# Patient Record
Sex: Male | Born: 1945
Health system: Southern US, Community
[De-identification: ages and names within clinical notes are randomized; demographics above are authoritative.]

## PROBLEM LIST (undated history)

## (undated) DIAGNOSIS — R251 Tremor, unspecified: Secondary | ICD-10-CM

## (undated) DIAGNOSIS — I10 Essential (primary) hypertension: Secondary | ICD-10-CM

## (undated) DIAGNOSIS — E119 Type 2 diabetes mellitus without complications: Secondary | ICD-10-CM

## (undated) DIAGNOSIS — G250 Essential tremor: Secondary | ICD-10-CM

## (undated) HISTORY — DX: Essential (primary) hypertension: I10

## (undated) HISTORY — DX: Essential tremor: G25.0

---

## 2014-08-21 ENCOUNTER — Emergency Department (HOSPITAL_COMMUNITY): Payer: Medicare Other

## 2014-08-21 ENCOUNTER — Encounter (HOSPITAL_COMMUNITY): Payer: Self-pay | Admitting: Emergency Medicine

## 2014-08-21 ENCOUNTER — Emergency Department (HOSPITAL_COMMUNITY)
Admission: EM | Admit: 2014-08-21 | Discharge: 2014-08-21 | Disposition: A | Discharge status: Home / Self Care | Payer: Medicare Other | Attending: Emergency Medicine | Admitting: Emergency Medicine

## 2014-08-21 DIAGNOSIS — L989 Disorder of the skin and subcutaneous tissue, unspecified: Secondary | ICD-10-CM

## 2014-08-21 DIAGNOSIS — Z87891 Personal history of nicotine dependence: Secondary | ICD-10-CM | POA: Insufficient documentation

## 2014-08-21 DIAGNOSIS — Z79899 Other long term (current) drug therapy: Secondary | ICD-10-CM | POA: Diagnosis not present

## 2014-08-21 DIAGNOSIS — Z7982 Long term (current) use of aspirin: Secondary | ICD-10-CM | POA: Diagnosis not present

## 2014-08-21 DIAGNOSIS — K625 Hemorrhage of anus and rectum: Secondary | ICD-10-CM | POA: Insufficient documentation

## 2014-08-21 HISTORY — DX: Tremor, unspecified: R25.1

## 2014-08-21 LAB — CBC WITH DIFFERENTIAL/PLATELET
BASOS PCT: 1 % (ref 0–1)
Basophils Absolute: 0 10*3/uL (ref 0.0–0.1)
Eosinophils Absolute: 0.3 10*3/uL (ref 0.0–0.7)
Eosinophils Relative: 4 % (ref 0–5)
HCT: 41.1 % (ref 39.0–52.0)
Hemoglobin: 13.9 g/dL (ref 13.0–17.0)
LYMPHS PCT: 25 % (ref 12–46)
Lymphs Abs: 1.6 10*3/uL (ref 0.7–4.0)
MCH: 30.7 pg (ref 26.0–34.0)
MCHC: 33.8 g/dL (ref 30.0–36.0)
MCV: 90.7 fL (ref 78.0–100.0)
Monocytes Absolute: 0.6 10*3/uL (ref 0.1–1.0)
Monocytes Relative: 10 % (ref 3–12)
NEUTROS ABS: 3.9 10*3/uL (ref 1.7–7.7)
NEUTROS PCT: 60 % (ref 43–77)
PLATELETS: 144 10*3/uL — AB (ref 150–400)
RBC: 4.53 MIL/uL (ref 4.22–5.81)
RDW: 12.5 % (ref 11.5–15.5)
WBC: 6.4 10*3/uL (ref 4.0–10.5)

## 2014-08-21 LAB — COMPREHENSIVE METABOLIC PANEL
ALT: 28 U/L (ref 17–63)
AST: 23 U/L (ref 15–41)
Albumin: 3.7 g/dL (ref 3.5–5.0)
Alkaline Phosphatase: 67 U/L (ref 38–126)
Anion gap: 7 (ref 5–15)
BILIRUBIN TOTAL: 1.1 mg/dL (ref 0.3–1.2)
BUN: 16 mg/dL (ref 6–20)
CO2: 28 mmol/L (ref 22–32)
Calcium: 8.8 mg/dL — ABNORMAL LOW (ref 8.9–10.3)
Chloride: 106 mmol/L (ref 101–111)
Creatinine, Ser: 0.79 mg/dL (ref 0.61–1.24)
GFR calc Af Amer: >60 mL/min (ref >60)
Glucose, Bld: 105 mg/dL — ABNORMAL HIGH (ref 65–99)
Potassium: 4.1 mmol/L (ref 3.5–5.1)
Sodium: 141 mmol/L (ref 135–145)
Total Protein: 6.6 g/dL (ref 6.5–8.1)

## 2014-08-21 LAB — POC OCCULT BLOOD, ED: Fecal Occult Bld: NEGATIVE

## 2014-08-21 MED ORDER — IOHEXOL 300 MG/ML  SOLN
100.0000 mL | Freq: Once | INTRAMUSCULAR | Status: AC | PRN
  Administered 2014-08-21: 100 mL via INTRAVENOUS

## 2014-08-21 MED ORDER — IOHEXOL 300 MG/ML  SOLN
50.0000 mL | Freq: Once | INTRAMUSCULAR | Status: AC | PRN
  Administered 2014-08-21: 50 mL via ORAL

## 2014-08-21 NOTE — ED Notes (Signed)
Pt reports rectal bleeding intermittently x 1 year, worse over the past 5 years. Pt has not seen PCP.

## 2014-08-21 NOTE — Discharge Instructions (Signed)
Your labs are well within normal limits. Your CT scan is neg for acute changes or problem. There is calcium changes of the aorta. Please see Dr Melina Copa concerning your aorta changes. Please see Dr Oneida Alar for evaluation of your colon. Please return of bleeding not controlled, or any changes or problem.

## 2014-08-21 NOTE — ED Notes (Signed)
Pt drinking PO contrast at this time.

## 2014-08-21 NOTE — ED Provider Notes (Signed)
CSN: [id number]     Arrival date & time 08/21/14  8768 History   First MD Initiated Contact with Patient 08/21/14 [id number]     Chief Complaint  Patient presents with  . Rectal Bleeding     (Consider location/radiation/quality/duration/timing/severity/associated sxs/prior Treatment) HPI Comments: Pt states about 1 year ago he began to notice spots of blood on his  Underclothes, and later noted blood at times when he wipes. This is getting worse over the past 6 weeks. No dizziness, LOC, or pain.  Patient is a 69 y.o. male presenting with hematochezia. The history is provided by the patient and the spouse.  Rectal Bleeding Quality:  Bright red Duration:  12 months Timing:  Intermittent Progression:  Worsening Chronicity:  Chronic Context: not constipation, not diarrhea, not foreign body, not hemorrhoids, not rectal injury and not rectal pain   Relieved by:  Nothing Worsened by:  Nothing tried Ineffective treatments: alcohol dressing. Associated symptoms: no abdominal pain, no dizziness, no fever, no light-headedness, no loss of consciousness and no vomiting   Risk factors: NSAID use   Risk factors: no anticoagulant use and no liver disease     Past Medical History  Diagnosis Date  . Tremors of nervous system    History reviewed. No pertinent past surgical history. History reviewed. No pertinent family history. History  Substance Use Topics  . Smoking status: Former Research scientist (life sciences)  . Smokeless tobacco: Never Used  . Alcohol Use: No    Review of Systems  Constitutional: Negative for fever.  Gastrointestinal: Positive for hematochezia. Negative for vomiting and abdominal pain.       Rectal bleeding.  Neurological: Negative for dizziness, loss of consciousness and light-headedness.  All other systems reviewed and are negative.     Allergies  Review of patient's allergies indicates no known allergies.  Home Medications   Prior to Admission medications   Medication Sig Start Date  End Date Taking? Authorizing Provider  aspirin EC 81 MG tablet Take 81 mg by mouth daily.   Yes Historical Provider, MD  Multiple Vitamin (MULTIVITAMIN WITH MINERALS) TABS tablet Take 1 tablet by mouth daily.   Yes Historical Provider, MD  naproxen sodium (ALEVE) 220 MG tablet Take 440 mg by mouth daily.   Yes Historical Provider, MD  propranolol ER (INDERAL LA) 80 MG 24 hr capsule Take 80 mg by mouth daily.   Yes Historical Provider, MD   BP 149/74 mmHg  Pulse 60  Temp(Src) 97.9 F (36.6 C) (Oral)  Resp 18  Ht 5\' 11"  (1.803 m)  Wt 222 lb (100.699 kg)  BMI 30.98 kg/m2  SpO2 98% Physical Exam  Constitutional: He is oriented to person, place, and time. He appears well-developed and well-nourished.  Non-toxic appearance.  HENT:  Head: Normocephalic.  Right Ear: Tympanic membrane and external ear normal.  Left Ear: Tympanic membrane and external ear normal.  Eyes: EOM and lids are normal. Pupils are equal, round, and reactive to light.  Neck: Normal range of motion. Neck supple. Carotid bruit is not present.  Cardiovascular: Normal rate, regular rhythm, normal heart sounds, intact distal pulses and normal pulses.   Pulmonary/Chest: Breath sounds normal. No respiratory distress.  Abdominal: Soft. Bowel sounds are normal. He exhibits no distension and no mass. There is no tenderness. There is no rebound and no guarding.  Genitourinary: Penis normal. Guaiac negative stool.  No external or internal hemorrhoids. There is a skin lesion at the mid left buttock cheek. No active bleeding. Prostate enlarged, but not tender.  No blood on gloved finger. Stool Neg for Occult blood. No palpable mass.  Musculoskeletal: Normal range of motion. He exhibits no edema.  Lymphadenopathy:       Head (right side): No submandibular adenopathy present.       Head (left side): No submandibular adenopathy present.    He has no cervical adenopathy.  Neurological: He is alert and oriented to person, place, and time.  He has normal strength. No cranial nerve deficit or sensory deficit.  Skin: Skin is warm and dry.  Psychiatric: He has a normal mood and affect. His speech is normal.  Nursing note and vitals reviewed.   ED Course  Pt seen with me by Dr Rogene Houston.  Procedures (including critical care time) Labs Review Labs Reviewed - No data to display  Imaging Review No results found.   EKG Interpretation None      MDM  There no orthostatic blood pressure and pulse changes. Vital signs are well within normal limits. Principal metabolic panel is well within normal limits. Complete blood count is also well within normal limits. A CT scan of the abdomen and pelvis with contrast Reveals cholelithiasis without a couple getting factor. Diffuse aortoiliac calcification without aneurysmal changes is noted. And there is no acute abnormality noted on the CT.  I have discussed these findings of the examination is well as the labs and imaging with the patient and the patient's wife in terms which they understand. I feel that it is safe for the patient to return home. I have asked the patient to see Dr. Oneida Alar for colonoscopy, as the patient has not had one. I have also asked the patient to discuss the calcifications involving the aorta with Dr. Melina Copa his primary physician. The patient is to return to the emergency department immediately if any changes, problems, or concerns. Family is in agreement with this discharge plan.    Final diagnoses:  None    **I have reviewed nursing notes, vital signs, and all appropriate lab and imaging results for this patient.Lily Kocher, PA-C 08/21/14 [place], MD 08/21/14 [phone]

## 2014-08-22 ENCOUNTER — Telehealth: Payer: Self-pay | Admitting: *Deleted

## 2014-08-22 NOTE — Telephone Encounter (Signed)
I called pt no answer LMOM, I did schedule pt for 09/11/14 with Randall Hiss and appt letter mailed

## 2014-08-22 NOTE — Telephone Encounter (Signed)
Pt's wife called stating the ER told the pt to call here and schedule a TCS can we see this pt?. [phone]

## 2014-08-22 NOTE — Telephone Encounter (Signed)
Pt called back and confirmed appt.

## 2014-08-22 NOTE — Telephone Encounter (Signed)
No previous GI in system, seen in ER for rectal bleeding felt to be due to anal lesion and reccommended GI follow-up per ER MD.  Please schedule patient for an available (non-urgent) slot. Thanks!

## 2014-09-11 ENCOUNTER — Encounter: Payer: Self-pay | Admitting: Nurse Practitioner

## 2014-09-11 ENCOUNTER — Ambulatory Visit (INDEPENDENT_AMBULATORY_CARE_PROVIDER_SITE_OTHER): Payer: Medicare Other | Admitting: Nurse Practitioner

## 2014-09-11 ENCOUNTER — Telehealth: Payer: Self-pay

## 2014-09-11 ENCOUNTER — Other Ambulatory Visit: Payer: Self-pay

## 2014-09-11 VITALS — BP 119/66 | HR 55 | Temp 97.5°F | Ht 71.0 in | Wt 225.2 lb

## 2014-09-11 DIAGNOSIS — K625 Hemorrhage of anus and rectum: Secondary | ICD-10-CM | POA: Diagnosis not present

## 2014-09-11 DIAGNOSIS — Z1211 Encounter for screening for malignant neoplasm of colon: Secondary | ICD-10-CM | POA: Diagnosis not present

## 2014-09-11 MED ORDER — NA SULFATE-K SULFATE-MG SULF 17.5-3.13-1.6 GM/177ML PO SOLN: 1.0000 | ORAL | Status: DC

## 2014-09-11 NOTE — Assessment & Plan Note (Signed)
69 year old male presents on recommendation of emergency room physician for outpatient screening colonoscopy as he has never had one before. He is generally asymptomatic from a GI standpoint. Was seen in the emergency room for rectal bleeding which was felt to be due to a found perianal skin lesion. He has not had any recurrent bleeding, no overt GI bleeding, stool negative for heme, labs all within normal limits. At this point we will proceed with initial screening colonoscopy on this gentleman.  Proceed with colonoscopy with Dr. Oneida Alar in the near future. The risks, benefits, and alternatives have been discussed in detail with the patient. They state understanding and desire to proceed.   The patient is not on any anticoagulants, antidepressants, anxiolytics, her chronic pain medications. Denies alcohol and drug use. Conscious sedation should be adequate for this procedure.

## 2014-09-11 NOTE — Telephone Encounter (Signed)
T/C from [employer]  [phone]. Suprep not covered by Ins.

## 2014-09-11 NOTE — Patient Instructions (Signed)
1. We will schedule your procedure for you. 2. Further recommendations to be based on the results of your procedure. 

## 2014-09-11 NOTE — Progress Notes (Signed)
CC'ED TO PCP 

## 2014-09-11 NOTE — Progress Notes (Signed)
    Primary Care Physician:  Octavio Graves, DO Primary Gastroenterologist:  Dr. Oneida Alar  Chief Complaint  Patient presents with  . Follow-up    HPI:   69 year old male presents for follow-up after emergency room visit. The patient was seen for rectal bleeding and was felt on exam by the ER physician to be due to it left by Dr. anal lesion. CMP and CBC were within normal limits, no overt GI bleeding, no anemia, and heme-negative stool. He was recommended to follow up with his regular doctor for continued care as well as GI for colonoscopy as patient has not had one and is currently age 89.  Today he states he has not had any further bleeding since going to the ED. He confirms he has not had a colonoscopy yet. Denies abdominal pain, N/V, hematochezia, melena, fevers of unknown origin, unintentional weight loss, changes in bowel habits. Has a bowel movement daily which is consistent with Bristol scale 4. Denies chest pain, dyspnea, dizziness, lightheadedness, syncope, near syncope. Denies any other upper or lower GI symptoms.  Past Medical History  Diagnosis Date  . Tremors of nervous system     No past surgical history on file.  Current Outpatient Prescriptions  Medication Sig Dispense Refill  . aspirin EC 81 MG tablet Take 81 mg by mouth daily.    . Multiple Vitamin (MULTIVITAMIN WITH MINERALS) TABS tablet Take 1 tablet by mouth daily.    . naproxen sodium (ALEVE) 220 MG tablet Take 440 mg by mouth daily.    . propranolol ER (INDERAL LA) 80 MG 24 hr capsule Take 80 mg by mouth daily.     No current facility-administered medications for this visit.    Allergies as of 09/11/2014  . (No Known Allergies)    No family history on file.  History   Social History  . Marital Status: Unknown    Spouse Name: N/A  . Number of Children: N/A  . Years of Education: N/A   Occupational History  . Not on file.   Social History Main Topics  . Smoking status: Former Research scientist (life sciences)  . Smokeless  tobacco: Never Used  . Alcohol Use: No  . Drug Use: No  . Sexual Activity: Not on file   Other Topics Concern  . Not on file   Social History Narrative    Review of Systems: 10 point ROS negative except as per HPI    Physical Exam: BP 119/66 mmHg  Pulse 55  Temp(Src) 97.5 F (36.4 C)  Ht 5\' 11"  (1.803 m)  Wt 225 lb 3.2 oz (102.15 kg)  BMI 31.42 kg/m2 General:   Alert and oriented. Pleasant and cooperative. Well-nourished and well-developed.  Head:  Normocephalic and atraumatic. Eyes:  Without icterus, sclera clear and conjunctiva pink.  Ears:  Normal auditory acuity. Cardiovascular:  S1, S2 present without murmurs appreciated. Normal pulses noted. Extremities without clubbing or edema. Respiratory:  Clear to auscultation bilaterally. No wheezes, rales, or rhonchi. No distress.  Gastrointestinal:  +BS, rounded, soft, non-tender and non-distended. No HSM noted. No guarding or rebound. No masses appreciated.  Rectal:  Deferred  Neurologic:  Alert and oriented x4;  grossly normal neurologically. Psych:  Alert and cooperative. Normal mood and affect. Heme/Lymph/Immune: No excessive bruising noted.    09/11/2014 8:47 AM

## 2014-09-11 NOTE — Assessment & Plan Note (Signed)
Patient seen in the emergency room for rectal bleeding. Stool card was negative for heme. Physical exam revealed a perianal lesion which was felt to be the source of toilet tissue hematochezia. It was noted however that the patient had never had a colonoscopy and he was subsequently recommended for screening colonoscopy as an outpatient. The patient has not had any recurrent bleeding since being to the emergency room. He is generally asymptomatic from a GI standpoint. No red flag/warning signs or symptoms.

## 2014-09-13 NOTE — Telephone Encounter (Signed)
PT'S WIFE WILL COME BY TO PICK UP SAMPLE

## 2014-09-21 ENCOUNTER — Encounter (HOSPITAL_COMMUNITY): Payer: Self-pay | Admitting: *Deleted

## 2014-09-21 ENCOUNTER — Ambulatory Visit (HOSPITAL_COMMUNITY)
Admission: RE | Admit: 2014-09-21 | Discharge: 2014-09-21 | Disposition: A | Discharge status: Home / Self Care | Payer: Medicare Other | Source: Ambulatory Visit | Attending: Gastroenterology | Admitting: Gastroenterology

## 2014-09-21 ENCOUNTER — Encounter (HOSPITAL_COMMUNITY)
Admission: RE | Disposition: A | Discharge status: Home / Self Care | Payer: Self-pay | Source: Ambulatory Visit | Attending: Gastroenterology

## 2014-09-21 DIAGNOSIS — Z791 Long term (current) use of non-steroidal anti-inflammatories (NSAID): Secondary | ICD-10-CM | POA: Diagnosis not present

## 2014-09-21 DIAGNOSIS — D123 Benign neoplasm of transverse colon: Secondary | ICD-10-CM

## 2014-09-21 DIAGNOSIS — D128 Benign neoplasm of rectum: Secondary | ICD-10-CM | POA: Diagnosis not present

## 2014-09-21 DIAGNOSIS — Z7982 Long term (current) use of aspirin: Secondary | ICD-10-CM | POA: Insufficient documentation

## 2014-09-21 DIAGNOSIS — Z1211 Encounter for screening for malignant neoplasm of colon: Secondary | ICD-10-CM | POA: Insufficient documentation

## 2014-09-21 DIAGNOSIS — Z87891 Personal history of nicotine dependence: Secondary | ICD-10-CM | POA: Diagnosis not present

## 2014-09-21 DIAGNOSIS — K621 Rectal polyp: Secondary | ICD-10-CM | POA: Diagnosis not present

## 2014-09-21 DIAGNOSIS — K648 Other hemorrhoids: Secondary | ICD-10-CM | POA: Diagnosis not present

## 2014-09-21 DIAGNOSIS — Q438 Other specified congenital malformations of intestine: Secondary | ICD-10-CM | POA: Diagnosis not present

## 2014-09-21 HISTORY — PX: COLONOSCOPY: SHX5424

## 2014-09-21 SURGERY — COLONOSCOPY
Anesthesia: Moderate Sedation

## 2014-09-21 MED ORDER — MIDAZOLAM HCL 5 MG/5ML IJ SOLN
INTRAMUSCULAR | Status: DC | PRN
  Administered 2014-09-21 (×2): 2 mg via INTRAVENOUS

## 2014-09-21 MED ORDER — MIDAZOLAM HCL 5 MG/5ML IJ SOLN
INTRAMUSCULAR | Status: AC
  Filled 2014-09-21: qty 10

## 2014-09-21 MED ORDER — MEPERIDINE HCL 100 MG/ML IJ SOLN
INTRAMUSCULAR | Status: DC | PRN
  Administered 2014-09-21 (×2): 25 mg via INTRAVENOUS

## 2014-09-21 MED ORDER — MEPERIDINE HCL 100 MG/ML IJ SOLN
INTRAMUSCULAR | Status: AC
  Filled 2014-09-21: qty 2

## 2014-09-21 MED ORDER — SODIUM CHLORIDE 0.9 % IV SOLN
INTRAVENOUS | Status: DC
  Administered 2014-09-21: 10:00:00 via INTRAVENOUS

## 2014-09-21 MED ORDER — STERILE WATER FOR IRRIGATION IR SOLN
Status: DC | PRN
  Administered 2014-09-21: 12:00:00

## 2014-09-21 NOTE — H&P (Signed)
  Primary Care Physician:  Octavio Graves, DO Primary Gastroenterologist:  Dr. Oneida Alar  Pre-Procedure History & Physical: HPI:  Robert Faulkner is a 69 y.o. male here for [place].  Past Medical History  Diagnosis Date  . Tremors of nervous system    History reviewed. No pertinent past surgical history.  Prior to Admission medications   Medication Sig Start Date End Date Taking? Authorizing Provider  aspirin EC 81 MG tablet Take 81 mg by mouth daily.   Yes Historical Provider, MD  Multiple Vitamin (MULTIVITAMIN WITH MINERALS) TABS tablet Take 1 tablet by mouth daily.   Yes Historical Provider, MD  Na Sulfate-K Sulfate-Mg Sulf (SUPREP BOWEL PREP) SOLN Take 1 kit by mouth as directed. 09/11/14  Yes Danie Binder, MD  naproxen sodium (ALEVE) 220 MG tablet Take 440 mg by mouth daily.   Yes Historical Provider, MD  propranolol ER (INDERAL LA) 80 MG 24 hr capsule Take 80 mg by mouth daily.   Yes Historical Provider, MD   Allergies as of 09/11/2014  . (No Known Allergies)    Family History  Problem Relation Age of Onset  . Colon cancer Neg Hx     History   Social History  . Marital Status: Unknown    Spouse Name: N/A  . Number of Children: N/A  . Years of Education: N/A   Occupational History  . Not on file.   Social History Main Topics  . Smoking status: Former Smoker -- 2.00 packs/day for 11 years    Quit date: 09/20/1977  . Smokeless tobacco: Never Used  . Alcohol Use: No  . Drug Use: No  . Sexual Activity: Not on file   Other Topics Concern  . Not on file   Social History Narrative   Review of Systems: See HPI, otherwise negative ROS  Physical Exam: BP 155/69 mmHg  Pulse 59  Temp(Src) 98.7 F (37.1 C) (Oral)  Resp 10  SpO2 99% General:   Alert,  pleasant and cooperative in NAD Head:  Normocephalic and atraumatic. Neck:  Supple; Lungs:  Clear throughout to auscultation.    Heart:  Regular rate and rhythm. Abdomen:  Soft, nontender and  nondistended. Normal bowel sounds, without guarding, and without rebound.   Neurologic:  Alert and  oriented x4;  grossly normal neurologically.  Impression/Plan:    SCREENING  Plan:  1. TCS TODAY

## 2014-09-21 NOTE — Discharge Instructions (Signed)
You had 2 small polyps removed. You have small internal hemorrhoids and diverticulosis IN YOUR LEFT COLON.   FOLLOW A HIGH FIBER DIET. AVOID ITEMS THAT CAUSE BLOATING. SEE INFO BELOW.  YOUR BIOPSY RESULTS WILL BE AVAILABLE IN MY CHART AFTER AUG 1 AND MY OFFICE WILL CONTACT YOU IN 10-14 DAYS WITH YOUR RESULTS.    Next colonoscopy in 10-15 years IF THE BENEFITS OUTWEIGH THE RISKS.   Colonoscopy Care After Read the instructions outlined below and refer to this sheet in the next week. These discharge instructions provide you with general information on caring for yourself after you leave the hospital. While your treatment has been planned according to the most current medical practices available, unavoidable complications occasionally occur. If you have any problems or questions after discharge, call DR. Keionna Kinnaird, [phone].  ACTIVITY  You may resume your regular activity, but move at a slower pace for the next 24 hours.   Take frequent rest periods for the next 24 hours.   Walking will help get rid of the air and reduce the bloated feeling in your belly (abdomen).   No driving for 24 hours (because of the medicine (anesthesia) used during the test).   You may shower.   Do not sign any important legal documents or operate any machinery for 24 hours (because of the anesthesia used during the test).    NUTRITION  Drink plenty of fluids.   You may resume your normal diet as instructed by your doctor.   Begin with a light meal and progress to your normal diet. Heavy or fried foods are harder to digest and may make you feel sick to your stomach (nauseated).   Avoid alcoholic beverages for 24 hours or as instructed.    MEDICATIONS  You may resume your normal medications.   WHAT YOU CAN EXPECT TODAY  Some feelings of bloating in the abdomen.   Passage of more gas than usual.   Spotting of blood in your stool or on the toilet paper  .  IF YOU HAD POLYPS REMOVED DURING THE  COLONOSCOPY:  Eat a soft diet IF YOU HAVE NAUSEA, BLOATING, ABDOMINAL PAIN, OR VOMITING.    FINDING OUT THE RESULTS OF YOUR TEST Not all test results are available during your visit. DR. Oneida Alar WILL CALL YOU WITHIN 14 DAYS OF YOUR PROCEDUE WITH YOUR RESULTS. Do not assume everything is normal if you have not heard from DR. Brecken Walth, CALL HER OFFICE AT [phone].  SEEK IMMEDIATE MEDICAL ATTENTION AND CALL THE OFFICE: [phone] IF:  You have more than a spotting of blood in your stool.   Your belly is swollen (abdominal distention).   You are nauseated or vomiting.   You have a temperature over 101F.   You have abdominal pain or discomfort that is severe or gets worse throughout the day.  Polyps, Colon  A polyp is extra tissue that grows inside your body. Colon polyps grow in the large intestine. The large intestine, also called the colon, is part of your digestive system. It is a long, hollow tube at the end of your digestive tract where your body makes and stores stool. Most polyps are not dangerous. They are benign. This means they are not cancerous. But over time, some types of polyps can turn into cancer. Polyps that are smaller than a pea are usually not harmful. But larger polyps could someday become or may already be cancerous. To be safe, doctors remove all polyps and test them.   PREVENTION There  is not one sure way to prevent polyps. You might be able to lower your risk of getting them if you:  Eat more fruits and vegetables and less fatty food.   Do not smoke.   Avoid alcohol.   Exercise every day.   Lose weight if you are overweight.   Eating more calcium and folate can also lower your risk of getting polyps. Some foods that are rich in calcium are milk, cheese, and broccoli. Some foods that are rich in folate are chickpeas, kidney beans, and spinach.   High-Fiber Diet A high-fiber diet changes your normal diet to include more whole grains, legumes, fruits, and  vegetables. Changes in the diet involve replacing refined carbohydrates with unrefined foods. The calorie level of the diet is essentially unchanged. The Dietary Reference Intake (recommended amount) for adult males is 38 grams per day. For adult females, it is 25 grams per day. Pregnant and lactating women should consume 28 grams of fiber per day. Fiber is the intact part of a plant that is not broken down during digestion. Functional fiber is fiber that has been isolated from the plant to provide a beneficial effect in the body. PURPOSE  Increase stool bulk.   Ease and regulate bowel movements.   Lower cholesterol.  REDUCE RISK OF COLON CANCER  INDICATIONS THAT YOU NEED MORE FIBER  Constipation and hemorrhoids.   Uncomplicated diverticulosis (intestine condition) and irritable bowel syndrome.   Weight management.   As a protective measure against hardening of the arteries (atherosclerosis), diabetes, and cancer.   GUIDELINES FOR INCREASING FIBER IN THE DIET  Start adding fiber to the diet slowly. A gradual increase of about 5 more grams (2 slices of whole-wheat bread, 2 servings of most fruits or vegetables, or 1 bowl of high-fiber cereal) per day is best. Too rapid an increase in fiber may result in constipation, flatulence, and bloating.   Drink enough water and fluids to keep your urine clear or pale yellow. Water, juice, or caffeine-free drinks are recommended. Not drinking enough fluid may cause constipation.   Eat a variety of high-fiber foods rather than one type of fiber.   Try to increase your intake of fiber through using high-fiber foods rather than fiber pills or supplements that contain small amounts of fiber.   The goal is to change the types of food eaten. Do not supplement your present diet with high-fiber foods, but replace foods in your present diet.   INCLUDE A VARIETY OF FIBER SOURCES  Replace refined and processed grains with whole grains, canned fruits with  fresh fruits, and incorporate other fiber sources. White rice, white breads, and most bakery goods contain little or no fiber.   Brown whole-grain rice, buckwheat oats, and many fruits and vegetables are all good sources of fiber. These include: broccoli, Brussels sprouts, cabbage, cauliflower, beets, sweet potatoes, white potatoes (skin on), carrots, tomatoes, eggplant, squash, berries, fresh fruits, and dried fruits.   Cereals appear to be the richest source of fiber. Cereal fiber is found in whole grains and bran. Bran is the fiber-rich outer coat of cereal grain, which is largely removed in refining. In whole-grain cereals, the bran remains. In breakfast cereals, the largest amount of fiber is found in those with "bran" in their names. The fiber content is sometimes indicated on the label.   You may need to include additional fruits and vegetables each day.   In baking, for 1 cup white flour, you may use the following substitutions:  1 cup whole-wheat flour minus 2 tablespoons.   1/2 cup white flour plus 1/2 cup whole-wheat flour.   Diverticulosis Diverticulosis is a common condition that develops when small pouches (diverticula) form in the wall of the colon. The risk of diverticulosis increases with age. It happens more often in people who eat a low-fiber diet. Most individuals with diverticulosis have no symptoms. Those individuals with symptoms usually experience belly (abdominal) pain, constipation, or loose stools (diarrhea).  HOME CARE INSTRUCTIONS  Increase the amount of fiber in your diet as directed by your caregiver or dietician. This may reduce symptoms of diverticulosis.   Drink at least 6 to 8 glasses of water each day to prevent constipation.   Try not to strain when you have a bowel movement.   Avoiding nuts and seeds to prevent complications is NOT NECESSARY.     FOODS HAVING HIGH FIBER CONTENT INCLUDE:  Fruits. Apple, peach, pear, tangerine, raisins, prunes.    Vegetables. Brussels sprouts, asparagus, broccoli, cabbage, carrot, cauliflower, romaine lettuce, spinach, summer squash, tomato, winter squash, zucchini.   Starchy Vegetables. Baked beans, kidney beans, lima beans, split peas, lentils, potatoes (with skin).   Grains. Whole wheat bread, brown rice, bran flake cereal, plain oatmeal, white rice, shredded wheat, bran muffins.     Hemorrhoids Hemorrhoids are dilated (enlarged) veins around the rectum. Sometimes clots will form in the veins. This makes them swollen and painful. These are called thrombosed hemorrhoids. Causes of hemorrhoids include:  Constipation.   Straining to have a bowel movement.   HEAVY LIFTING  HOME CARE INSTRUCTIONS  Eat a well balanced diet and drink 6 to 8 glasses of water every day to avoid constipation. You may also use a bulk laxative.   Avoid straining to have bowel movements.   Keep anal area dry and clean.   Do not use a donut shaped pillow or sit on the toilet for long periods. This increases blood pooling and pain.   Move your bowels when your body has the urge; this will require less straining and will decrease pain and pressure.

## 2014-09-21 NOTE — Op Note (Signed)
[facility] [address] [place], [address]   COLONOSCOPY PROCEDURE REPORT  PATIENT: Robert Faulkner, Robert Faulkner  MR#: [id number] BIRTHDATE: Aug 22, 1945 , 69  yrs. old GENDER: male ENDOSCOPIST: Danie Binder, MD REFERRED HR:CBULAGT Butler PROCEDURE DATE:  30-Sep-2014 PROCEDURE:   Colonoscopy, screening INDICATIONS:average risk patient for colon cancer. MEDICATIONS: Demerol 50 mg IV and Versed 4 mg IV  DESCRIPTION OF PROCEDURE:    Physical exam was performed.  Informed consent was obtained from the patient after explaining the benefits, risks, and alternatives to procedure.  The patient was connected to monitor and placed in left lateral position. Continuous oxygen was provided by nasal cannula and IV medicine administered through an indwelling cannula.  After administration of sedation and rectal exam, the patients rectum was intubated and the EC-[id number] ([id number])  colonoscope was advanced under direct visualization to the ileum.  The scope was removed slowly by carefully examining the color, texture, anatomy, and integrity mucosa on the way out.  The patient was recovered in endoscopy and discharged home in satisfactory condition. Estimated blood loss is zero unless otherwise noted in this procedure report.    COLON FINDINGS: The colon was redundant.  , There was moderate diverticulosis noted in the descending colon and sigmoid colon with associated tortuosity and muscular hypertrophy.  , Two sessile polyps ranging from 2 to 26mm in size were found in the rectum and at the hepatic flexure.  A polypectomy was performed with cold forceps.  , and Small internal hemorrhoids were found.  PREP QUALITY: good.  CECAL W/D TIME: 11       minutes COMPLICATIONS: None  ENDOSCOPIC IMPRESSION: 1.   The LEFT colon IS SLIGHTLY redundant 2.   Moderate diverticulosis in the descending colon and sigmoid colon 3.   Two polyps REMOVED 4.   Small internal  hemorrhoids  RECOMMENDATIONS: AWAIT BIOPSY HIGH FIBER DIET NEXT TCS IN 10-15 YEARS IF THE BENEFITS OUTWEIGH THE RISKS REFERRAL MADETO DERMATOLLOGY FOR LESION ON BUTTOCK.    _______________________________ eSignedDanie Binder, MD 2014/09/30 2:14 PM   CPT CODES: ICD CODES:  The ICD and CPT codes recommended by this software are interpretations from the data that the clinical staff has captured with the software.  The verification of the translation of this report to the ICD and CPT codes and modifiers is the sole responsibility of the health care institution and practicing physician where this report was generated.  [employer] [place]. will not be held responsible for the validity of the ICD and CPT codes included on this report.  AMA assumes no liability for data contained or not contained herein. CPT is a Designer, television/film set of the [employer].

## 2014-09-24 ENCOUNTER — Other Ambulatory Visit: Payer: Self-pay

## 2014-09-24 DIAGNOSIS — L989 Disorder of the skin and subcutaneous tissue, unspecified: Secondary | ICD-10-CM

## 2014-09-25 ENCOUNTER — Encounter (HOSPITAL_COMMUNITY): Payer: Self-pay | Admitting: Gastroenterology

## 2014-09-28 ENCOUNTER — Telehealth: Payer: Self-pay | Admitting: Gastroenterology

## 2014-09-28 NOTE — Telephone Encounter (Signed)
Please call pt. He had A simple adenoma AND A HYPERPLASTIC POLYP removed.  FOLLOW A HIGH FIBER DIET. AVOID ITEMS THAT CAUSE BLOATING.   Next colonoscopy in 10-15 years IF THE BENEFITS OUTWEIGH THE RISKS.

## 2014-09-28 NOTE — Telephone Encounter (Signed)
Tried to call with no answer  

## 2014-10-01 NOTE — Telephone Encounter (Signed)
Reminder in epic °

## 2014-10-01 NOTE — Telephone Encounter (Signed)
Pt wife called and she is aware of results

## 2014-10-01 NOTE — Telephone Encounter (Signed)
LMOM for a return call. Letter mailed also to call.

## 2015-05-21 DIAGNOSIS — R000 Tachycardia, unspecified: Secondary | ICD-10-CM | POA: Diagnosis not present

## 2015-05-21 DIAGNOSIS — G250 Essential tremor: Secondary | ICD-10-CM | POA: Diagnosis not present

## 2015-05-21 DIAGNOSIS — Z0000 Encounter for general adult medical examination without abnormal findings: Secondary | ICD-10-CM | POA: Diagnosis not present

## 2015-05-21 DIAGNOSIS — M1990 Unspecified osteoarthritis, unspecified site: Secondary | ICD-10-CM | POA: Diagnosis not present

## 2015-07-15 DIAGNOSIS — Z125 Encounter for screening for malignant neoplasm of prostate: Secondary | ICD-10-CM | POA: Diagnosis not present

## 2015-07-15 DIAGNOSIS — R000 Tachycardia, unspecified: Secondary | ICD-10-CM | POA: Diagnosis not present

## 2015-07-15 DIAGNOSIS — Z0000 Encounter for general adult medical examination without abnormal findings: Secondary | ICD-10-CM | POA: Diagnosis not present

## 2015-07-15 DIAGNOSIS — E785 Hyperlipidemia, unspecified: Secondary | ICD-10-CM | POA: Diagnosis not present

## 2015-07-15 DIAGNOSIS — M545 Low back pain: Secondary | ICD-10-CM | POA: Diagnosis not present

## 2015-07-15 DIAGNOSIS — G250 Essential tremor: Secondary | ICD-10-CM | POA: Diagnosis not present

## 2015-08-02 DIAGNOSIS — M545 Low back pain: Secondary | ICD-10-CM | POA: Diagnosis not present

## 2015-08-23 DIAGNOSIS — M7021 Olecranon bursitis, right elbow: Secondary | ICD-10-CM | POA: Diagnosis not present

## 2015-08-23 DIAGNOSIS — M25521 Pain in right elbow: Secondary | ICD-10-CM | POA: Diagnosis not present

## 2015-08-23 DIAGNOSIS — L0390 Cellulitis, unspecified: Secondary | ICD-10-CM | POA: Diagnosis not present

## 2015-08-23 DIAGNOSIS — S59909A Unspecified injury of unspecified elbow, initial encounter: Secondary | ICD-10-CM | POA: Diagnosis not present

## 2015-08-23 DIAGNOSIS — R000 Tachycardia, unspecified: Secondary | ICD-10-CM | POA: Diagnosis not present

## 2015-08-23 DIAGNOSIS — M7989 Other specified soft tissue disorders: Secondary | ICD-10-CM | POA: Diagnosis not present

## 2015-10-18 ENCOUNTER — Encounter: Payer: Self-pay | Admitting: Physician Assistant

## 2015-10-18 ENCOUNTER — Ambulatory Visit (INDEPENDENT_AMBULATORY_CARE_PROVIDER_SITE_OTHER): Payer: Medicare Other | Admitting: Physician Assistant

## 2015-10-18 VITALS — BP 110/64 | HR 66 | Temp 97.3°F | Ht 71.0 in | Wt 223.8 lb

## 2015-10-18 DIAGNOSIS — G250 Essential tremor: Secondary | ICD-10-CM | POA: Diagnosis not present

## 2015-10-18 DIAGNOSIS — M71021 Abscess of bursa, right elbow: Secondary | ICD-10-CM

## 2015-10-18 DIAGNOSIS — M7021 Olecranon bursitis, right elbow: Secondary | ICD-10-CM | POA: Diagnosis not present

## 2015-10-18 DIAGNOSIS — M71029 Abscess of bursa, unspecified elbow: Secondary | ICD-10-CM | POA: Insufficient documentation

## 2015-10-18 DIAGNOSIS — Z6831 Body mass index (BMI) 31.0-31.9, adult: Secondary | ICD-10-CM | POA: Diagnosis not present

## 2015-10-18 DIAGNOSIS — M7020 Olecranon bursitis, unspecified elbow: Secondary | ICD-10-CM | POA: Insufficient documentation

## 2015-10-18 MED ORDER — NAPROXEN SODIUM 220 MG PO TABS: 440.0000 mg | ORAL_TABLET | Freq: Every day | ORAL | Status: DC

## 2015-10-18 MED ORDER — SULFAMETHOXAZOLE-TRIMETHOPRIM 800-160 MG PO TABS: 1.0000 | ORAL_TABLET | Freq: Two times a day (BID) | ORAL | 0 refills | Status: AC

## 2015-10-18 MED ORDER — PROPRANOLOL HCL ER 120 MG PO CP24: 120.0000 mg | ORAL_CAPSULE | Freq: Every day | ORAL | 12 refills | Status: DC

## 2015-10-18 NOTE — Patient Instructions (Signed)
Elbow Bursitis  Elbow bursitis is inflammation of the fluid-filled sac (bursa) between the tip of your elbow bone (olecranon) and your skin. Elbow bursitis may also be called olecranon bursitis.  Normally, the olecranon bursa has only a small amount of fluid in it to cushion and protect your elbow bone. Elbow bursitis causes fluid to build up inside the bursa. Over time, this swelling and inflammation can cause pain when you bend or lean on your elbow.   CAUSES  Elbow bursitis may be caused by:    Elbow injury (acute trauma).   Leaning on hard surfaces for long periods of time.   Infection from an injury that breaks the skin near your elbow.   A bone growth (spur) that forms at the tip of your elbow.   A medical condition that causes inflammation in your body, such as gout or rheumatoid arthritis.   The cause may also be unknown.   SIGNS AND SYMPTOMS   The first sign of elbow bursitis is usually swelling over the tip of your elbow. This can grow to be the size of a golf ball. This may start suddenly or develop gradually. You may also have:   Pain when bending or leaning on your elbow.   Restricted movement of your elbow.   If your bursitis is caused by an infection, symptoms may also include:   Redness, warmth, and tenderness of the elbow.   Drainage of pus from the swollen area over your elbow, if the skin breaks open.  DIAGNOSIS   Your health care provider may be able to diagnose elbow bursitis based on your signs and symptoms, especially if you have recently been injured. Your health care provider will also do a physical exam. This may include:   X-rays to look for a bone spur or a bone fracture.   Draining fluid from the bursa to test it for infection.   Blood tests to rule out gout or rheumatoid arthritis.  TREATMENT   Treatment for elbow bursitis depends on the cause. Treatment may include:   Medicines. These may include:    Over-the-counter medicines to relieve pain and inflammation.     Antibiotic medicines to fight infection.    Injections of anti-inflammatory medicines (steroids).   Wrapping your elbow with a bandage.   Draining fluid from the bursa.   Wearing elbow pads.   If your bursitis does not get better with treatment, surgery may be needed to remove the bursa.   HOME CARE INSTRUCTIONS    Take medicines only as directed by your health care provider.   If you were prescribed an antibiotic medicine, finish all of it even if you start to feel better.   If your bursitis is caused by an injury, rest your elbow and wear your bandage as directed by your health care provider. You may alsoapply ice to the injured area as directed by your health care provider:   Put ice in a plastic bag.   Place a towel between your skin and the bag.   Leave the ice on for 20 minutes, 2-3 times per day.   Avoid any activities that cause elbow pain.   Use elbow pads or elbow wraps to cushion your elbow.  SEEK MEDICAL CARE IF:   You have a fever.    Your symptoms do not get better with treatment.   Your pain or swelling gets worse.   Your elbow pain or swelling goes away and then returns.   You have   drainage of pus from the swollen area over your elbow.     This information is not intended to replace advice given to you by your health care provider. Make sure you discuss any questions you have with your health care provider.     Document Released: 03/11/2006 Document Revised: 03/02/2014 Document Reviewed: 10/18/2013  Elsevier Interactive Patient Education 2016 Elsevier Inc.

## 2015-10-18 NOTE — Progress Notes (Signed)
BP 110/64 (BP Location: Left Arm, Patient Position: Sitting, Cuff Size: Large)   Pulse 66   Temp 97.3 F (36.3 C) (Oral)   Ht 5\' 11"  (1.803 m)   Wt 223 lb 12.8 oz (101.5 kg)   BMI 31.21 kg/m    Subjective:    Patient ID: Robert Faulkner, male    DOB: 03-28-1945, 70 y.o.   MRN: [id number]  HPI: Robert Faulkner is a 70 y.o. male presenting on 10/18/2015 for Joint Swelling (right )   HPI this patient comes in with a recurrence of his olecranon bursitis. At this time there is more redness and tenderness to the specific bursa. He had taken prednisone before and it was greatly resolved after using this. He has continued to work. 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 In addition he notes that his essential tremor has gotten worse. He is currently on propranolol extended release 80 mg. In the past he has tried primidone and caused him to be too drowsy. He prefers not to try that again we discussed the possibility of using at doing a much slower taper in the future. At this time we will try increasing the propranolol to 120 mg.    Relevant past medical, surgical, family and social history reviewed and updated as indicated. Interim medical history since our last visit reviewed. Allergies and medications reviewed and updated. DATA REVIEWED: CHART IN EPIC  Review of Systems  Constitutional: Negative.  Negative for appetite change and fatigue.  HENT: Negative.   Eyes: Negative.  Negative for pain and visual disturbance.  Respiratory: Negative.  Negative for cough, chest tightness, shortness of breath and wheezing.   Cardiovascular: Negative.  Negative for chest pain, palpitations and leg swelling.  Gastrointestinal: Negative.  Negative for abdominal pain, diarrhea, nausea and vomiting.  Endocrine: Negative.   Genitourinary: Negative.   Musculoskeletal: Positive for arthralgias and joint swelling.  Skin: Positive for color change, rash and wound.   Neurological: Negative.  Negative for weakness, numbness and headaches.  Psychiatric/Behavioral: Negative.     Per HPI unless specifically indicated above     Medication List       Accurate as of 10/18/15  9:50 AM. Always use your most recent med list.          aspirin EC 81 MG tablet Take 81 mg by mouth daily.   multivitamin with minerals Tabs tablet Take 1 tablet by mouth daily.   naproxen sodium 220 MG tablet Commonly known as:  ALEVE Take 2 tablets (440 mg total) by mouth daily. TAKE 2 tab BID for the flare up of the bursitis   propranolol ER 120 MG 24 hr capsule Commonly known as:  INDERAL LA Take 1 capsule (120 mg total) by mouth daily.   sulfamethoxazole-trimethoprim 800-160 MG tablet Commonly known as:  BACTRIM DS Take 1 tablet by mouth 2 (two) times daily.          Objective:    BP 110/64 (BP Location: Left Arm, Patient Position: Sitting, Cuff Size: Large)   Pulse 66   Temp 97.3 F (36.3 C) (Oral)   Ht 5\' 11"  (1.803 m)   Wt 223 lb 12.8 oz (101.5 kg)   BMI 31.21 kg/m   No Known Allergies  Wt Readings from Last 3 Encounters:  10/18/15 223 lb 12.8 oz (101.5 kg)  09/11/14 225 lb 3.2 oz (102.2 kg)  08/21/14 222 lb (100.7 kg)    Physical  Exam  Constitutional: He appears well-developed and well-nourished.  HENT:  Head: Normocephalic and atraumatic.  Eyes: Conjunctivae and EOM are normal. Pupils are equal, round, and reactive to light.  Neck: Normal range of motion. Neck supple.  Cardiovascular: Normal rate, regular rhythm and normal heart sounds.   Pulmonary/Chest: Effort normal and breath sounds normal.  Abdominal: Soft. Bowel sounds are normal.  Musculoskeletal: Normal range of motion. He exhibits edema, tenderness and deformity.       Right elbow: He exhibits swelling, effusion and deformity. He exhibits normal range of motion. Tenderness found. Olecranon process tenderness noted.  Neurological: He is alert. He displays tremor.  Skin: Skin is  warm and dry.    Results for orders placed or performed during the hospital encounter of 08/21/14  CBC with Differential  Result Value Ref Range   WBC 6.4 4.0 - 10.5 K/uL   RBC 4.53 4.22 - 5.81 MIL/uL   Hemoglobin 13.9 13.0 - 17.0 g/dL   HCT 41.1 39.0 - 52.0 %   MCV 90.7 78.0 - 100.0 fL   MCH 30.7 26.0 - 34.0 pg   MCHC 33.8 30.0 - 36.0 g/dL   RDW 12.5 11.5 - 15.5 %   Platelets 144 (L) 150 - 400 K/uL   Neutrophils Relative % 60 43 - 77 %   Neutro Abs 3.9 1.7 - 7.7 K/uL   Lymphocytes Relative 25 12 - 46 %   Lymphs Abs 1.6 0.7 - 4.0 K/uL   Monocytes Relative 10 3 - 12 %   Monocytes Absolute 0.6 0.1 - 1.0 K/uL   Eosinophils Relative 4 0 - 5 %   Eosinophils Absolute 0.3 0.0 - 0.7 K/uL   Basophils Relative 1 0 - 1 %   Basophils Absolute 0.0 0.0 - 0.1 K/uL  Comprehensive metabolic panel  Result Value Ref Range   Sodium 141 135 - 145 mmol/L   Potassium 4.1 3.5 - 5.1 mmol/L   Chloride 106 101 - 111 mmol/L   CO2 28 22 - 32 mmol/L   Glucose, Bld 105 (H) 65 - 99 mg/dL   BUN 16 6 - 20 mg/dL   Creatinine, Ser 0.79 0.61 - 1.24 mg/dL   Calcium 8.8 (L) 8.9 - 10.3 mg/dL   Total Protein 6.6 6.5 - 8.1 g/dL   Albumin 3.7 3.5 - 5.0 g/dL   AST 23 15 - 41 U/L   ALT 28 17 - 63 U/L   Alkaline Phosphatase 67 38 - 126 U/L   Total Bilirubin 1.1 0.3 - 1.2 mg/dL   GFR calc non Af Amer >60 >60 mL/min   GFR calc Af Amer >60 >60 mL/min   Anion gap 7 5 - 15  POC occult blood, ED  Result Value Ref Range   Fecal Occult Bld NEGATIVE NEGATIVE      Assessment & Plan:   1. Olecranon bursa abscess, right Rest, compression, ice - sulfamethoxazole-trimethoprim (BACTRIM DS) 800-160 MG tablet; Take 1 tablet by mouth 2 (two) times daily.  Dispense: 20 tablet; Refill: 0 Specific information from [place] given to patient.   2. Olecranon bursitis of right elbow - naproxen sodium (ALEVE) 220 MG tablet; Take 2 tablets (440 mg total) by mouth daily. TAKE 2 tab BID for the flare up of the  bursitis  3. Tremor, essential - propranolol ER (INDERAL LA) 120 MG 24 hr capsule; Take 1 capsule (120 mg total) by mouth daily.  Dispense: 30 capsule; Refill: 12   Continue all other maintenance medications  as listed above.  Follow up plan: Return in about 10 days (around 10/28/2015).  Counseling provided for all of the age appropriate vaccines. No orders of the defined types were placed in this encounter.   Terald Sleeper PA-C [facility] [address]  [place], [place] [address] [phone]   10/18/2015, 9:50 AM

## 2015-10-21 ENCOUNTER — Telehealth: Payer: Self-pay

## 2015-10-21 NOTE — Telephone Encounter (Signed)
Spoke with patient and in

## 2015-10-21 NOTE — Telephone Encounter (Signed)
I will check with the pharmacy to see what the cash price is.

## 2015-10-22 NOTE — Telephone Encounter (Signed)
Spoke with Robert Faulkner at [facility], prescription for Propranolol ER has been changed to 40 mg. Three times a day.  Contacted patient and informed them also.

## 2015-10-29 ENCOUNTER — Ambulatory Visit (INDEPENDENT_AMBULATORY_CARE_PROVIDER_SITE_OTHER): Payer: Medicare Other | Admitting: Physician Assistant

## 2015-10-29 ENCOUNTER — Encounter: Payer: Self-pay | Admitting: Physician Assistant

## 2015-10-29 VITALS — BP 95/52 | HR 57 | Temp 97.4°F | Ht 71.0 in | Wt 220.0 lb

## 2015-10-29 DIAGNOSIS — M71021 Abscess of bursa, right elbow: Secondary | ICD-10-CM | POA: Diagnosis not present

## 2015-10-29 DIAGNOSIS — M7021 Olecranon bursitis, right elbow: Secondary | ICD-10-CM | POA: Diagnosis not present

## 2015-10-29 NOTE — Patient Instructions (Signed)
Olecranon Bursitis With Rehab A bursa is a fluid-filled sac that is located between soft tissues (ligaments, tendons, skin) and bones. The purpose of a bursa is to allow the soft tissue to function smoothly, without friction. The olecranon bursa is located between the back of the elbow (olecranon) and the skin. Olecranon bursitis involves inflammation of this bursa, resulting in pain. SYMPTOMS   Pain, tenderness, swelling, warmth, or redness over the back of the elbow.  Reduced range of motion of the affected elbow.  Sometimes, severe pain with movement of the affected elbow.  Crackling sound (crepitation) when the bursa is moved or touched.  Often, painless swelling of the bursa.  Fever (when infected). CAUSES  Olecranon bursitis is often caused by direct hit (trauma) to the elbow. Less commonly, it is due to overuse and/or strenuous exercise that the elbow is not used to. RISK INCREASES WITH:  Sports that require bending or landing on the elbow (football, volleyball).  Vigorous or repetitive athletic training, or sudden increase or change in activity level (weekend warriors).  Failure to warm up properly before activity.  Poor exercise technique.  Playing on artificial turf. PREVENTION  Avoid injuries and the overuse of muscles whenever possible.  Warm up and stretch properly before activity.  Allow for adequate recovery between workouts.  Maintain physical fitness:  Strength, flexibility, and endurance.  Cardiovascular fitness.  Learn and use proper technique.  Wear properly fitted and padded protective equipment. PROGNOSIS  If treated properly, olecranon bursitis is usually curable within 2 weeks.  RELATED COMPLICATIONS   Longer healing time, if not properly treated or if not given enough time to heal.  Recurring symptoms that result in a chronic problem.  Joint stiffness with permanent limitation of the affected joint's movement.  Infection of the  bursa.  Chronic inflammation or scarring of the bursa. TREATMENT Treatment first involves the use of ice and medicine to reduce pain and inflammation. The use of strengthening and stretching exercises may help reduce pain with activity. These exercises may be performed at home or with a therapist. Elbow pads may be advised to protect the bursa. If symptoms persist despite nonsurgical treatment, a procedure to withdraw fluid from the bursa may be advised. This procedure may be accompanied with an injection of corticosteroids to reduce inflammation. Sometimes, surgery is needed to remove the bursa. MEDICATION  If pain medicine is needed, nonsteroidal anti-inflammatory medicines (aspirin and ibuprofen), or other minor pain relievers (acetaminophen), are often advised.  Do not take pain medicine for 7 days before surgery.  Prescription pain relievers may be given, if your caregiver thinks they are needed. Use only as directed and only as much as you need.  Corticosteroid injections may be given by your caregiver. These injections should be reserved for the most serious cases, because they may only be given a certain number of times. HEAT AND COLD  Cold treatment (icing) should be applied for 10 to 15 minutes every 2 to 3 hours for inflammation and pain, and immediately after activity that aggravates your symptoms. Use ice packs or an ice massage.  Heat treatment may be used before performing stretching and strengthening activities prescribed by your caregiver, physical therapist, or athletic trainer. Use a heat pack or a warm water soak. SEEK IMMEDIATE MEDICAL CARE IF:   Symptoms get worse or do not improve in 2 weeks, despite treatment.  Signs of infection develop, including fever of 102 F (38.9 C), increased pain, redness, warmth, or pus draining from the bursa.    New, unexplained symptoms develop. (Drugs used in treatment may produce side effects.) EXERCISES  RANGE OF MOTION (ROM) AND  STRETCHING EXERCISES - Olecranon Bursitis These exercises may help you when beginning to rehabilitate your injury. Your symptoms may resolve with or without further involvement from your physician, physical therapist or athletic trainer. While completing these exercises, remember:   Restoring tissue flexibility helps normal motion to return to the joints. This allows healthier, less painful movement and activity.  An effective stretch should be held for at least 30 seconds.  A stretch should never be painful. You should only feel a gentle lengthening or release in the stretched tissue. RANGE OF MOTION - Elbow Flexion, Supine  Lie on your back. Extend your right / left arm into the air, bracing it with your opposite hand. Allow your right / left arm to relax.  Let your elbow bend, allowing your hand to fall slowly toward your chest.  You should feel a gentle stretch along the back of your upper arm and elbow. Your physician, physical therapist or athletic trainer may ask you to hold a __________ hand weight to increase the intensity of this stretch.  Hold for __________ seconds. Slowly return your right / left arm to the upright position. Repeat __________ times. Complete this exercise __________ times per day. STRETCH - Elbow Flexors  Lie on a firm bed or countertop on your back. Be sure that you are in a comfortable position which will allow you to relax your arm muscles.  Place a folded towel under your right / left upper arm, so that your elbow and shoulder are at the same height. Extend your arm; your elbow should not rest on the bed or towel  Allow the weight of your hand to straighten your elbow. Keep your arm and chest muscles relaxed. Your caregiver may ask you to increase the intensity of your stretch by adding a small wrist or hand weight.  Hold for __________ seconds. You should feel a stretch on the inside of your elbow. Slowly return to the starting position. Repeat __________  times. Complete this exercise __________ times per day. STRENGTHENING EXERCISES - Olecranon Bursitis These exercises will help you regain your strength. They may resolve your symptoms with or without further involvement from your physician, physical therapist or athletic trainer. While completing these exercises, remember:   Muscles can gain both the endurance and the strength needed for everyday activities through controlled exercises.  Complete these exercises as instructed by your physician, physical therapist or athletic trainer. Increase the resistance and repetitions only as guided by your caregiver.  You may experience muscle soreness or fatigue, but the pain or discomfort you are trying to eliminate should never worsen during these exercises. If this pain does worsen, stop and make certain you are following the directions exactly. If the pain is still present after adjustments, discontinue the exercise until you can discuss the trouble with your caregiver. STRENGTH - Elbow Extensors, Isometric  Stand or sit upright on a firm surface. Place your right / left arm so that your palm faces your stomach, and it is at the height of your waist.  Place your opposite hand on the underside of your forearm. Gently push up as your right / left arm resists. Push as hard as you can with both arms without causing any pain or movement at your right / left elbow. Hold this stationary position for __________ seconds.  Gradually release the tension in both arms. Allow your muscles to   relax completely before repeating. Repeat __________ times. Complete this exercise __________ times per day. STRENGTH - Elbow Flexors, Isometric  Stand or sit upright on a firm surface. Place your right / left arm so that your hand is palm-up and at the height of your waist.  Place your opposite hand on top of your forearm. Gently push down as your right / left arm resists. Push as hard as you can with both arms without causing  any pain or movement at your right / left elbow. Hold this stationary position for __________ seconds.  Gradually release the tension in both arms. Allow your muscles to relax completely before repeating. Repeat __________ times. Complete this exercise __________ times per day. STRENGTH - Elbow Flexors, Supinated  With good posture, stand or sit on a firm chair without armrests. Allow your right / left arm to rest at your side with your palm facing forward.  Holding a __________ weight, or gripping a rubber exercise band or tubing,  bring your hand toward your shoulder.  Allow your muscles to control the resistance as your hand returns to your side. Repeat __________ times. Complete this exercise __________ times per day.  STRENGTH - Elbow Flexors, Neutral  With good posture, stand or sit on a firm chair without armrests. Allow your right / left arm to rest at your side with your thumb facing forward.  Holding a __________weight, or gripping a rubber exercise band or tubing,  bring your hand toward your shoulder.  Allow your muscles to control the resistance as your hand returns to your side. Repeat __________ times. Complete this exercise __________ times per day.  STRENGTH - Elbow Extensors  Lie on your back. Extend your right / left elbow into the air, pointing it toward the ceiling. Brace your arm with your opposite hand.*  Holding a __________ weight in your hand, slowly straighten your right / left elbow.  Allow your muscles to control the weight as your hand returns to its starting position. Repeat __________ times. Complete this exercise __________ times per day. *You may also stand with your elbow overhead and pointed toward the ceiling, supported by your opposite hand. STRENGTH - Elbow Extensors, Dynamic  With good posture, stand, or sit on a firm chair without armrests. Keeping your upper arms at your side, bring both hands up to your right / left shoulder while gripping  a rubber exercise band or tubing. Your right / left hand should be just below the other hand.  Straighten your right / left elbow. Hold for __________ seconds.  Allow your muscles to control the rubber exercise band, as your hand returns to your shoulder. Repeat __________ times. Complete this exercise __________ times per day.   This information is not intended to replace advice given to you by your health care provider. Make sure you discuss any questions you have with your health care provider.   Document Released: 02/09/2005 Document Revised: 06/26/2014 Document Reviewed: 05/24/2008 Elsevier Interactive Patient Education 2016 Else[employer] Inc.  

## 2015-10-29 NOTE — Progress Notes (Signed)
BP (!) 95/52   Pulse (!) 57   Temp 97.4 F (36.3 C) (Oral)   Ht 5\' 11"  (1.803 m)   Wt 220 lb (99.8 kg)   BMI 30.68 kg/m    Subjective:    Patient ID: Robert Faulkner, male    DOB: 11-27-1945, 70 y.o.   MRN: [id number]  HPI: Robert Faulkner is a 70 y.o. male presenting on 10/29/2015 for recheck elbow His right olecranon bursitis is feeling tremendously better. The swelling has gone down. He did have a slight bit of drainage. He has completed his medication. He is able to work without any pain.  Relevant past medical, surgical, family and social history reviewed and updated as indicated. Interim medical history since our last visit reviewed. Allergies and medications reviewed and updated. DATA REVIEWED: CHART IN EPIC  Review of Systems  Constitutional: Negative.  Negative for appetite change and fatigue.  HENT: Negative.   Eyes: Negative.  Negative for pain and visual disturbance.  Respiratory: Negative.  Negative for cough, chest tightness, shortness of breath and wheezing.   Cardiovascular: Negative.  Negative for chest pain, palpitations and leg swelling.  Gastrointestinal: Negative.  Negative for abdominal pain, diarrhea, nausea and vomiting.  Endocrine: Negative.   Genitourinary: Negative.   Musculoskeletal: Negative.   Skin: Positive for wound. Negative for color change and rash.  Neurological: Negative.  Negative for weakness, numbness and headaches.  Psychiatric/Behavioral: Negative.     Per HPI unless specifically indicated above     Medication List       Accurate as of 10/29/15  8:23 AM. Always use your most recent med list.          aspirin EC 81 MG tablet Take 81 mg by mouth daily.   multivitamin with minerals Tabs tablet Take 1 tablet by mouth daily.   naproxen sodium 220 MG tablet Commonly known as:  ALEVE Take 2 tablets (440 mg total) by mouth daily. TAKE 2 tab BID for the flare up of the bursitis   propranolol 40 MG tablet Commonly known as:   INDERAL Take 40 mg by mouth 3 (three) times daily.          Objective:    BP (!) 95/52   Pulse (!) 57   Temp 97.4 F (36.3 C) (Oral)   Ht 5\' 11"  (1.803 m)   Wt 220 lb (99.8 kg)   BMI 30.68 kg/m   No Known Allergies  Wt Readings from Last 3 Encounters:  10/29/15 220 lb (99.8 kg)  10/18/15 223 lb 12.8 oz (101.5 kg)  09/11/14 225 lb 3.2 oz (102.2 kg)    Physical Exam  Constitutional: He appears well-developed and well-nourished. No distress.  HENT:  Head: Normocephalic and atraumatic.  Eyes: Conjunctivae and EOM are normal. Pupils are equal, round, and reactive to light.  Cardiovascular: Normal rate, regular rhythm and normal heart sounds.   Pulmonary/Chest: Effort normal and breath sounds normal. No respiratory distress.  Musculoskeletal: He exhibits edema, tenderness and deformity.  Right olecranon bursa is decreased and drained  Skin: Skin is warm and dry.  Psychiatric: He has a normal mood and affect. His behavior is normal.  Nursing note and vitals reviewed.       Assessment & Plan:   1. Olecranon bursitis of right elbow Continue covered area and keep clean and dry, naproxen as needed  2. Olecranon bursa abscess, right See above   Continue all other maintenance medications as listed above.  Follow up plan: Return in  about 3 months (around 01/28/2016) for yearly check and labs.  Counseling provided for all of the age appropriate vaccines. No orders of the defined types were placed in this encounter.   Terald Sleeper PA-C [facility] [address]  [place], [place] [address] [phone]   10/29/2015, 8:23 AM

## 2015-11-25 ENCOUNTER — Ambulatory Visit (INDEPENDENT_AMBULATORY_CARE_PROVIDER_SITE_OTHER): Payer: Medicare Other

## 2015-11-25 DIAGNOSIS — Z23 Encounter for immunization: Secondary | ICD-10-CM

## 2016-04-15 DIAGNOSIS — H524 Presbyopia: Secondary | ICD-10-CM | POA: Diagnosis not present

## 2016-07-20 IMAGING — CT CT ABD-PELV W/ CM
2 of 5 series · 17 of 46 positions shown, 19 images · IV contrast (Omnipaque 300)
Comparison: None.

CLINICAL DATA: Rectal bleeding

EXAM:
CT ABDOMEN AND PELVIS WITH CONTRAST
TECHNIQUE: Multidetector CT imaging of the abdomen and pelvis was performed
using the standard protocol following bolus administration of
intravenous contrast.
CONTRAST:  50mL OMNIPAQUE IOHEXOL 300 MG/ML SOLN, 100mL OMNIPAQUE
IOHEXOL 300 MG/ML SOLN

[Series 2: abd_pel_with 5.0 b40f · axial · 0.85mm/px · z∈[-458,-28]mm · 14 of 96 slices shown, 16 images]
[im 5/96  soft-tissue]
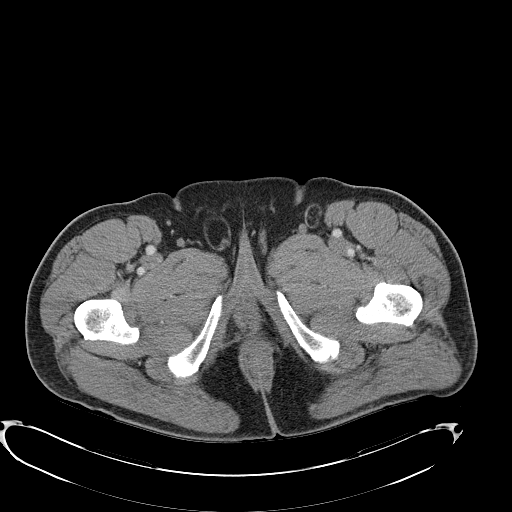
[im 5/96  bone]
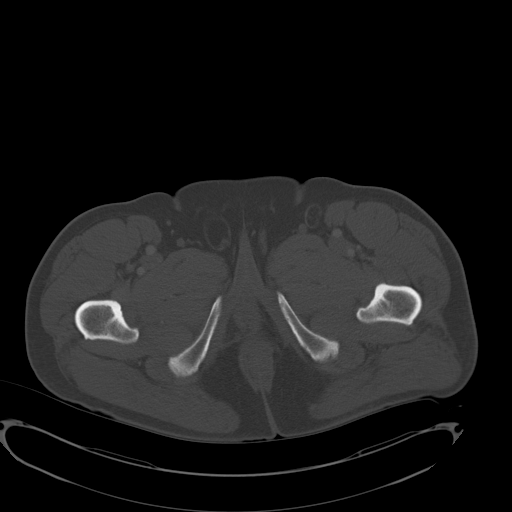
[im 15/96  soft-tissue]
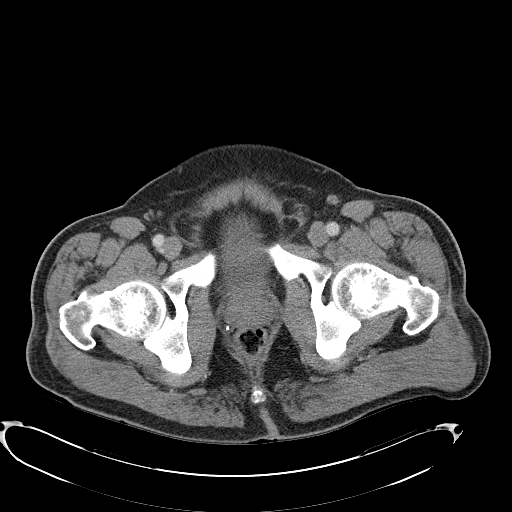
[im 20/96  soft-tissue]
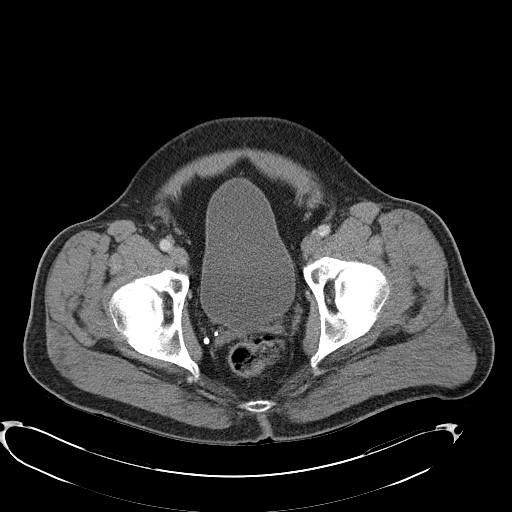
[im 24/96  soft-tissue]
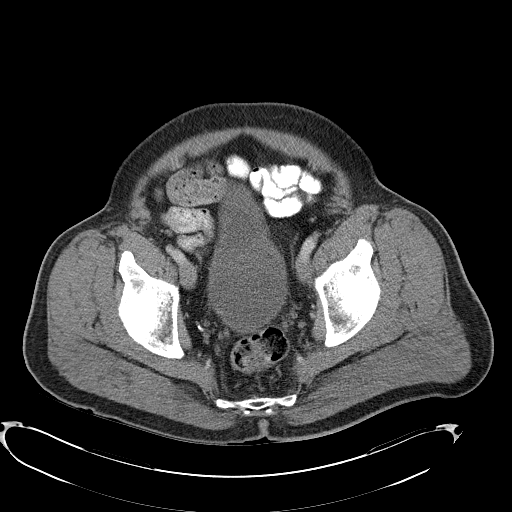
[im 34/96  soft-tissue]
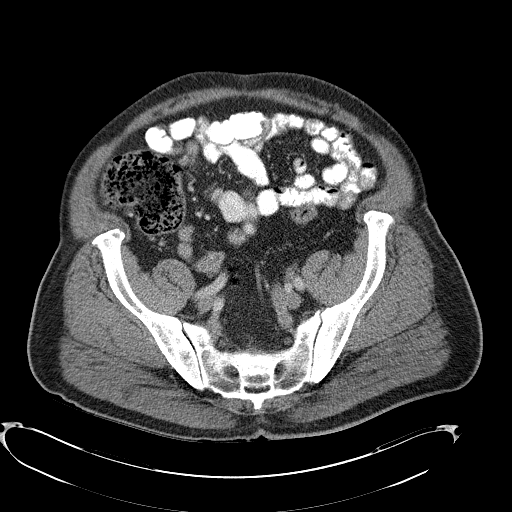
[im 39/96  soft-tissue]
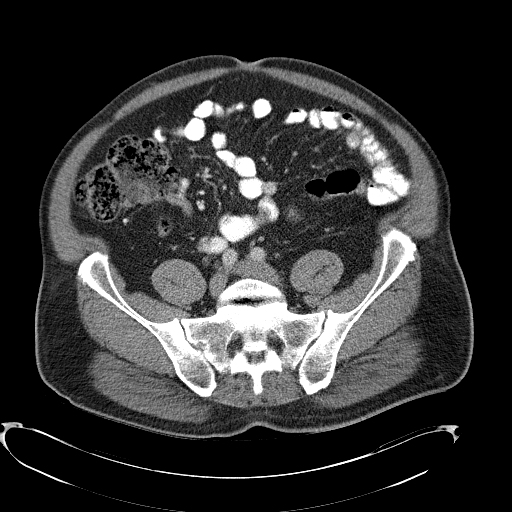
[im 43/96  soft-tissue]
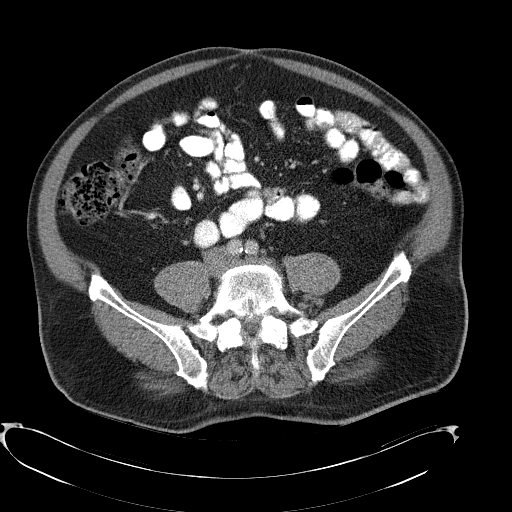
[im 53/96  soft-tissue]
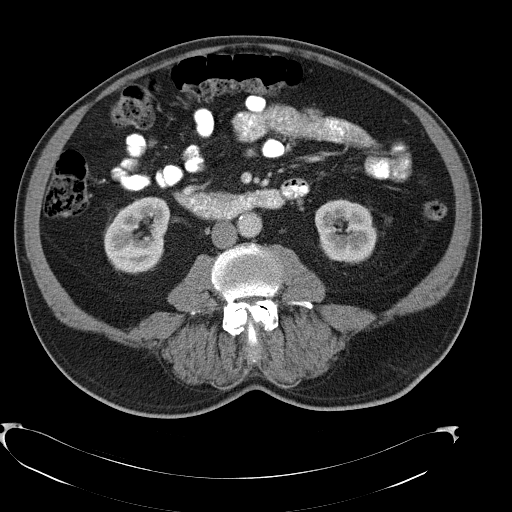
[im 58/96  soft-tissue]
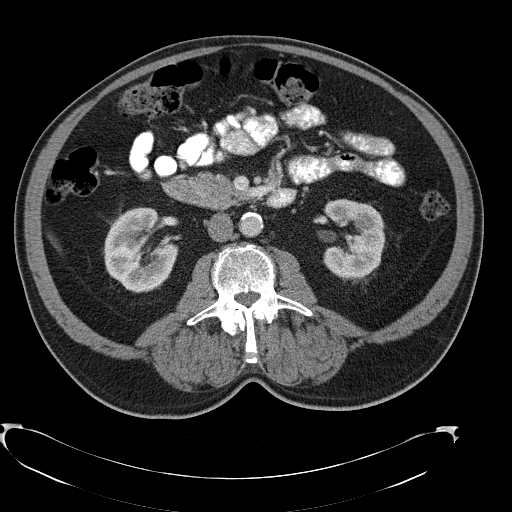
[im 58/96  bone]
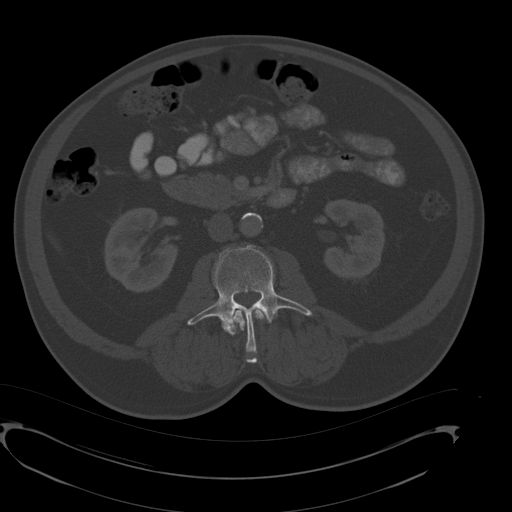
[im 62/96  soft-tissue]
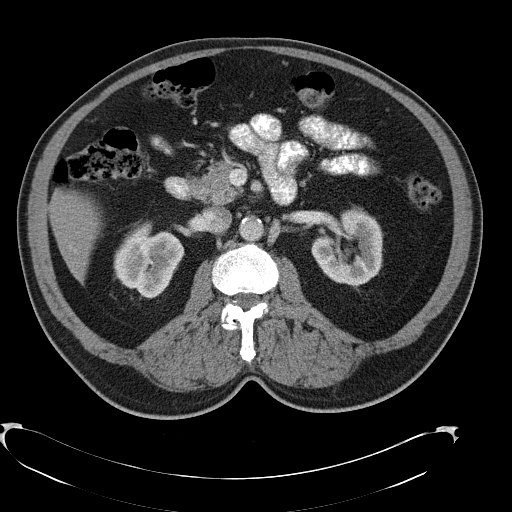
[im 72/96  soft-tissue]
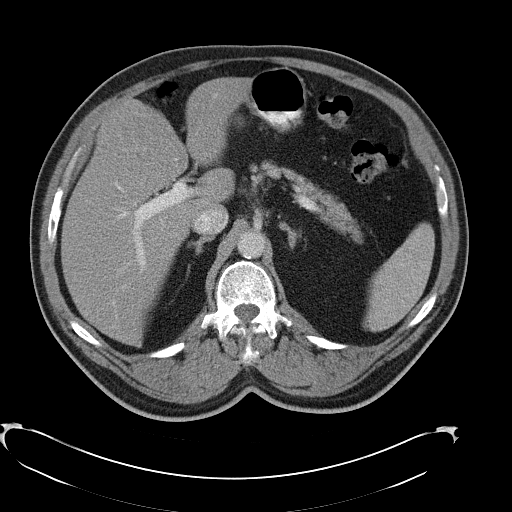
[im 77/96  soft-tissue]
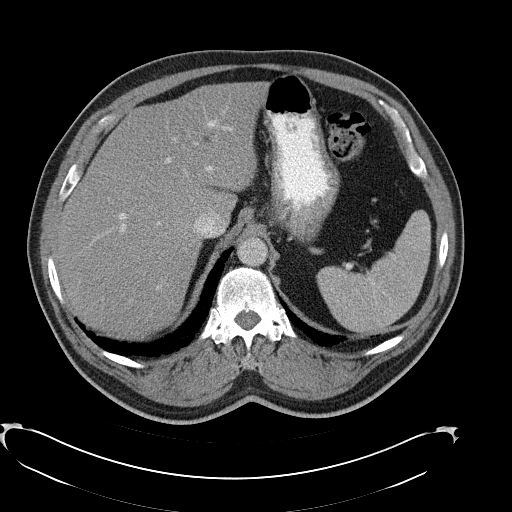
[im 81/96  soft-tissue]
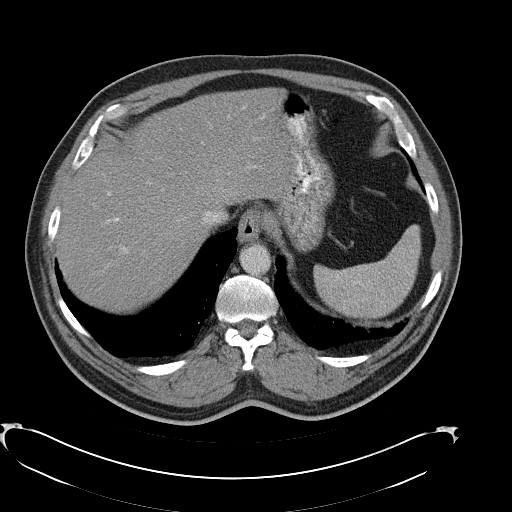
[im 91/96  soft-tissue]
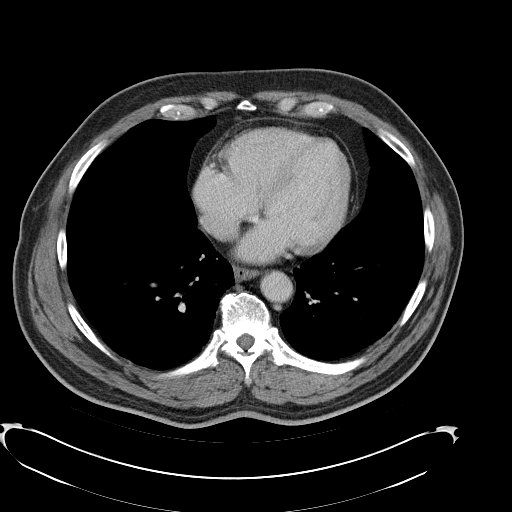

[Series 4: abd_pel_with 3.0 spo cor · coronal · 0.86mm/px · 3 of 100 slices shown]
[im 34/100  soft-tissue]
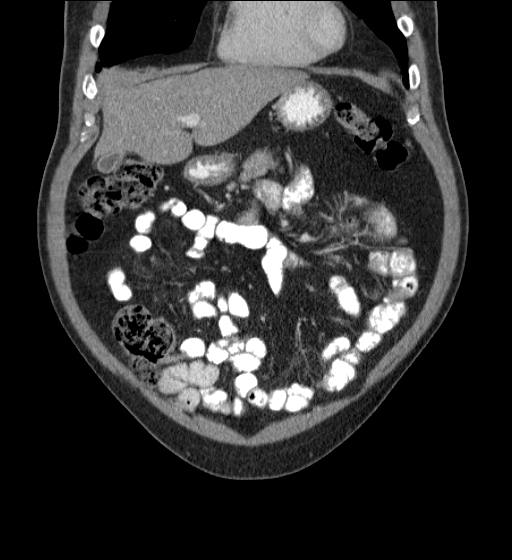
[im 45/100  soft-tissue]
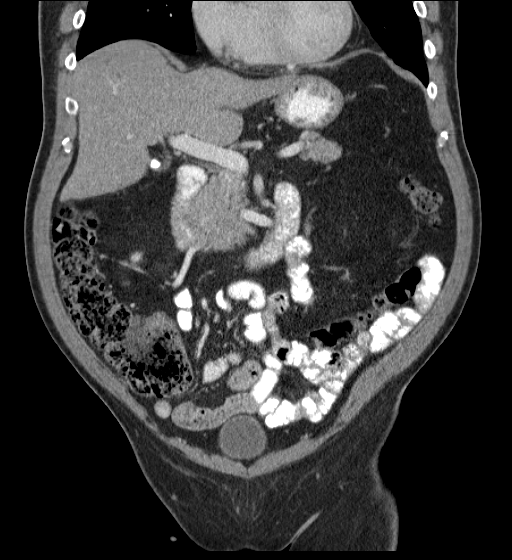
[im 56/100  soft-tissue]
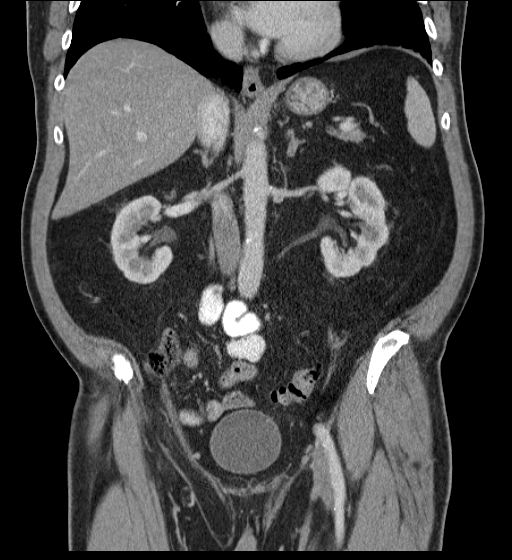

[17 of 46 positions shown; findings below may reference images not displayed]

FINDINGS: Lung bases are free of acute infiltrate or sizable effusion.

The liver is diffusely fatty infiltrated. The gallbladder is
decompressed and there is a single lamellated gallstone in the
inferior aspect of the gallbladder near the neck although not
impacted. The spleen, adrenal glands and pancreas are within normal
limits. The kidneys demonstrate a normal enhancement pattern. No
renal calculi or obstructive changes are noted. The ureters are
within normal limits bilaterally.

Diffuse aortoiliac calcifications are noted without aneurysmal
dilatation. The appendix is retrocecal in location. No inflammatory
changes are identified to suggest appendicitis. Mild diverticular
change is noted without evidence of diverticulitis. The bladder is
well distended. Degenerative changes of lumbar spine are noted.
IMPRESSION: Fatty liver.

Cholelithiasis without complicating factors.

Aortic atherosclerotic change.

No acute abnormality is noted to correspond with the patient's given
clinical history.

## 2016-09-09 ENCOUNTER — Ambulatory Visit (INDEPENDENT_AMBULATORY_CARE_PROVIDER_SITE_OTHER): Payer: Medicare Other | Admitting: *Deleted

## 2016-09-09 VITALS — BP 116/66 | HR 60 | Temp 97.3°F | Ht 71.0 in | Wt 221.0 lb

## 2016-09-09 DIAGNOSIS — Z23 Encounter for immunization: Secondary | ICD-10-CM | POA: Diagnosis not present

## 2016-09-09 DIAGNOSIS — Z0000 Encounter for general adult medical examination without abnormal findings: Secondary | ICD-10-CM

## 2016-09-09 NOTE — Progress Notes (Signed)
The provider is generic and has to be changed in order for me to sign the note. Do you know how to do that?

## 2016-09-09 NOTE — Patient Instructions (Addendum)
Keep Follow up Appointment with Robert Nearing, PA  Look over Advance Directive Packet and bring in copy of Power of Attorney to be scanned into your chart.  Talk with the [place] about hearing Check on pricing of Tetanus vaccine at next visit with Robert Nearing, PA Pneumonia Vaccine done Today    Robert Faulkner , Thank you for taking time to come for your Medicare Wellness Visit. I appreciate your ongoing commitment to your health goals. Please review the following plan we discussed and let me know if I can assist you in the future.   These are the goals we discussed: Goals    . Exercise 3x per week (30 min per time)          Continue to exercise Daily     . Have 3 meals a day          Try lean grilled or baked meats.        This is a list of the screening recommended for you and due dates:  Health Maintenance  Topic Date Due  . Tetanus Vaccine  03/10/1962  . Pneumonia vaccines (1 of 2 - PCV13) 03/10/2008  .  Hepatitis C: One time screening is recommended by Center for Disease Control  (CDC) for  adults born from [birth year] through 1967.   10/28/2016*  . Flu Shot  09/23/2016  . Colon Cancer Screening  09/20/2024  *Topic was postponed. The date shown is not the original due date.    Pneumococcal Conjugate Vaccine suspension for injection What is this medicine? PNEUMOCOCCAL VACCINE (NEU mo KOK al vak SEEN) is a vaccine used to prevent pneumococcus bacterial infections. These bacteria can cause serious infections like pneumonia, meningitis, and blood infections. This vaccine will lower your chance of getting pneumonia. If you do get pneumonia, it can make your symptoms milder and your illness shorter. This vaccine will not treat an infection and will not cause infection. This vaccine is recommended for infants and young children, adults with certain medical conditions, and adults 4 years or older. This medicine may be used for other purposes; ask your health care provider or pharmacist if you  have questions. COMMON BRAND NAME(S): Prevnar, Prevnar 13 What should I tell my health care provider before I take this medicine? They need to know if you have any of these conditions: -bleeding problems -fever -immune system problems -an unusual or allergic reaction to pneumococcal vaccine, diphtheria toxoid, other vaccines, latex, other medicines, foods, dyes, or preservatives -pregnant or trying to get pregnant -breast-feeding How should I use this medicine? This vaccine is for injection into a muscle. It is given by a health care professional. A copy of Vaccine Information Statements will be given before each vaccination. Read this sheet carefully each time. The sheet may change frequently. Talk to your pediatrician regarding the use of this medicine in children. While this drug may be prescribed for children as young as 52 weeks old for selected conditions, precautions do apply. Overdosage: If you think you have taken too much of this medicine contact a poison control center or emergency room at once. NOTE: This medicine is only for you. Do not share this medicine with others. What if I miss a dose? It is important not to miss your dose. Call your doctor or health care professional if you are unable to keep an appointment. What may interact with this medicine? -medicines for cancer chemotherapy -medicines that suppress your immune function -steroid medicines like prednisone or cortisone This list  may not describe all possible interactions. Give your health care provider a list of all the medicines, herbs, non-prescription drugs, or dietary supplements you use. Also tell them if you smoke, drink alcohol, or use illegal drugs. Some items may interact with your medicine. What should I watch for while using this medicine? Mild fever and pain should go away in 3 days or less. Report any unusual symptoms to your doctor or health care professional. What side effects may I notice from receiving  this medicine? Side effects that you should report to your doctor or health care professional as soon as possible: -allergic reactions like skin rash, itching or hives, swelling of the face, lips, or tongue -breathing problems -confused -fast or irregular heartbeat -fever over 102 degrees F -seizures -unusual bleeding or bruising -unusual muscle weakness Side effects that usually do not require medical attention (report to your doctor or health care professional if they continue or are bothersome): -aches and pains -diarrhea -fever of 102 degrees F or less -headache -irritable -loss of appetite -pain, tender at site where injected -trouble sleeping This list may not describe all possible side effects. Call your doctor for medical advice about side effects. You may report side effects to FDA at 1-800-FDA-1088. Where should I keep my medicine? This does not apply. This vaccine is given in a clinic, pharmacy, doctor's office, or other health care setting and will not be stored at home. NOTE: This sheet is a summary. It may not cover all possible information. If you have questions about this medicine, talk to your doctor, pharmacist, or health care provider.  2018 Elsevier/Gold Standard (2013-11-16 10:27:27)

## 2016-09-09 NOTE — Progress Notes (Signed)
Subjective:   Robert Faulkner is a 71 y.o. male who presents for an Initial Medicare Annual Wellness Visit.  Robert Faulkner is retired from [employer] of 45 years.  He currently live at home with his wife and takes care of daily.  He has two sons that lives close by that comes to help take care of his wife.  Robert Faulkner enjoys mowing yard, working on [employer] and doing house work.  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 Robert Faulkner walks one mile daily and eats healthy meals that consist of fruits, vegetables, and protein.  Overall Robert Faulkner feels that his health is the same as it was a year ago.  No Hospitalizations or surgeries in the past year.     Objective:    Today's Vitals   09/09/16 0838  BP: 116/66  Pulse: 60  Temp: (!) 97.3 F (36.3 C)  TempSrc: Oral  Weight: 221 lb (100.2 kg)  Height: 5\' 11"  (1.803 m)   Body mass index is 30.82 kg/m.  Current Medications (verified) Outpatient Encounter Prescriptions as of 09/09/2016  Medication Sig  . aspirin EC 81 MG tablet Take 81 mg by mouth daily.  . Multiple Vitamin (MULTIVITAMIN WITH MINERALS) TABS tablet Take 1 tablet by mouth daily.  . naproxen sodium (ALEVE) 220 MG tablet Take 2 tablets (440 mg total) by mouth daily. TAKE 2 tab BID for the flare up of the bursitis  . propranolol (INDERAL) 40 MG tablet Take 40 mg by mouth 3 (three) times daily.   No facility-administered encounter medications on file as of 09/09/2016.     Allergies (verified) Patient has no known allergies.   History: Past Medical History:  Diagnosis Date  . Essential tremor   . Tremors of nervous system    Past Surgical History:  Procedure Laterality Date  . COLONOSCOPY N/A 09/21/2014   Procedure: COLONOSCOPY;  Surgeon: Danie Binder, MD;  [facility];  Service: Endoscopy;  Laterality: N/A;  1200 - moved to 10:30 - office to notify   Family History  Problem Relation Age of  Onset  . Arthritis Son   . Hyperlipidemia Son   . Hypertension Son   . Colon cancer Neg Hx    Social History   Occupational History  . Not on file.   Social History Main Topics  . Smoking status: Former Smoker    Packs/day: 2.00    Years: 11.00    Quit date: 09/20/1977  . Smokeless tobacco: Never Used  . Alcohol use No  . Drug use: No  . Sexual activity: Not on file   Tobacco Counseling Former Smoker.    Activities of Daily Living In your present state of health, do you have any difficulty performing the following activities: 09/09/2016  Hearing? Y  Vision? N  Difficulty concentrating or making decisions? N  Walking or climbing stairs? N  Dressing or bathing? N  Doing errands, shopping? N  Some recent data might be hidden  Trouble hearing in the left ear.  Will discuss with the [facility].  Immunizations and Health Maintenance Immunization History  Administered Date(s) Administered  . Influenza, High Dose Seasonal PF 11/25/2015  . Pneumococcal Conjugate-13 09/09/2016  Prevnar Given today Health Maintenance Due  Topic Date Due  . TETANUS/TDAP  03/10/1962  . PNA vac Low Risk Adult (1 of 2 - PCV13) 03/10/2008  Prevnar Given today Declines Shingrix Declines Tetanus-due to price. Will check on pricing at next  visit   Patient Care Team: Theodoro Clock as PCP - General (Physician Assistant) Danie Binder, MD as Consulting Physician (Gastroenterology)  Indicate any recent Medical Services you may have received from other than [facility] providers in the past year (date may be approximate).    Assessment:   This is a routine wellness examination for [name].   Hearing/Vision screen Eye Exam Done 03/2016 at [facility] in [place], [place].  Will Call to request copy   Dietary issues and exercise activities discussed: Current Exercise Habits: Home exercise routine, Type of exercise: walking, Time (Minutes): 30, Frequency (Times/Week): 7, Weekly Exercise (Minutes/Week): 210,  Intensity: Mild, Exercise limited by: None identified  Goals    . Exercise 3x per week (30 min per time)          Continue to exercise Daily     . Have 3 meals a day          Try lean grilled or baked meats.       Depression Screen PHQ 2/9 Scores 09/09/2016 10/29/2015 10/18/2015  PHQ - 2 Score 0 0 0    Fall Risk Fall Risk  09/09/2016 10/29/2015 10/18/2015  Falls in the past year? No Yes Yes  Number falls in past yr: - 1 1  Injury with Fall? - Yes Yes  Risk Factor Category  - - High Fall Risk  Follow up - - Falls prevention discussed    Cognitive Function: MMSE - Mini Mental State Exam 09/09/2016  Orientation to time 5  Orientation to Place 5  Registration 3  Attention/ Calculation 4  Recall 2  Language- name 2 objects 2  Language- repeat 1  Language- follow 3 step command 3  Language- read & follow direction 1  Write a sentence 1  Copy design 1  Total score 28  Had Trouble spelling and recalling     Screening Tests Health Maintenance  Topic Date Due  . TETANUS/TDAP  03/10/1962  . PNA vac Low Risk Adult (1 of 2 - PCV13) 03/10/2008  . Hepatitis C Screening  10/28/2016 (Originally 1946/02/08)  . INFLUENZA VACCINE  09/23/2016  . COLONOSCOPY  09/20/2024  Prevnar done today Declines tetanus and shingrix at this time.  Will discuss at next office visit      Plan:  Encouraged Robert Faulkner to take care of himself as he is the caretaker of his wife.  Encouraged him to keep his follow up appointment with PCP.  Appointment made for CPE with Particia Nearing. Encouraged to continue to exercise (Walk, treadmill, strengthening exercises) at least 30 minutes 3 times weekly.  Encouraged to increase lean proteins to diet such as grilled or baked chicken. Prevnar 13 given today.  Declines shingrix and tetanus vaccine at this time.  Will check on pricing of tetanus at next office visit. Encourage to [facility] or Angle Jones, PA  I have personally reviewed and  noted the following in the patient's chart:   . Medical and social history . Use of alcohol, tobacco or illicit drugs  . Current medications and supplements . Functional ability and status . Nutritional status . Physical activity . Advanced directives . List of other physicians . Hospitalizations, surgeries, and ER visits in previous 12 months . Vitals . Screenings to include cognitive, depression, and falls . Referrals and appointments  In addition, I have reviewed and discussed with patient certain preventive protocols, quality metrics, and best practice recommendations. A written personalized care plan for preventive  services as well as general preventive health recommendations were provided to patient.     Wardell Heath, LPN   4/44/5848    .I have reviewed and agree with the above AWV documentation.   Terald Sleeper PA-C [facility] [address]  [place], [place] [address] [phone]

## 2016-09-16 ENCOUNTER — Encounter: Payer: Self-pay | Admitting: Physician Assistant

## 2016-09-16 ENCOUNTER — Encounter: Payer: Medicare Other | Admitting: Physician Assistant

## 2016-09-16 ENCOUNTER — Ambulatory Visit (INDEPENDENT_AMBULATORY_CARE_PROVIDER_SITE_OTHER): Payer: Medicare Other | Admitting: Physician Assistant

## 2016-09-16 VITALS — BP 104/55 | HR 51 | Temp 96.8°F | Ht 71.0 in | Wt 221.8 lb

## 2016-09-16 DIAGNOSIS — Z0000 Encounter for general adult medical examination without abnormal findings: Secondary | ICD-10-CM | POA: Diagnosis not present

## 2016-09-16 MED ORDER — PROPRANOLOL HCL 40 MG PO TABS: 40.0000 mg | ORAL_TABLET | Freq: Three times a day (TID) | ORAL | 3 refills | Status: DC

## 2016-09-16 NOTE — Patient Instructions (Signed)
In a few days you may receive a survey in the mail or online from [employer] regarding your visit with us today. Please take a moment to fill this out. Your feedback is very important to our whole office. It can help us better understand your needs as well as improve your experience and satisfaction. Thank you for taking your time to complete it. We care about you.  Tarina Volk, PA-C  

## 2016-09-16 NOTE — Progress Notes (Signed)
BP (!) 104/55   Pulse (!) 51   Temp (!) 96.8 F (36 C) (Oral)   Ht 5\' 11"  (1.803 m)   Wt 221 lb 12.8 oz (100.6 kg)   BMI 30.93 kg/m    Subjective:    Patient ID: Robert Faulkner, male    DOB: 21-Oct-1945, 71 y.o.   MRN: [id number]  HPI: Robert Faulkner is a 71 y.o. male presenting on 09/16/2016 for Annual Exam  This patient comes in for annual well physical examination. All medications are reviewed today. There are no reports of any problems with the medications. All of the medical conditions are reviewed and updated.  Lab work is reviewed and will be ordered as medically necessary. There are no new problems reported with today's visit.  Patient reports doing well overall.   Relevant past medical, surgical, family and social history reviewed and updated as indicated. Allergies and medications reviewed and updated.  Past Medical History:  Diagnosis Date  . Essential tremor   . Tremors of nervous system     Past Surgical History:  Procedure Laterality Date  . COLONOSCOPY N/A 09/21/2014   Procedure: COLONOSCOPY;  Surgeon: Danie Binder, MD;  [facility];  Service: Endoscopy;  Laterality: N/A;  1200 - moved to 10:30 - office to notify    Review of Systems  Constitutional: Negative.  Negative for appetite change and fatigue.  HENT: Positive for hearing loss.   Eyes: Negative.  Negative for pain and visual disturbance.  Respiratory: Negative.  Negative for cough, chest tightness, shortness of breath and wheezing.   Cardiovascular: Negative.  Negative for chest pain, palpitations and leg swelling.  Gastrointestinal: Negative.  Negative for abdominal pain, diarrhea, nausea and vomiting.  Endocrine: Negative.   Genitourinary: Negative.   Musculoskeletal: Positive for arthralgias.  Skin: Negative.  Negative for color change and rash.  Neurological: Negative.  Negative for weakness, numbness and headaches.  Psychiatric/Behavioral: Negative.     Allergies as of  09/16/2016   No Known Allergies     Medication List       Accurate as of 09/16/16  2:49 PM. Always use your most recent med list.          aspirin EC 81 MG tablet Take 81 mg by mouth daily.   multivitamin with minerals Tabs tablet Take 1 tablet by mouth daily.   naproxen sodium 220 MG tablet Commonly known as:  ALEVE Take 2 tablets (440 mg total) by mouth daily. TAKE 2 tab BID for the flare up of the bursitis   propranolol 40 MG tablet Commonly known as:  INDERAL Take 1 tablet (40 mg total) by mouth 3 (three) times daily.          Objective:    BP (!) 104/55   Pulse (!) 51   Temp (!) 96.8 F (36 C) (Oral)   Ht 5\' 11"  (1.803 m)   Wt 221 lb 12.8 oz (100.6 kg)   BMI 30.93 kg/m   No Known Allergies  Physical Exam  Constitutional: He appears well-developed and well-nourished.  HENT:  Head: Normocephalic and atraumatic.  Eyes: Pupils are equal, round, and reactive to light. Conjunctivae and EOM are normal.  Neck: Normal range of motion. Neck supple.  Cardiovascular: Normal rate, regular rhythm and normal heart sounds.   Pulmonary/Chest: Effort normal and breath sounds normal.  Abdominal: Soft. Bowel sounds are normal.  Musculoskeletal: Normal range of motion. He exhibits deformity.       Left knee: He  exhibits deformity.  Skin: Skin is warm and dry.  Nursing note and vitals reviewed.       Assessment & Plan:   1. Well adult exam Annual labs soon, order to be placed  Current Outpatient Prescriptions:  .  aspirin EC 81 MG tablet, Take 81 mg by mouth daily., Disp: , Rfl:  .  Multiple Vitamin (MULTIVITAMIN WITH MINERALS) TABS tablet, Take 1 tablet by mouth daily., Disp: , Rfl:  .  naproxen sodium (ALEVE) 220 MG tablet, Take 2 tablets (440 mg total) by mouth daily. TAKE 2 tab BID for the flare up of the bursitis, Disp: , Rfl:  .  propranolol (INDERAL) 40 MG tablet, Take 1 tablet (40 mg total) by mouth 3 (three) times daily., Disp: 270 tablet, Rfl: 3  Continue  all other maintenance medications as listed above.  Follow up plan: Return in about 1 year (around 09/16/2017) for well exam.  Educational handout given for [place] PA-C [facility] [address]  [place],  [address] [phone]   09/16/2016, 2:49 PM

## 2016-10-12 DIAGNOSIS — L820 Inflamed seborrheic keratosis: Secondary | ICD-10-CM | POA: Diagnosis not present

## 2016-12-21 ENCOUNTER — Ambulatory Visit (INDEPENDENT_AMBULATORY_CARE_PROVIDER_SITE_OTHER): Payer: Medicare Other

## 2016-12-21 DIAGNOSIS — Z23 Encounter for immunization: Secondary | ICD-10-CM | POA: Diagnosis not present

## 2017-03-15 ENCOUNTER — Ambulatory Visit: Payer: Medicare Other | Admitting: Nurse Practitioner

## 2017-03-15 ENCOUNTER — Encounter: Payer: Self-pay | Admitting: Nurse Practitioner

## 2017-03-15 VITALS — BP 145/74 | HR 49 | Temp 96.8°F | Ht <= 58 in | Wt 224.0 lb

## 2017-03-15 DIAGNOSIS — H8303 Labyrinthitis, bilateral: Secondary | ICD-10-CM | POA: Diagnosis not present

## 2017-03-15 MED ORDER — MECLIZINE HCL 25 MG PO TABS: 25.0000 mg | ORAL_TABLET | Freq: Three times a day (TID) | ORAL | 0 refills | Status: DC | PRN

## 2017-03-15 NOTE — Patient Instructions (Signed)
Labyrinthitis Labyrinthitis is an infection of the inner ear. Your inner ear is a system of tubes and canals (labyrinth). These are filled with fluid. Nerve cells in your inner ear send signals for hearing and balance to your brain. When tiny germs get inside the tubes and canals, they harm the cells that send messages to the brain. This can cause changes in hearing and balance. Follow these instructions at home:  Take medicines only as told by your doctor.  If you were prescribed an antibiotic medicine, finish all of it even if you start to feel better.  Rest as much as possible.  Avoid loud noises and bright lights.  Do not make sudden movements until any dizziness goes away.  Do not drive until your doctor says that you can.  Drink enough fluid to keep your pee (urine) clear or pale yellow.  Work with a physical therapist if you still feel dizzy after several weeks. A therapist can teach you exercises to help you deal with your dizziness.  Keep all follow-up visits as told by your doctor. This is important. Contact a doctor if:  Your symptoms do not get better with medicines.  You do not get better after two weeks.  You have a fever. Get help right away if:  You are very dizzy.  You keep throwing up (vomiting) or keep feeling sick to your stomach (nauseous).  Your hearing gets a lot worse very quickly. This information is not intended to replace advice given to you by your health care provider. Make sure you discuss any questions you have with your health care provider. Document Released: 02/09/2005 Document Revised: 07/18/2015 Document Reviewed: 11/21/2013 Elsevier Interactive Patient Education  2018 Elsevier Inc.  

## 2017-03-15 NOTE — Progress Notes (Signed)
   Subjective:    Patient ID: Robert Faulkner, male    DOB: 1945-06-18, 72 y.o.   MRN: [id number]  HPI  Patient in today accompanied by wife. Patient is c/o dizziness for 5 days. He washed his ears out real good last week and started after that. He has had this in the past but does not usually last this long. Has had some nausea.   Review of Systems  Constitutional: Negative.   HENT: Negative.   Respiratory: Negative.   Cardiovascular: Negative.   Gastrointestinal: Positive for nausea.  Genitourinary: Negative.   Skin: Negative.   Neurological: Positive for dizziness.  Psychiatric/Behavioral: Negative.   All other systems reviewed and are negative.      Objective:   Physical Exam  Constitutional: He is oriented to person, place, and time. He appears well-developed and well-nourished. No distress.  Cardiovascular: Normal rate and regular rhythm.  Pulmonary/Chest: Effort normal and breath sounds normal.  Neurological: He is alert and oriented to person, place, and time.  Skin: Skin is warm.  Psychiatric: He has a normal mood and affect. His behavior is normal. Judgment and thought content normal.   BP (!) 145/74   Pulse (!) 49   Temp (!) 96.8 F (36 C) (Oral)   Ht 1' (0.305 m)   Wt 224 lb (101.6 kg)   HC 71" (180.3 cm)   BMI 1093.68 kg/m         Assessment & Plan:   1. Labyrinthitis of both ears    Force fluids Rise slowly from sitting to standing Meds ordered this encounter  Medications  . meclizine (ANTIVERT) 25 MG tablet    Sig: Take 1 tablet (25 mg total) by mouth 3 (three) times daily as needed for dizziness.    Dispense:  30 tablet    Refill:  0    Order Specific Question:   Supervising Provider    Answer:   Eustaquio Maize [[id number]]   Mary-Margaret Hassell Done, FNP

## 2018-04-19 ENCOUNTER — Telehealth: Payer: Self-pay | Admitting: Physician Assistant

## 2018-04-19 NOTE — Telephone Encounter (Signed)
Pt is scheduled for 2/27

## 2018-04-21 ENCOUNTER — Ambulatory Visit (INDEPENDENT_AMBULATORY_CARE_PROVIDER_SITE_OTHER): Payer: Medicare Other | Admitting: Physician Assistant

## 2018-04-21 ENCOUNTER — Encounter (INDEPENDENT_AMBULATORY_CARE_PROVIDER_SITE_OTHER): Payer: Self-pay

## 2018-04-21 VITALS — BP 113/64 | HR 48 | Ht 71.0 in | Wt 225.0 lb

## 2018-04-21 DIAGNOSIS — Z0000 Encounter for general adult medical examination without abnormal findings: Secondary | ICD-10-CM | POA: Diagnosis not present

## 2018-04-21 DIAGNOSIS — Z23 Encounter for immunization: Secondary | ICD-10-CM

## 2018-04-21 NOTE — Patient Instructions (Addendum)
Please work on your goal of continuing to exercise daily.  Walking is a great option.  At your convenience, please bring a copy of your Advance Directives (Healthcare Power of Attorney and Living Will) to our office to be filed in your medical record.  Please continue to move carefully to avoid falls, and use extra caution when using power tools.   [facility] (tetanus, diptheria, and pertussis) and Shingrix (Shingles) vaccines for no charge there.   Please follow up with Particia Nearing, PA as scheduled.   Thank you for coming in for your Annual Wellness Visit today!   Preventive Care 12 Years and Older, Male Preventive care refers to lifestyle choices and visits with your health care provider that can promote health and wellness. What does preventive care include?   A yearly physical exam. This is also called an annual well check.  Dental exams once or twice a year.  Routine eye exams. Ask your health care provider how often you should have your eyes checked.  Personal lifestyle choices, including: ? Daily care of your teeth and gums. ? Regular physical activity. ? Eating a healthy diet. ? Avoiding tobacco and drug use. ? Limiting alcohol use. ? Practicing safe sex. ? Taking low doses of aspirin every day. ? Taking vitamin and mineral supplements as recommended by your health care provider. What happens during an annual well check? The services and screenings done by your health care provider during your annual well check will depend on your age, overall health, lifestyle risk factors, and family history of disease. Counseling Your health care provider may ask you questions about your:  Alcohol use.  Tobacco use.  Drug use.  Emotional well-being.  Home and relationship well-being.  Sexual activity.  Eating habits.  History of falls.  Memory and ability to understand (cognition).  Work and work Statistician. Screening You may have the  following tests or measurements:  Height, weight, and BMI.  Blood pressure.  Lipid and cholesterol levels. These may be checked every 5 years, or more frequently if you are over 6 years old.  Skin check.  Lung cancer screening. You may have this screening every year starting at age 80 if you have a 30-pack-year history of smoking and currently smoke or have quit within the past 15 years.  Colorectal cancer screening. All adults should have this screening starting at age 64 and continuing until age 29. You will have tests every 1-10 years, depending on your results and the type of screening test. People at increased risk should start screening at an earlier age. Screening tests may include: ? Guaiac-based fecal occult blood testing. ? Fecal immunochemical test (FIT). ? Stool DNA test. ? Virtual colonoscopy. ? Sigmoidoscopy. During this test, a flexible tube with a tiny camera (sigmoidoscope) is used to examine your rectum and lower colon. The sigmoidoscope is inserted through your anus into your rectum and lower colon. ? Colonoscopy. During this test, a long, thin, flexible tube with a tiny camera (colonoscope) is used to examine your entire colon and rectum.  Prostate cancer screening. Recommendations will vary depending on your family history and other risks.  Hepatitis C blood test.  Hepatitis B blood test.  Sexually transmitted disease (STD) testing.  Diabetes screening. This is done by checking your blood sugar (glucose) after you have not eaten for a while (fasting). You may have this done every 1-3 years.  Abdominal aortic aneurysm (AAA) screening. You may need this if  you are a current or former smoker.  Osteoporosis. You may be screened starting at age 36 if you are at high risk. Talk with your health care provider about your test results, treatment options, and if necessary, the need for more tests. Vaccines Your health care provider may recommend certain vaccines, such  as:  Influenza vaccine. This is recommended every year.  Tetanus, diphtheria, and acellular pertussis (Tdap, Td) vaccine. You may need a Td booster every 10 years.  Varicella vaccine. You may need this if you have not been vaccinated.  Zoster vaccine. You may need this after age 85.  Measles, mumps, and rubella (MMR) vaccine. You may need at least one dose of MMR if you were born in 1957 or later. You may also need a second dose.  Pneumococcal 13-valent conjugate (PCV13) vaccine. One dose is recommended after age 60.  Pneumococcal polysaccharide (PPSV23) vaccine. One dose is recommended after age 67.  Meningococcal vaccine. You may need this if you have certain conditions.  Hepatitis A vaccine. You may need this if you have certain conditions or if you travel or work in places where you may be exposed to hepatitis A.  Hepatitis B vaccine. You may need this if you have certain conditions or if you travel or work in places where you may be exposed to hepatitis B.  Haemophilus influenzae type b (Hib) vaccine. You may need this if you have certain risk factors. Talk to your health care provider about which screenings and vaccines you need and how often you need them. This information is not intended to replace advice given to you by your health care provider. Make sure you discuss any questions you have with your health care provider. Document Released: 03/08/2015 Document Revised: 04/01/2017 Document Reviewed: 12/11/2014 Elsevier Interactive Patient Education  2019 [employer].

## 2018-04-21 NOTE — Progress Notes (Signed)
Subjective:   Robert Faulkner is a 73 y.o. male who presents for a subsequent Medicare Annual Wellness Visit.  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  He is married and lives at home with his wife.  He takes care of all of the household chores and is a caregiver for his wife as she is wheelchair bound.  They have 3 sons and 2 grandchildren who live locally.  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  He enjoys cutting wood that his family uses for heating.   Patient Care Team: Theodoro Clock as PCP - General (Physician Assistant) Danie Binder, MD as Consulting Physician (Gastroenterology)  Hospitalizations, surgeries, and ER visits in previous 12 months No hospitalizations, ER visits, or surgeries this past year.   Review of Systems    Patient reports that his overall health is unchanged compared to last year.  Cardiac Risk Factors include: advanced age (>57men, >1 women);male gender;obesity (BMI >30kg/m2)  All other systems negative       Current Medications (verified) Outpatient Encounter Medications as of 04/21/2018  Medication Sig  . aspirin EC 81 MG tablet Take 81 mg by mouth daily.  . meclizine (ANTIVERT) 25 MG tablet Take 1 tablet (25 mg total) by mouth 3 (three) times daily as needed for dizziness.  . Multiple Vitamin (MULTIVITAMIN WITH MINERALS) TABS tablet Take 1 tablet by mouth daily.  . naproxen sodium (ALEVE) 220 MG tablet Take 2 tablets (440 mg total) by mouth daily. TAKE 2 tab BID for the flare up of the bursitis  . propranolol (INDERAL) 40 MG tablet Take 1 tablet (40 mg total) by mouth 3 (three) times daily.   No facility-administered encounter medications on file as of 04/21/2018.     Allergies (verified) Patient has no known allergies.   History: Past Medical History:  Diagnosis Date  . Essential tremor   . Tremors of  nervous system    Past Surgical History:  Procedure Laterality Date  . COLONOSCOPY N/A 09/21/2014   Procedure: COLONOSCOPY;  Surgeon: Danie Binder, MD;  [facility];  Service: Endoscopy;  Laterality: N/A;  1200 - moved to 10:30 - office to notify   Family History  Problem Relation Age of Onset  . Stroke Mother   . Stroke Father   . Arthritis Son   . Hyperlipidemia Son   . Hypertension Son   . Colon cancer Neg Hx    Social History   Socioeconomic History  . Marital status: Married    Spouse name: Not on file  . Number of children: 3  . Years of education: Not on file  . Highest education level: 8th grade  Occupational History  . Occupation: retired    Comment: [employer]  . Financial resource strain: Not hard at all  . Food insecurity:    Worry: Never true    Inability: Never true  . Transportation needs:    Medical: No    Non-medical: No  Tobacco Use  . Smoking status: Former Smoker    Packs/day: 2.00    Years: 11.00    Pack years: 22.00    Last attempt to quit: 09/20/1977    Years since quitting: 40.6  . Smokeless tobacco: Never Used  Substance and Sexual Activity  . Alcohol use: No  . Drug use: No  . Sexual activity:  Not on file  Lifestyle  . Physical activity:    Days per week: 6 days    Minutes per session: 30 min  . Stress: Not at all  Relationships  . Social connections:    Talks on phone: Twice a week    Gets together: More than three times a week    Attends religious service: More than 4 times per year    Active member of club or organization: Yes    Attends meetings of clubs or organizations: More than 4 times per year    Relationship status: Married  Other Topics Concern  . Not on file  Social History Narrative  . Not on file     Clinical Intake:     Pain Score: 0-No pain                  Activities of Daily Living In your present state of health, do you have any difficulty  performing the following activities: 04/21/2018  Hearing? Y  Comment Wears hearing aids   Vision? N  Difficulty concentrating or making decisions? N  Walking or climbing stairs? N  Dressing or bathing? N  Doing errands, shopping? N  Preparing Food and eating ? N  Using the Toilet? N  In the past six months, have you accidently leaked urine? N  Do you have problems with loss of bowel control? N  Managing your Medications? N  Managing your Finances? N  Housekeeping or managing your Housekeeping? N  Some recent data might be hidden     Exercise Current Exercise Habits: Home exercise routine, Type of exercise: walking, Time (Minutes): 30, Frequency (Times/Week): 5, Weekly Exercise (Minutes/Week): 150, Intensity: Moderate  Diet Consumes 3 meals a day and 0 snacks a day.  The patient feels that he mostly follow a Regular diet.  Diet History  Patient states he usually eats eggs, toast, and sausage for breakfast and vegetables for lunch and supper.  He states he does not eat a significant amount of meat- usually a hamburger once per week.  He states he does not enjoy eating very much meat.  Recommended a diet of mostly non-starchy vegetables, fruits, whole grains, and lean proteins when he eats meats.  He states he does all the cooking at home because his wife if wheelchair bound.  They have access to all the food they need.   Depression Screen PHQ 2/9 Scores 04/21/2018 03/15/2017 09/16/2016 09/09/2016 10/29/2015 10/18/2015  PHQ - 2 Score 0 0 0 0 0 0     Fall Risk Fall Risk  04/21/2018 03/15/2017 09/16/2016 09/09/2016 10/29/2015  Falls in the past year? 0 No No No Yes  Number falls in past yr: - - - - 1  Injury with Fall? - - - - Yes  Risk Factor Category  - - - - -  Follow up - - - - -     Objective:    Today's Vitals   04/21/18 0908  BP: 113/64  Pulse: (!) 48  Weight: 225 lb (102.1 kg)  Height: 5\' 11"  (1.803 m)  PainSc: 0-No pain   Body mass index is 31.38 kg/m.  Advanced  Directives 04/21/2018 09/09/2016 09/21/2014 08/21/2014  Does Patient Have a Medical Advance Directive? Yes Yes No Yes  Type of Paramedic of [place];Living will Living will - Living will  Does patient want to make changes to medical advance directive? No - Patient declined Yes (MAU/Ambulatory/Procedural Areas - Information given) - -  Copy  of [place] in Chart? No - copy requested - - No - copy requested  Would patient like information on creating a medical advance directive? - - No - patient declined information -    Hearing/Vision  No hearing or vision deficits noted during visit.  Patient wears hearing aids and glasses.  Cognitive Function: MMSE - Mini Mental State Exam 04/21/2018 09/09/2016  Orientation to time 5 5  Orientation to Place 4 5  Registration 3 3  Attention/ Calculation 4 4  Recall 3 2  Language- name 2 objects 2 2  Language- repeat 1 1  Language- follow 3 step command 3 3  Language- read & follow direction 1 1  Write a sentence 1 1  Copy design 1 1  Total score 28 28           Immunizations and Health Maintenance Immunization History  Administered Date(s) Administered  . Influenza, High Dose Seasonal PF 11/25/2015, 12/21/2016  . Influenza-Unspecified 12/04/2017  . Pneumococcal Conjugate-13 09/09/2016  . Pneumococcal Polysaccharide-23 04/21/2018   Health Maintenance Due  Topic Date Due  . Hepatitis C Screening  08/30/1945  . TETANUS/TDAP  03/10/1962  . PNA vac Low Risk Adult (2 of 2 - PPSV23) 09/09/2017   Patient declined Tdap and Shingrix today. Hepatitis C screening recommended at next visit with Particia Nearing, PA.    Health Maintenance  Topic Date Due  . Hepatitis C Screening  Nov 23, 1945  . TETANUS/TDAP  03/10/1962  . PNA vac Low Risk Adult (2 of 2 - PPSV23) 09/09/2017  . COLONOSCOPY  09/20/2024  . INFLUENZA VACCINE  Completed        Assessment:   This is a routine wellness examination for  [place].    Plan:    Goals    . Exercise 3x per week (30 min per time)     Continue to exercise Daily                  Health Maintenance & Additional Screening Recommendations: Td vaccine Advanced directives: has an advanced directive - a copy HAS NOT been provided.  Lung: Low Dose CT Chest recommended if Age 35-80 years, 30 pack-year currently smoking OR have quit w/in 15years. Patient does not qualify. Hepatitis C Screening recommended: yes   Today's Orders Orders Placed This Encounter  Procedures  . Pneumococcal polysaccharide vaccine 23-valent greater than or equal to 2yo subcutaneous/IM    Keep f/u with Terald Sleeper, PA-C and any other specialty appointments you may have Continue current medications Move carefully to avoid falls. Use assistive devices like a cane or walker if needed. Aim for at least 150 minutes of moderate activity a week. This can be done with chair exercises if necessary. Read or work on puzzles daily Stay connected with friends and family  I have personally reviewed and noted the following in the patient's chart:   . Medical and social history . Use of alcohol, tobacco or illicit drugs  . Current medications and supplements . Functional ability and status . Nutritional status . Physical activity . Advanced directives . List of other physicians . Hospitalizations, surgeries, and ER visits in previous 12 months . Vitals . Screenings to include cognitive, depression, and falls . Referrals and appointments  In addition, I have reviewed and discussed with patient certain preventive protocols, quality metrics, and best practice recommendations. A written personalized care plan for preventive services as well as general preventive health recommendations were provided to patient.  Nolberto Hanlon, RN  04/21/2018

## 2018-05-03 ENCOUNTER — Ambulatory Visit (INDEPENDENT_AMBULATORY_CARE_PROVIDER_SITE_OTHER): Payer: Medicare Other | Admitting: Physician Assistant

## 2018-05-03 VITALS — BP 115/58 | HR 46 | Temp 97.2°F | Ht 71.0 in | Wt 225.8 lb

## 2018-05-03 DIAGNOSIS — Z0000 Encounter for general adult medical examination without abnormal findings: Secondary | ICD-10-CM | POA: Diagnosis not present

## 2018-05-03 DIAGNOSIS — M7021 Olecranon bursitis, right elbow: Secondary | ICD-10-CM

## 2018-05-03 DIAGNOSIS — Z0001 Encounter for general adult medical examination with abnormal findings: Secondary | ICD-10-CM | POA: Diagnosis not present

## 2018-05-03 DIAGNOSIS — G250 Essential tremor: Secondary | ICD-10-CM

## 2018-05-03 MED ORDER — PROPRANOLOL HCL 40 MG PO TABS: 40.0000 mg | ORAL_TABLET | Freq: Three times a day (TID) | ORAL | 3 refills | Status: DC

## 2018-05-03 MED ORDER — NAPROXEN SODIUM 220 MG PO TABS: 440.0000 mg | ORAL_TABLET | Freq: Every day | ORAL | Status: DC

## 2018-05-04 ENCOUNTER — Encounter: Payer: Self-pay | Admitting: Physician Assistant

## 2018-05-04 LAB — CMP14+EGFR
ALT: 25 IU/L (ref 0–44)
AST: 24 IU/L (ref 0–40)
Albumin/Globulin Ratio: 1.6 (ref 1.2–2.2)
Albumin: 4.2 g/dL (ref 3.7–4.7)
Alkaline Phosphatase: 74 IU/L (ref 39–117)
BUN/Creatinine Ratio: 12 (ref 10–24)
BUN: 12 mg/dL (ref 8–27)
Bilirubin Total: 0.7 mg/dL (ref 0.0–1.2)
CALCIUM: 9.7 mg/dL (ref 8.6–10.2)
CO2: 27 mmol/L (ref 20–29)
Chloride: 100 mmol/L (ref 96–106)
Creatinine, Ser: 1.02 mg/dL (ref 0.76–1.27)
GFR calc non Af Amer: 72 mL/min/{1.73_m2} (ref >59)
GFR, EST AFRICAN AMERICAN: 83 mL/min/{1.73_m2} (ref >59)
Globulin, Total: 2.6 g/dL (ref 1.5–4.5)
Glucose: 73 mg/dL (ref 65–99)
Potassium: 5.7 mmol/L — ABNORMAL HIGH (ref 3.5–5.2)
Sodium: 141 mmol/L (ref 134–144)
TOTAL PROTEIN: 6.8 g/dL (ref 6.0–8.5)

## 2018-05-04 LAB — CBC WITH DIFFERENTIAL/PLATELET
BASOS: 1 %
Basophils Absolute: 0.1 10*3/uL (ref 0.0–0.2)
EOS (ABSOLUTE): 0.3 10*3/uL (ref 0.0–0.4)
EOS: 4 %
HEMATOCRIT: 43.1 % (ref 37.5–51.0)
Hemoglobin: 14.7 g/dL (ref 13.0–17.7)
IMMATURE GRANS (ABS): 0 10*3/uL (ref 0.0–0.1)
Immature Granulocytes: 0 %
Lymphocytes Absolute: 1.9 10*3/uL (ref 0.7–3.1)
Lymphs: 28 %
MCH: 29.8 pg (ref 26.6–33.0)
MCHC: 34.1 g/dL (ref 31.5–35.7)
MCV: 87 fL (ref 79–97)
Monocytes Absolute: 0.8 10*3/uL (ref 0.1–0.9)
Monocytes: 11 %
Neutrophils Absolute: 3.9 10*3/uL (ref 1.4–7.0)
Neutrophils: 56 %
PLATELETS: 178 10*3/uL (ref 150–450)
RBC: 4.94 x10E6/uL (ref 4.14–5.80)
RDW: 12.6 % (ref 11.6–15.4)
WBC: 6.9 10*3/uL (ref 3.4–10.8)

## 2018-05-04 LAB — LIPID PANEL
Chol/HDL Ratio: 6.4 ratio — ABNORMAL HIGH (ref 0.0–5.0)
Cholesterol, Total: 235 mg/dL — ABNORMAL HIGH (ref 100–199)
HDL: 37 mg/dL — ABNORMAL LOW (ref >39)
LDL CALC: 159 mg/dL — AB (ref 0–99)
TRIGLYCERIDES: 193 mg/dL — AB (ref 0–149)
VLDL CHOLESTEROL CAL: 39 mg/dL (ref 5–40)

## 2018-05-04 LAB — PSA: PROSTATE SPECIFIC AG, SERUM: 0.7 ng/mL (ref 0.0–4.0)

## 2018-05-04 NOTE — Progress Notes (Signed)
BP (!) 115/58   Pulse (!) 46   Temp (!) 97.2 F (36.2 C) (Oral)   Ht '5\' 11"'  (1.803 m)   Wt 225 lb 12.8 oz (102.4 kg)   BMI 31.49 kg/m    Subjective:    Patient ID: Robert Faulkner, male    DOB: 1945-10-26, 73 y.o.   MRN: [id number]  HPI: Robert Faulkner is a 73 y.o. male presenting on 05/03/2018 for Annual Exam and Medication Refill  This patient comes in for annual well physical examination. All medications are reviewed today. There are no reports of any problems with the medications. All of the medical conditions are reviewed and updated.  Lab work is reviewed and will be ordered as medically necessary. There are no new problems reported with today's visit.  Patient reports doing well overall.  His wife has become quite invalid.  She has multiple severe arthritis joints some have been replaced.  She had a fall about 4 months ago and has had a lot of difficulty moving around since then.  Past Medical History:  Diagnosis Date  . Essential tremor   . Tremors of nervous system    Relevant past medical, surgical, family and social history reviewed and updated as indicated. Interim medical history since our last visit reviewed. Allergies and medications reviewed and updated. DATA REVIEWED: CHART IN EPIC  Family History reviewed for pertinent findings.  Review of Systems  Constitutional: Negative.  Negative for appetite change and fatigue.  HENT: Negative.   Eyes: Negative.  Negative for pain and visual disturbance.  Respiratory: Negative.  Negative for cough, chest tightness, shortness of breath and wheezing.   Cardiovascular: Negative.  Negative for chest pain, palpitations and leg swelling.  Gastrointestinal: Negative.  Negative for abdominal pain, diarrhea, nausea and vomiting.  Endocrine: Negative.   Genitourinary: Negative.   Musculoskeletal: Negative.   Skin: Negative.  Negative for color change and rash.  Neurological: Negative.  Negative for weakness, numbness and  headaches.  Psychiatric/Behavioral: Negative.     Allergies as of 05/03/2018   No Known Allergies     Medication List       Accurate as of May 03, 2018 11:59 PM. Always use your most recent med list.        aspirin EC 81 MG tablet Take 81 mg by mouth daily.   meclizine 25 MG tablet Commonly known as:  ANTIVERT Take 1 tablet (25 mg total) by mouth 3 (three) times daily as needed for dizziness.   multivitamin with minerals Tabs tablet Take 1 tablet by mouth daily.   naproxen sodium 220 MG tablet Commonly known as:  Aleve Take 2 tablets (440 mg total) by mouth daily. TAKE 2 tab BID for the flare up of the bursitis   propranolol 40 MG tablet Commonly known as:  INDERAL Take 1 tablet (40 mg total) by mouth 3 (three) times daily.          Objective:    BP (!) 115/58   Pulse (!) 46   Temp (!) 97.2 F (36.2 C) (Oral)   Ht '5\' 11"'  (1.803 m)   Wt 225 lb 12.8 oz (102.4 kg)   BMI 31.49 kg/m   No Known Allergies  Wt Readings from Last 3 Encounters:  05/03/18 225 lb 12.8 oz (102.4 kg)  04/21/18 225 lb (102.1 kg)  03/15/17 224 lb (101.6 kg)    Physical Exam Constitutional:      Appearance: He is well-developed.  HENT:     Head:  Normocephalic and atraumatic.  Eyes:     Conjunctiva/sclera: Conjunctivae normal.     Pupils: Pupils are equal, round, and reactive to light.  Neck:     Musculoskeletal: Normal range of motion and neck supple.  Cardiovascular:     Rate and Rhythm: Normal rate and regular rhythm.     Heart sounds: Normal heart sounds.  Pulmonary:     Effort: Pulmonary effort is normal.     Breath sounds: Normal breath sounds.  Abdominal:     General: Bowel sounds are normal.     Palpations: Abdomen is soft.  Musculoskeletal: Normal range of motion.  Skin:    General: Skin is warm and dry.  Neurological:     Mental Status: He is oriented to person, place, and time.     Motor: Tremor present.     Comments: Hands, mild     Results for orders  placed or performed in visit on 05/03/18  CBC with Differential/Platelet  Result Value Ref Range   WBC 6.9 3.4 - 10.8 x10E3/uL   RBC 4.94 4.14 - 5.80 x10E6/uL   Hemoglobin 14.7 13.0 - 17.7 g/dL   Hematocrit 43.1 37.5 - 51.0 %   MCV 87 79 - 97 fL   MCH 29.8 26.6 - 33.0 pg   MCHC 34.1 31.5 - 35.7 g/dL   RDW 12.6 11.6 - 15.4 %   Platelets 178 150 - 450 x10E3/uL   Neutrophils 56 Not Estab. %   Lymphs 28 Not Estab. %   Monocytes 11 Not Estab. %   Eos 4 Not Estab. %   Basos 1 Not Estab. %   Neutrophils Absolute 3.9 1.4 - 7.0 x10E3/uL   Lymphocytes Absolute 1.9 0.7 - 3.1 x10E3/uL   Monocytes Absolute 0.8 0.1 - 0.9 x10E3/uL   EOS (ABSOLUTE) 0.3 0.0 - 0.4 x10E3/uL   Basophils Absolute 0.1 0.0 - 0.2 x10E3/uL   Immature Granulocytes 0 Not Estab. %   Immature Grans (Abs) 0.0 0.0 - 0.1 x10E3/uL  CMP14+EGFR  Result Value Ref Range   Glucose 73 65 - 99 mg/dL   BUN 12 8 - 27 mg/dL   Creatinine, Ser 1.02 0.76 - 1.27 mg/dL   GFR calc non Af Amer 72 >59 mL/min/1.73   GFR calc Af Amer 83 >59 mL/min/1.73   BUN/Creatinine Ratio 12 10 - 24   Sodium 141 134 - 144 mmol/L   Potassium 5.7 (H) 3.5 - 5.2 mmol/L   Chloride 100 96 - 106 mmol/L   CO2 27 20 - 29 mmol/L   Calcium 9.7 8.6 - 10.2 mg/dL   Total Protein 6.8 6.0 - 8.5 g/dL   Albumin 4.2 3.7 - 4.7 g/dL   Globulin, Total 2.6 1.5 - 4.5 g/dL   Albumin/Globulin Ratio 1.6 1.2 - 2.2   Bilirubin Total 0.7 0.0 - 1.2 mg/dL   Alkaline Phosphatase 74 39 - 117 IU/L   AST 24 0 - 40 IU/L   ALT 25 0 - 44 IU/L  Lipid panel  Result Value Ref Range   Cholesterol, Total 235 (H) 100 - 199 mg/dL   Triglycerides 193 (H) 0 - 149 mg/dL   HDL 37 (L) >39 mg/dL   VLDL Cholesterol Cal 39 5 - 40 mg/dL   LDL Calculated 159 (H) 0 - 99 mg/dL   Chol/HDL Ratio 6.4 (H) 0.0 - 5.0 ratio  PSA  Result Value Ref Range   Prostate Specific Ag, Serum 0.7 0.0 - 4.0 ng/mL      Assessment & Plan:  1. Well adult exam - CBC with Differential/Platelet - CMP14+EGFR - Lipid  panel - PSA  2. Olecranon bursitis of right elbow - naproxen sodium (ALEVE) 220 MG tablet; Take 2 tablets (440 mg total) by mouth daily. TAKE 2 tab BID for the flare up of the bursitis  3. Tremor, essential - propranolol (INDERAL) 40 MG tablet; Take 1 tablet (40 mg total) by mouth 3 (three) times daily.  Dispense: 270 tablet; Refill: 3   Continue all other maintenance medications as listed above.  Follow up plan: No follow-ups on file.  Educational handout given for [place] PA-C [place] [address]  [place], [place] [address] [phone]   05/04/2018, 10:04 AM

## 2018-06-21 NOTE — Progress Notes (Signed)
REVIEWED-NO ADDITIONAL RECOMMENDATIONS. 

## 2018-12-05 DIAGNOSIS — B078 Other viral warts: Secondary | ICD-10-CM | POA: Diagnosis not present

## 2018-12-05 DIAGNOSIS — L820 Inflamed seborrheic keratosis: Secondary | ICD-10-CM | POA: Diagnosis not present

## 2018-12-07 ENCOUNTER — Ambulatory Visit: Payer: Medicare Other

## 2018-12-28 ENCOUNTER — Ambulatory Visit (INDEPENDENT_AMBULATORY_CARE_PROVIDER_SITE_OTHER): Payer: Medicare Other

## 2018-12-28 ENCOUNTER — Other Ambulatory Visit: Payer: Self-pay

## 2018-12-28 DIAGNOSIS — Z23 Encounter for immunization: Secondary | ICD-10-CM

## 2019-01-14 DIAGNOSIS — J029 Acute pharyngitis, unspecified: Secondary | ICD-10-CM | POA: Diagnosis not present

## 2019-01-14 DIAGNOSIS — J988 Other specified respiratory disorders: Secondary | ICD-10-CM | POA: Diagnosis not present

## 2019-01-14 DIAGNOSIS — R509 Fever, unspecified: Secondary | ICD-10-CM | POA: Diagnosis not present

## 2019-01-18 ENCOUNTER — Other Ambulatory Visit: Payer: Self-pay | Admitting: Physician Assistant

## 2019-01-18 MED ORDER — PREDNISONE 10 MG (21) PO TBPK: ORAL_TABLET | ORAL | 0 refills | Status: DC

## 2019-01-21 ENCOUNTER — Inpatient Hospital Stay (HOSPITAL_COMMUNITY)
Admission: EM | Admit: 2019-01-21 | Discharge: 2019-01-26 | DRG: 177 | Disposition: A | Discharge status: Home Health | Payer: Medicare Other | Attending: Internal Medicine | Admitting: Internal Medicine

## 2019-01-21 ENCOUNTER — Other Ambulatory Visit: Payer: Self-pay

## 2019-01-21 ENCOUNTER — Encounter (HOSPITAL_COMMUNITY): Payer: Self-pay

## 2019-01-21 ENCOUNTER — Emergency Department (HOSPITAL_COMMUNITY): Payer: Medicare Other

## 2019-01-21 DIAGNOSIS — J9601 Acute respiratory failure with hypoxia: Secondary | ICD-10-CM | POA: Diagnosis not present

## 2019-01-21 DIAGNOSIS — E669 Obesity, unspecified: Secondary | ICD-10-CM | POA: Diagnosis not present

## 2019-01-21 DIAGNOSIS — G250 Essential tremor: Secondary | ICD-10-CM | POA: Diagnosis not present

## 2019-01-21 DIAGNOSIS — I959 Hypotension, unspecified: Secondary | ICD-10-CM | POA: Diagnosis not present

## 2019-01-21 DIAGNOSIS — Z87891 Personal history of nicotine dependence: Secondary | ICD-10-CM | POA: Diagnosis not present

## 2019-01-21 DIAGNOSIS — R0902 Hypoxemia: Secondary | ICD-10-CM | POA: Diagnosis not present

## 2019-01-21 DIAGNOSIS — Z79899 Other long term (current) drug therapy: Secondary | ICD-10-CM

## 2019-01-21 DIAGNOSIS — E860 Dehydration: Secondary | ICD-10-CM | POA: Diagnosis not present

## 2019-01-21 DIAGNOSIS — I10 Essential (primary) hypertension: Secondary | ICD-10-CM | POA: Diagnosis not present

## 2019-01-21 DIAGNOSIS — K529 Noninfective gastroenteritis and colitis, unspecified: Secondary | ICD-10-CM | POA: Diagnosis present

## 2019-01-21 DIAGNOSIS — R112 Nausea with vomiting, unspecified: Secondary | ICD-10-CM | POA: Diagnosis not present

## 2019-01-21 DIAGNOSIS — J069 Acute upper respiratory infection, unspecified: Secondary | ICD-10-CM | POA: Diagnosis not present

## 2019-01-21 DIAGNOSIS — U071 COVID-19: Principal | ICD-10-CM | POA: Diagnosis present

## 2019-01-21 DIAGNOSIS — R197 Diarrhea, unspecified: Secondary | ICD-10-CM | POA: Diagnosis not present

## 2019-01-21 DIAGNOSIS — R531 Weakness: Secondary | ICD-10-CM | POA: Diagnosis not present

## 2019-01-21 DIAGNOSIS — E1159 Type 2 diabetes mellitus with other circulatory complications: Secondary | ICD-10-CM | POA: Diagnosis present

## 2019-01-21 DIAGNOSIS — J1289 Other viral pneumonia: Secondary | ICD-10-CM | POA: Diagnosis present

## 2019-01-21 DIAGNOSIS — M2550 Pain in unspecified joint: Secondary | ICD-10-CM | POA: Diagnosis not present

## 2019-01-21 DIAGNOSIS — Z6831 Body mass index (BMI) 31.0-31.9, adult: Secondary | ICD-10-CM | POA: Diagnosis not present

## 2019-01-21 DIAGNOSIS — Z7982 Long term (current) use of aspirin: Secondary | ICD-10-CM

## 2019-01-21 DIAGNOSIS — R918 Other nonspecific abnormal finding of lung field: Secondary | ICD-10-CM | POA: Diagnosis not present

## 2019-01-21 DIAGNOSIS — Z7401 Bed confinement status: Secondary | ICD-10-CM | POA: Diagnosis not present

## 2019-01-21 DIAGNOSIS — R1111 Vomiting without nausea: Secondary | ICD-10-CM | POA: Diagnosis not present

## 2019-01-21 DIAGNOSIS — R5381 Other malaise: Secondary | ICD-10-CM | POA: Diagnosis not present

## 2019-01-21 LAB — CBC WITH DIFFERENTIAL/PLATELET
Abs Immature Granulocytes: 0.06 10*3/uL (ref 0.00–0.07)
Basophils Absolute: 0 10*3/uL (ref 0.0–0.1)
Basophils Relative: 0 %
Eosinophils Absolute: 0 10*3/uL (ref 0.0–0.5)
Eosinophils Relative: 0 %
HCT: 42.6 % (ref 39.0–52.0)
Hemoglobin: 14.3 g/dL (ref 13.0–17.0)
Immature Granulocytes: 1 %
Lymphocytes Relative: 10 %
Lymphs Abs: 1 10*3/uL (ref 0.7–4.0)
MCH: 30.7 pg (ref 26.0–34.0)
MCHC: 33.6 g/dL (ref 30.0–36.0)
MCV: 91.4 fL (ref 80.0–100.0)
Monocytes Absolute: 0.6 10*3/uL (ref 0.1–1.0)
Monocytes Relative: 6 %
Neutro Abs: 8.2 10*3/uL — ABNORMAL HIGH (ref 1.7–7.7)
Neutrophils Relative %: 83 %
Platelets: 162 10*3/uL (ref 150–400)
RBC: 4.66 MIL/uL (ref 4.22–5.81)
RDW: 12 % (ref 11.5–15.5)
WBC: 9.9 10*3/uL (ref 4.0–10.5)
nRBC: 0 % (ref 0.0–0.2)

## 2019-01-21 LAB — LACTATE DEHYDROGENASE: LDH: 245 U/L — ABNORMAL HIGH (ref 98–192)

## 2019-01-21 LAB — PROCALCITONIN: Procalcitonin: <0.1 ng/mL

## 2019-01-21 LAB — FIBRINOGEN: Fibrinogen: 664 mg/dL — ABNORMAL HIGH (ref 210–475)

## 2019-01-21 LAB — D-DIMER, QUANTITATIVE: D-Dimer, Quant: 1.7 ug/mL-FEU — ABNORMAL HIGH (ref 0.00–0.50)

## 2019-01-21 LAB — COMPREHENSIVE METABOLIC PANEL
ALT: 29 U/L (ref 0–44)
AST: 35 U/L (ref 15–41)
Albumin: 3.6 g/dL (ref 3.5–5.0)
Alkaline Phosphatase: 49 U/L (ref 38–126)
Anion gap: 9 (ref 5–15)
BUN: 26 mg/dL — ABNORMAL HIGH (ref 8–23)
CO2: 23 mmol/L (ref 22–32)
Calcium: 8.4 mg/dL — ABNORMAL LOW (ref 8.9–10.3)
Chloride: 105 mmol/L (ref 98–111)
Creatinine, Ser: 1.09 mg/dL (ref 0.61–1.24)
GFR calc Af Amer: >60 mL/min (ref >60)
GFR calc non Af Amer: >60 mL/min (ref >60)
Glucose, Bld: 116 mg/dL — ABNORMAL HIGH (ref 70–99)
Potassium: 3.9 mmol/L (ref 3.5–5.1)
Sodium: 137 mmol/L (ref 135–145)
Total Bilirubin: 1.5 mg/dL — ABNORMAL HIGH (ref 0.3–1.2)
Total Protein: 6.8 g/dL (ref 6.5–8.1)

## 2019-01-21 LAB — C-REACTIVE PROTEIN: CRP: 11 mg/dL — ABNORMAL HIGH (ref <1.0)

## 2019-01-21 LAB — TRIGLYCERIDES: Triglycerides: 61 mg/dL (ref <150)

## 2019-01-21 LAB — POC SARS CORONAVIRUS 2 AG -  ED: SARS Coronavirus 2 Ag: POSITIVE — AB

## 2019-01-21 LAB — FERRITIN: Ferritin: 246 ng/mL (ref 24–336)

## 2019-01-21 LAB — LACTIC ACID, PLASMA: Lactic Acid, Venous: 1.4 mmol/L (ref 0.5–1.9)

## 2019-01-21 LAB — ABO/RH: ABO/RH(D): O POS

## 2019-01-21 LAB — MAGNESIUM: Magnesium: 2 mg/dL (ref 1.7–2.4)

## 2019-01-21 MED ORDER — ENOXAPARIN SODIUM 40 MG/0.4ML ~~LOC~~ SOLN
40.0000 mg | SUBCUTANEOUS | Status: DC
  Administered 2019-01-21 – 2019-01-26 (×5): 40 mg via SUBCUTANEOUS
  Filled 2019-01-21 (×6): qty 0.4

## 2019-01-21 MED ORDER — POTASSIUM CHLORIDE IN NACL 20-0.9 MEQ/L-% IV SOLN: INTRAVENOUS | Status: DC

## 2019-01-21 MED ORDER — SODIUM CHLORIDE 0.9 % IV SOLN
100.0000 mg | INTRAVENOUS | Status: AC
  Administered 2019-01-22 – 2019-01-25 (×4): 100 mg via INTRAVENOUS
  Filled 2019-01-21 (×4): qty 100

## 2019-01-21 MED ORDER — METHYLPREDNISOLONE SODIUM SUCC 125 MG IJ SOLR
0.5000 mg/kg | Freq: Two times a day (BID) | INTRAMUSCULAR | Status: DC
  Administered 2019-01-21 – 2019-01-22 (×2): 51.25 mg via INTRAVENOUS
  Filled 2019-01-21 (×2): qty 2

## 2019-01-21 MED ORDER — GUAIFENESIN-DM 100-10 MG/5ML PO SYRP: 10.0000 mL | ORAL_SOLUTION | ORAL | Status: DC | PRN

## 2019-01-21 MED ORDER — ONDANSETRON HCL 4 MG/2ML IJ SOLN
4.0000 mg | Freq: Three times a day (TID) | INTRAMUSCULAR | Status: DC | PRN
  Filled 2019-01-21: qty 2

## 2019-01-21 MED ORDER — PROPRANOLOL HCL 40 MG PO TABS
40.0000 mg | ORAL_TABLET | Freq: Three times a day (TID) | ORAL | Status: DC
  Administered 2019-01-21 – 2019-01-22 (×4): 40 mg via ORAL
  Filled 2019-01-21 (×6): qty 1

## 2019-01-21 MED ORDER — SODIUM CHLORIDE 0.9 % IV BOLUS
1000.0000 mL | Freq: Once | INTRAVENOUS | Status: AC
  Administered 2019-01-21: 10:00:00 1000 mL via INTRAVENOUS

## 2019-01-21 MED ORDER — FAMOTIDINE IN NACL 20-0.9 MG/50ML-% IV SOLN
20.0000 mg | INTRAVENOUS | Status: DC
  Administered 2019-01-21 – 2019-01-23 (×3): 20 mg via INTRAVENOUS
  Filled 2019-01-21 (×3): qty 50

## 2019-01-21 MED ORDER — ZINC SULFATE 220 (50 ZN) MG PO CAPS
220.0000 mg | ORAL_CAPSULE | Freq: Every day | ORAL | Status: DC
  Administered 2019-01-22 – 2019-01-26 (×5): 220 mg via ORAL
  Filled 2019-01-21 (×5): qty 1

## 2019-01-21 MED ORDER — ONDANSETRON HCL 4 MG PO TABS: 4.0000 mg | ORAL_TABLET | Freq: Four times a day (QID) | ORAL | Status: DC | PRN

## 2019-01-21 MED ORDER — VITAMIN C 500 MG PO TABS
500.0000 mg | ORAL_TABLET | Freq: Every day | ORAL | Status: DC
  Administered 2019-01-22 – 2019-01-26 (×5): 500 mg via ORAL
  Filled 2019-01-21 (×5): qty 1

## 2019-01-21 MED ORDER — SODIUM CHLORIDE 0.9 % IV SOLN
200.0000 mg | Freq: Once | INTRAVENOUS | Status: AC
  Administered 2019-01-21: 200 mg via INTRAVENOUS
  Filled 2019-01-21: qty 40

## 2019-01-21 MED ORDER — ACETAMINOPHEN 325 MG PO TABS: 650.0000 mg | ORAL_TABLET | Freq: Four times a day (QID) | ORAL | Status: DC | PRN

## 2019-01-21 MED ORDER — ONDANSETRON HCL 4 MG/2ML IJ SOLN
4.0000 mg | Freq: Four times a day (QID) | INTRAMUSCULAR | Status: DC | PRN
  Administered 2019-01-21: 15:00:00 4 mg via INTRAVENOUS

## 2019-01-21 MED ORDER — SODIUM CHLORIDE 0.9 % IV SOLN
Freq: Once | INTRAVENOUS | Status: AC
  Administered 2019-01-21: 14:00:00 via INTRAVENOUS

## 2019-01-21 NOTE — ED Triage Notes (Signed)
Pt received positive covid on the 21st. Pt reports vomiting every day since he has been since. Reports weakness.

## 2019-01-21 NOTE — ED Provider Notes (Signed)
[facility] EMERGENCY DEPARTMENT Provider Note   CSN: [id number] Arrival date & time: 01/21/19  1012     History   Chief Complaint Chief Complaint  Patient presents with  . Emesis  . Covid positive    HPI Robert Faulkner is a 73 y.o. male.  He tested positive for Covid 7 days ago.  He said he has been vomiting daily twice a day since then.  No known fevers.  Some headache and body ache.  Getting generally weaker and shaky here.  No falls.  Wife sick with Covid 2 and currently admitted at [facility].     The history is provided by the patient and the EMS personnel.  Emesis Severity:  Moderate Duration:  1 week Timing:  Sporadic Number of daily episodes:  2-3 Quality:  Stomach contents Progression:  Unchanged Chronicity:  New Recent urination:  Normal Relieved by:  Nothing Worsened by:  Nothing Ineffective treatments:  None tried Associated symptoms: diarrhea, fever, headaches and myalgias   Associated symptoms: no abdominal pain, no cough and no sore throat   Risk factors: sick contacts     Past Medical History:  Diagnosis Date  . Essential tremor   . Tremors of nervous system     Patient Active Problem List   Diagnosis Date Noted  . Olecranon bursa abscess 10/18/2015  . Olecranon bursitis 10/18/2015  . Body mass index 31.0-31.9, adult 10/18/2015  . Special screening for malignant neoplasms, colon   . Encounter for screening colonoscopy 09/11/2014    Past Surgical History:  Procedure Laterality Date  . COLONOSCOPY N/A 09/21/2014   Procedure: COLONOSCOPY;  Surgeon: Danie Binder, MD;  Location: [facility];  Service: Endoscopy;  Laterality: N/A;  1200 - moved to 10:30 - office to notify        Home Medications    Prior to Admission medications   Medication Sig Start Date End Date Taking? Authorizing Provider  aspirin EC 81 MG tablet Take 81 mg by mouth daily.    [provider]  meclizine (ANTIVERT) 25 MG tablet Take 1 tablet (25 mg  total) by mouth 3 (three) times daily as needed for dizziness. 03/15/17   Hassell Done Mary-Margaret, FNP  Multiple Vitamin (MULTIVITAMIN WITH MINERALS) TABS tablet Take 1 tablet by mouth daily.    [provider]  naproxen sodium (ALEVE) 220 MG tablet Take 2 tablets (440 mg total) by mouth daily. TAKE 2 tab BID for the flare up of the bursitis 05/03/18   Terald Sleeper, PA-C  predniSONE (STERAPRED UNI-PAK 21 TAB) 10 MG (21) TBPK tablet As directed x 6 days 01/18/19   Terald Sleeper, PA-C  propranolol (INDERAL) 40 MG tablet Take 1 tablet (40 mg total) by mouth 3 (three) times daily. 05/03/18   Terald Sleeper, PA-C    Family History Family History  Problem Relation Age of Onset  . Stroke Mother   . Stroke Father   . Arthritis Son   . Hyperlipidemia Son   . Hypertension Son   . Colon cancer Neg Hx     Social History Social History   Tobacco Use  . Smoking status: Former Smoker    Packs/day: 2.00    Years: 11.00    Pack years: 22.00    Quit date: 09/20/1977    Years since quitting: 41.3  . Smokeless tobacco: Never Used  Substance Use Topics  . Alcohol use: No  . Drug use: No     Allergies   Patient  has no known allergies.   Review of Systems Review of Systems  Constitutional: Positive for fever.  HENT: Negative for sore throat.   Eyes: Negative for visual disturbance.  Respiratory: Negative for cough.   Cardiovascular: Negative for chest pain.  Gastrointestinal: Positive for diarrhea, nausea and vomiting. Negative for abdominal pain.  Genitourinary: Negative for dysuria.  Musculoskeletal: Positive for myalgias.  Skin: Negative for rash.  Neurological: Positive for weakness and headaches.     Physical Exam Updated Vital Signs Pulse 64   Temp 99.5 F (37.5 C)   Ht 5\' 11"  (1.803 m)   Wt 102.1 kg   SpO2 92%   BMI 31.38 kg/m   Physical Exam Vitals signs and nursing note reviewed.  Constitutional:      Appearance: He is well-developed.  HENT:     Head:  Normocephalic and atraumatic.  Eyes:     Conjunctiva/sclera: Conjunctivae normal.  Neck:     Musculoskeletal: Neck supple.  Cardiovascular:     Rate and Rhythm: Normal rate and regular rhythm.     Heart sounds: No murmur.  Pulmonary:     Effort: Pulmonary effort is normal. No respiratory distress.     Breath sounds: Normal breath sounds.  Abdominal:     Palpations: Abdomen is soft.     Tenderness: There is no abdominal tenderness. There is no guarding or rebound.  Musculoskeletal: Normal range of motion.     Right lower leg: No edema.     Left lower leg: No edema.  Skin:    General: Skin is warm and dry.     Capillary Refill: Capillary refill takes less than 2 seconds.  Neurological:     General: No focal deficit present.     Mental Status: He is alert.      ED Treatments / Results  Labs (all labs ordered are listed, but only abnormal results are displayed) Labs Reviewed  CBC WITH DIFFERENTIAL/PLATELET - Abnormal; Notable for the following components:      Result Value   Neutro Abs 8.2 (*)    All other components within normal limits  COMPREHENSIVE METABOLIC PANEL - Abnormal; Notable for the following components:   Glucose, Bld 116 (*)    BUN 26 (*)    Calcium 8.4 (*)    Total Bilirubin 1.5 (*)    All other components within normal limits  D-DIMER, QUANTITATIVE (NOT AT [facility]) - Abnormal; Notable for the following components:   D-Dimer, Quant 1.70 (*)    All other components within normal limits  LACTATE DEHYDROGENASE - Abnormal; Notable for the following components:   LDH 245 (*)    All other components within normal limits  FIBRINOGEN - Abnormal; Notable for the following components:   Fibrinogen 664 (*)    All other components within normal limits  C-REACTIVE PROTEIN - Abnormal; Notable for the following components:   CRP 11.0 (*)    All other components within normal limits  POC SARS CORONAVIRUS 2 AG -  ED - Abnormal; Notable for the following components:    SARS Coronavirus 2 Ag POSITIVE (*)    All other components within normal limits  CULTURE, BLOOD (ROUTINE X 2)  CULTURE, BLOOD (ROUTINE X 2)  LACTIC ACID, PLASMA  PROCALCITONIN  FERRITIN  TRIGLYCERIDES  MAGNESIUM  ABO/RH    EKG EKG Interpretation  Date/Time:  Saturday January 21 2019 10:24:07 EST Ventricular Rate:  66 PR Interval:    QRS Duration: 131 QT Interval:  414 QTC Calculation: 434 R  Axis:   58 Text Interpretation: Sinus rhythm Right bundle branch block Nonspecific T abnormalities, lateral leads Baseline wander in lead(s) V3 No old tracing to compare Confirmed by Aletta Edouard [phone]) on 01/21/2019 10:32:33 AM   Radiology Dg Chest Port 1 View  Result Date: 01/21/2019 CLINICAL DATA:  73 year old who tested COVID-19 positive one week ago and has had vomiting since that time. He presents with generalized weakness. EXAM: PORTABLE CHEST 1 VIEW COMPARISON:  None. FINDINGS: Cardiac silhouette upper normal in size for AP portable technique. Scattered streaky and patchy airspace opacities are present in both lungs. No confluent airspace consolidation. No pleural effusions. IMPRESSION: Scattered streaky and patchy airspace opacities in both lungs, possibly indicating bronchopneumonia, though may just represent scarring (there are no prior chest x-rays for comparison). Electronically Signed   By: Evangeline Dakin M.D.   On: 01/21/2019 12:05    Procedures Procedures (including critical care time)  Medications Ordered in ED Medications  enoxaparin (LOVENOX) injection 40 mg (40 mg Subcutaneous Given 01/21/19 1504)  0.9 % NaCl with KCl 20 mEq/ L  infusion ( Intravenous Not Given 01/21/19 1610)  methylPREDNISolone sodium succinate (SOLU-MEDROL) 125 mg/2 mL injection 51.25 mg (51.25 mg Intravenous Given 01/21/19 1501)  vitamin C (ASCORBIC ACID) tablet 500 mg (500 mg Oral Not Given 01/21/19 1610)  zinc sulfate capsule 220 mg (220 mg Oral Not Given 01/21/19 1610)   guaiFENesin-dextromethorphan (ROBITUSSIN DM) 100-10 MG/5ML syrup 10 mL (has no administration in time range)  acetaminophen (TYLENOL) tablet 650 mg (has no administration in time range)  ondansetron (ZOFRAN) tablet 4 mg ( Oral See Alternative 01/21/19 1503)    Or  ondansetron (ZOFRAN) injection 4 mg (4 mg Intravenous Given 01/21/19 1503)  famotidine (PEPCID) IVPB 20 mg premix (20 mg Intravenous New Bag/Given 01/21/19 1506)  remdesivir 200 mg in sodium chloride 0.9 % 250 mL IVPB (has no administration in time range)  remdesivir 100 mg in sodium chloride 0.9 % 250 mL IVPB (has no administration in time range)  sodium chloride 0.9 % bolus 1,000 mL (0 mLs Intravenous Stopped 01/21/19 1259)  0.9 %  sodium chloride infusion ( Intravenous New Bag/Given 01/21/19 1406)     Initial Impression / Assessment and Plan / ED Course  I have reviewed the triage vital signs and the nursing notes.  Pertinent labs & imaging results that were available during my care of the patient were reviewed by me and considered in my medical decision making (see chart for details).  Clinical Course as of Jan 21 1648  Sat Nov 28, 624  5026 73 year old male Covid +1-week ago here with 1 week of vomiting diarrhea and weakness.  Low-grade fever here.  Differential includes Covid, dehydration, metabolic derangement, pneumonia, sepsis.   [MB]  [id number] Chest x-ray interpreted by me as no pneumothorax question right lower lobe infiltrate.  Awaiting radiology reading.   [MB]  1229 Discussed with Dr. Dyann Kief from hospitalist service who will evaluate the patient for possible admission.   [MB]  [id number] Patient is Covid positive by rapid point-of-care test.  He is now requiring some oxygen.   [MB]    Clinical Course User Index [MB] Hayden Rasmussen, MD   [name] was evaluated in Emergency Department on 01/21/2019 for the symptoms described in the history of present illness. He was evaluated in the context of the global  COVID-19 pandemic, which necessitated consideration that the patient might be at risk for infection with the SARS-CoV-2 virus that causes COVID-19. Institutional protocols and algorithms  that pertain to the evaluation of patients at risk for COVID-19 are in a state of rapid change based on information released by regulatory bodies including the CDC and federal and state organizations. These policies and algorithms were followed during the patient's care in the ED.      Final Clinical Impressions(s) / ED Diagnoses   Final diagnoses:  COVID-19 virus infection  Dehydration  Nausea vomiting and diarrhea    ED Discharge Orders    None       Hayden Rasmussen, MD 01/21/19 1650

## 2019-01-21 NOTE — Progress Notes (Signed)
Pt was tx here from [facility] for COVID. He has a positive CXR. ALT is wnl.   Remdesivir 200mg  IV x1 then 100mg  IV q24 x4 F/u ALT  Onnie Boer, PharmD, BCIDP, AAHIVP, CPP Infectious Disease Pharmacist 01/21/2019 3:59 PM

## 2019-01-21 NOTE — ED Notes (Signed)
Patient placed on 2L of O2 via [place]. O2 increased from 85 to 96%.

## 2019-01-21 NOTE — Plan of Care (Signed)
  Problem: Education: Goal: Knowledge of risk factors and measures for prevention of condition will improve Outcome: Progressing   Problem: Coping: Goal: Psychosocial and spiritual needs will be supported Outcome: Progressing   Problem: Respiratory: Goal: Will maintain a patent airway Outcome: Progressing Goal: Complications related to the disease process, condition or treatment will be avoided or minimized Outcome: Progressing   

## 2019-01-21 NOTE — ED Notes (Signed)
Will call Robert Faulkner at [facility] when confirmation is given on [facility] arrival time.  [phone]

## 2019-01-21 NOTE — H&P (Signed)
History and Physical    Robert Faulkner [id number] DOB: 07/30/1945 DOA: 01/21/2019  Referring MD/NP/PA: Dr. Michael Butler PCP: Jones, Angel S, PA-C  Patient coming from: Home  Chief Complaint: General malaise, nausea/vomiting and URI symptoms.  HPI: Robert Faulkner is a 73 y.o. male with past medical history significant for essential hypertension and tremors; who presented to the emergency department complaining of general malaise, nausea/vomiting, intermittent episode of diarrhea and URI symptoms.  Patient symptoms started approximately over a week ago he was tested on 01/14/19 and turned out to be positive for coronavirus.  Patient was discharged home with instructions to isolate himself and monitor symptoms; steroids (prednisone) were provided.  Patient reports no fever, no chills, no chest pain, no hematemesis, no hematochezia, no melena, no dysuria, no hematuria, no abdominal pain.  He expressed having sudden inability to keep things down secondary to ongoing nausea and vomiting (specifically is present 2-3 emesis per day), feeling significantly weak, deconditioned and just not good.  He reported that his wife was recently admitted secondary to worsening coronavirus symptoms as well.  Nonproductive cough intermittently was also suppressed.  In the ED patient was found mildly tachypneic with minimal exertion; chest x-ray with bilateral infiltrates, no fever, normal WBCs, inflammatory markers essentially with elevated LDH, CRP and ESR.  Normal electrolytes, LFTs and renal function.  Patient desaturated to 86-87% percent on room air with acute stabilization of his oxygen saturation on 2 L nasal cannula.  [facility] consulted to admit patient for further evaluation and management of coronavirus infection.  Past Medical/Surgical History: Past Medical History:  Diagnosis Date  . Essential tremor   . Tremors of nervous system     Past Surgical History:  Procedure Laterality Date  .  COLONOSCOPY N/A 09/21/2014   Procedure: COLONOSCOPY;  Surgeon: Sandi L Fields, MD;  Location: [facility];  Service: Endoscopy;  Laterality: N/A;  1200 - moved to 10:30 - office to notify    Social History:  reports that he quit smoking about 41 years ago. He has a 22.00 pack-year smoking history. He has never used smokeless tobacco. He reports that he does not drink alcohol or use drugs.  Allergies: No Known Allergies  Family History:  Family History  Problem Relation Age of Onset  . Stroke Mother   . Stroke Father   . Arthritis Son   . Hyperlipidemia Son   . Hypertension Son   . Colon cancer Neg Hx     Prior to Admission medications   Medication Sig Start Date End Date Taking? Authorizing Provider  aspirin EC 81 MG tablet Take 81 mg by mouth daily.    [provider]  meclizine (ANTIVERT) 25 MG tablet Take 1 tablet (25 mg total) by mouth 3 (three) times daily as needed for dizziness. 03/15/17   Martin, Mary-Margaret, FNP  Multiple Vitamin (MULTIVITAMIN WITH MINERALS) TABS tablet Take 1 tablet by mouth daily.    [provider]  naproxen sodium (ALEVE) 220 MG tablet Take 2 tablets (440 mg total) by mouth daily. TAKE 2 tab BID for the flare up of the bursitis 05/03/18   Jones, Angel S, PA-C  predniSONE (STERAPRED UNI-PAK 21 TAB) 10 MG (21) TBPK tablet As directed x 6 days 01/18/19   Jones, Angel S, PA-C  propranolol (INDERAL) 40 MG tablet Take 1 tablet (40 mg total) by mouth 3 (three) times daily. 05/03/18   Jones, Angel S, PA-C    Review of Systems:  Negative except as otherwise mentioned in HPI.      Physical Exam: Vitals:   01/21/19 1222 01/21/19 1230 01/21/19 1236 01/21/19 1241  BP:  122/68    Pulse: (!) 57 (!) 57 63 (!) 58  Resp: (!) 24 (!) 24 (!) 21 (!) 25  Temp:      SpO2: (!) 87% 90% (!) 86% 97%  Weight:      Height:       Constitutional: Expressing general malaise, currently afebrile, denies chest pain.  Able to speak in full sentences.   Eyes:  PERRL, lids and conjunctivae normal ENMT: Mucous membranes are moist. Posterior pharynx clear of any exudate or lesions. Hard of hearing. Neck: normal, supple, no masses, no thyromegaly, no JVD. Respiratory: clear to auscultation bilaterally, no wheezing, no crackles. Normal respiratory effort. No accessory muscle use.  Cardiovascular: Sinus bradycardia, normal rhythm; no rubs, no gallops, no JVD.  No extremity edema. 2+ pedal pulses. No carotid bruits.  Abdomen: no tenderness, no masses palpated. No hepatosplenomegaly. Bowel sounds positive.  Musculoskeletal: no clubbing / cyanosis. No joint deformity upper and lower extremities. Good ROM, no contractures. Normal muscle tone.  Skin: no rashes, lesions, ulcers. No induration Neurologic: CN 2-12 grossly intact. Sensation intact, DTR normal. Strength 5/5 in all 4.  Psychiatric: Normal judgment and insight. Alert and oriented x 3. Normal mood.    Labs on Admission: I have personally reviewed the following labs and imaging studies  CBC: Recent Labs  Lab 01/21/19 1028  WBC 9.9  NEUTROABS 8.2*  HGB 14.3  HCT 42.6  MCV 91.4  PLT 162   Basic Metabolic Panel: Recent Labs  Lab 01/21/19 1028  NA 137  K 3.9  CL 105  CO2 23  GLUCOSE 116*  BUN 26*  CREATININE 1.09  CALCIUM 8.4*  MG 2.0   GFR: Estimated Creatinine Clearance: 71.2 mL/min (by C-G formula based on SCr of 1.09 mg/dL).   Liver Function Tests: Recent Labs  Lab 01/21/19 1028  AST 35  ALT 29  ALKPHOS 49  BILITOT 1.5*  PROT 6.8  ALBUMIN 3.6   Lipid Profile: Recent Labs    01/21/19 1028  TRIG 61   Anemia Panel: Recent Labs    01/21/19 1028  FERRITIN 246   Sepsis Labs:  Recent Results (from the past 240 hour(s))  Blood Culture (routine x 2)     Status: None (Preliminary result)   Collection Time: 01/21/19 10:57 AM   Specimen: Left Antecubital; Blood  Result Value Ref Range Status   Specimen Description   Final    LEFT ANTECUBITAL BOTTLES DRAWN AEROBIC  AND ANAEROBIC   Special Requests   Final    Blood Culture adequate volume Performed at [facility], [address] [place], [place] [address]    Culture PENDING  Incomplete   Report Status PENDING  Incomplete  Blood Culture (routine x 2)     Status: None (Preliminary result)   Collection Time: 01/21/19 11:11 AM   Specimen: Left Antecubital; Blood  Result Value Ref Range Status   Specimen Description BLOOD LEFT ARM BOTTLES DRAWN AEROBIC AND ANAEROBIC  Final   Special Requests   Final    Blood Culture adequate volume Performed at  Hospital, [address] [place], [place] [address]    Culture PENDING  Incomplete   Report Status PENDING  Incomplete     Radiological Exams on Admission: Dg Chest Port 1 View  Result Date: 01/21/2019 CLINICAL DATA:  73-year-old who tested COVID-19 positive one week ago and has had vomiting since that time. He presents   with generalized weakness. EXAM: PORTABLE CHEST 1 VIEW COMPARISON:  None. FINDINGS: Cardiac silhouette upper normal in size for AP portable technique. Scattered streaky and patchy airspace opacities are present in both lungs. No confluent airspace consolidation. No pleural effusions. IMPRESSION: Scattered streaky and patchy airspace opacities in both lungs, possibly indicating bronchopneumonia, though may just represent scarring (there are no prior chest x-rays for comparison). Electronically Signed   By: Thomas  Lawrence M.D.   On: 01/21/2019 12:05    EKG: Independently reviewed.  Sinus rhythm, mild right bundle branch block appreciated; normal QT, normal axis.  No acute ischemic changes otherwise.  Assessment/Plan 1-Acute respiratory disease due to COVID-19 virus -Patient with oxygen saturation of 8687% on room air; using 2 L nasal cannula supplementation -Currently afebrile and reporting chills intermittent nonproductive coughing spells. -Chest x-ray demonstrating bilateral infiltrate versus scaring. -Patient has been started on  steroids (Solu-Medrol 0.5 mg/kg every 12 hours) -Order for remdesivir initiation to be started once the patient arrived to [facility] campus. -Will follow follow-up daily inflammatory markers trend. -Vitamin C and zinc has been ordered. -Patient will be transferred to [facility] for further evaluation and management. -holding on antibiotic therapy currently.   2-Body mass index 31.0-31.9, adult -Class I obesity without serious comorbidities -Low calorie diet and portion control discussed with patient. -Body mass index is 31.38 kg/m.  3-HTN (hypertension) -Blood pressure overall stable -Will hold propanolol initially. -Follow vital signs.  4-nausea/vomiting and dehydration -Provide fluid resuscitation -Full liquid diet has been ordered -As needed antiemetics to be given. -Check orthostatics vital signs.  5-GI prophylaxis -started on Pepcid.  6-sinus bradycardia -holding propanolol initially -follow VS.   DVT prophylaxis: Lovenox Code Status: Full code Family Communication: No family at bedside. Disposition Plan: Hopefully discharge back home once medically stable; final outcome to be determined. Consults called: None Admission status: Inpatient, admitted to [facility] for further evaluation and management of coronavirus infection; MedSurg.  Length of stay more than 2 midnights.   Time Spent: 70 minutes.    MD [facility] Pager [phone]  01/21/2019, 1:03 PM    

## 2019-01-22 LAB — COMPREHENSIVE METABOLIC PANEL
ALT: 29 U/L (ref 0–44)
AST: 32 U/L (ref 15–41)
Albumin: 3 g/dL — ABNORMAL LOW (ref 3.5–5.0)
Alkaline Phosphatase: 50 U/L (ref 38–126)
Anion gap: 8 (ref 5–15)
BUN: 27 mg/dL — ABNORMAL HIGH (ref 8–23)
CO2: 24 mmol/L (ref 22–32)
Calcium: 8.4 mg/dL — ABNORMAL LOW (ref 8.9–10.3)
Chloride: 107 mmol/L (ref 98–111)
Creatinine, Ser: 1.02 mg/dL (ref 0.61–1.24)
GFR calc Af Amer: >60 mL/min (ref >60)
GFR calc non Af Amer: >60 mL/min (ref >60)
Glucose, Bld: 208 mg/dL — ABNORMAL HIGH (ref 70–99)
Potassium: 4.3 mmol/L (ref 3.5–5.1)
Sodium: 139 mmol/L (ref 135–145)
Total Bilirubin: 1 mg/dL (ref 0.3–1.2)
Total Protein: 6.2 g/dL — ABNORMAL LOW (ref 6.5–8.1)

## 2019-01-22 LAB — CBC WITH DIFFERENTIAL/PLATELET
Abs Immature Granulocytes: 0.04 10*3/uL (ref 0.00–0.07)
Basophils Absolute: 0 10*3/uL (ref 0.0–0.1)
Basophils Relative: 0 %
Eosinophils Absolute: 0 10*3/uL (ref 0.0–0.5)
Eosinophils Relative: 0 %
HCT: 39.2 % (ref 39.0–52.0)
Hemoglobin: 13.1 g/dL (ref 13.0–17.0)
Immature Granulocytes: 1 %
Lymphocytes Relative: 11 %
Lymphs Abs: 0.8 10*3/uL (ref 0.7–4.0)
MCH: 30.7 pg (ref 26.0–34.0)
MCHC: 33.4 g/dL (ref 30.0–36.0)
MCV: 91.8 fL (ref 80.0–100.0)
Monocytes Absolute: 0.2 10*3/uL (ref 0.1–1.0)
Monocytes Relative: 3 %
Neutro Abs: 6.4 10*3/uL (ref 1.7–7.7)
Neutrophils Relative %: 85 %
Platelets: 152 10*3/uL (ref 150–400)
RBC: 4.27 MIL/uL (ref 4.22–5.81)
RDW: 11.9 % (ref 11.5–15.5)
WBC: 7.4 10*3/uL (ref 4.0–10.5)
nRBC: 0 % (ref 0.0–0.2)

## 2019-01-22 LAB — C-REACTIVE PROTEIN: CRP: 11.1 mg/dL — ABNORMAL HIGH (ref <1.0)

## 2019-01-22 LAB — D-DIMER, QUANTITATIVE: D-Dimer, Quant: 1.39 ug/mL-FEU — ABNORMAL HIGH (ref 0.00–0.50)

## 2019-01-22 LAB — FERRITIN: Ferritin: 302 ng/mL (ref 24–336)

## 2019-01-22 LAB — MAGNESIUM: Magnesium: 2.1 mg/dL (ref 1.7–2.4)

## 2019-01-22 MED ORDER — METHYLPREDNISOLONE SODIUM SUCC 125 MG IJ SOLR: 60.0000 mg | Freq: Two times a day (BID) | INTRAMUSCULAR | Status: DC

## 2019-01-22 MED ORDER — METHYLPREDNISOLONE SODIUM SUCC 40 MG IJ SOLR
40.0000 mg | Freq: Two times a day (BID) | INTRAMUSCULAR | Status: DC
  Administered 2019-01-22 – 2019-01-23 (×2): 40 mg via INTRAVENOUS
  Filled 2019-01-22 (×2): qty 1

## 2019-01-22 MED ORDER — AMLODIPINE BESYLATE 10 MG PO TABS
10.0000 mg | ORAL_TABLET | Freq: Every day | ORAL | Status: DC
  Administered 2019-01-22 – 2019-01-26 (×5): 10 mg via ORAL
  Filled 2019-01-22 (×5): qty 1

## 2019-01-22 NOTE — Progress Notes (Signed)
PROGRESS NOTE                                                                                                                                                                                                             Patient Demographics:    Robert Faulkner, is a 73 y.o. male, DOB - Dec 12, 1945, [id number]  Outpatient Primary MD for the patient is Theodoro Clock    LOS - 1  Admit date - 01/21/2019    Chief Complaint  Patient presents with  . Emesis  . Covid positive       Brief Narrative  - Robert Faulkner is a 73 y.o. male with past medical history significant for essential hypertension and tremors; who presented to the emergency department complaining of general malaise, nausea/vomiting, intermittent episode of diarrhea and URI symptoms.  Patient symptoms started approximately over a week ago he was tested on 01/14/19 and turned out to be positive for coronavirus, he developed mild shortness of breath and came to the ER at any pain hospital from where he was sent to [facility] for further care.   Subjective:    Robert Faulkner today has, No headache, No chest pain, No abdominal pain - No Nausea, No new weakness tingling or numbness, mild Cough - SOB.     Assessment  & Plan :     1. Acute Hypoxic Resp. Failure due to Acute Covid 19 Viral Pneumonitis during the ongoing 2020 Covid 19 Pandemic - he seems to have moderate disease, started on IV steroids along with remdesivir with so far good results.  He is stable on 2 L nasal cannula oxygen.  Will be monitored closely.   Encouraged her to sit up in chair in the daytime use I-S and flutter valve for pulmonary toiletry and then prone in bed when at night.  Actemra off label use - patient was told that if COVID-19 pneumonitis gets worse we might potentially use Actemra off label, she denies any known history of tuberculosis or hepatitis, understands the risks and  benefits and wants to proceed with Actemra treatment if required.  Convalescent plasma  - Patient was also informed in detail about convalescent plasma use, she has consented for the same.  SpO2: 92 % O2 Flow Rate (L/min): 2 L/min  Hepatic Function Latest Ref Rng & Units 01/22/2019 01/21/2019 05/03/2018  Total Protein 6.5 - 8.1 g/dL 6.2(L) 6.8 6.8  Albumin 3.5 - 5.0 g/dL 3.0(L) 3.6 4.2  AST 15 - 41 U/L 32 35 24  ALT 0 - 44 U/L '29 29 25  ' Alk Phosphatase 38 - 126 U/L 50 49 74  Total Bilirubin 0.3 - 1.2 mg/dL 1.0 1.5(H) 0.7    COVID-19 Labs  Recent Labs    01/21/19 1028 01/22/19 0156  DDIMER 1.70* 1.39*  FERRITIN 246 302  LDH 245*  --   CRP 11.0* 11.1*    No results found for: SARSCOV2NAA   No results found for: BNP   2. HTN - blood pressure has stabilized will add low-dose Norvasc and monitor.  3.  COVID-19 gastroenteritis.  Resolved.  4.  GI prophylaxis.  Pepcid.     Condition - Extremely Guarded  Family Communication  :  Son Rodman Key on 11/29  Code Status :  Full  Diet :    Diet Order            Diet full liquid Room service appropriate? Yes; Fluid consistency: Thin  Diet effective now               Disposition Plan  :  Home  Consults  :  None  Procedures  :     PUD Prophylaxis :    DVT Prophylaxis  :  Lovenox   Lab Results  Component Value Date   PLT 152 01/22/2019    Inpatient Medications  Scheduled Meds: . enoxaparin (LOVENOX) injection  40 mg Subcutaneous Q24H  . methylPREDNISolone (SOLU-MEDROL) injection  40 mg Intravenous Q12H  . propranolol  40 mg Oral TID  . vitamin C  500 mg Oral Daily  . zinc sulfate  220 mg Oral Daily   Continuous Infusions: . famotidine (PEPCID) IV 20 mg (01/21/19 1506)  . remdesivir 100 mg in NS 250 mL 100 mg (01/22/19 0940)   PRN Meds:.acetaminophen, guaiFENesin-dextromethorphan, [DISCONTINUED] ondansetron **OR** ondansetron (ZOFRAN) IV  Antibiotics  :    Anti-infectives (From admission, onward)    Start     Dose/Rate Route Frequency Ordered Stop   01/22/19 1000  remdesivir 100 mg in sodium chloride 0.9 % 250 mL IVPB     100 mg 500 mL/hr over 30 Minutes Intravenous Every 24 hours 01/21/19 1600 01/26/19 0959   01/21/19 1700  remdesivir 200 mg in sodium chloride 0.9 % 250 mL IVPB     200 mg 500 mL/hr over 30 Minutes Intravenous Once 01/21/19 1600 01/21/19 1753       Time Spent in minutes  30   Lala Lund M.D on 01/22/2019 at 10:03 AM  To page go to [email or web address] - password [facility]  [facility] -  Office  [phone]  See all Orders from today for further details    Objective:   Vitals:   01/21/19 1608 01/21/19 1900 01/22/19 0300 01/22/19 0815  BP: 135/72 (!) 129/59 131/70 135/74  Pulse: (!) 57 (!) 59 (!) 58 (!) 54  Resp: (!) '21 20 20 18  ' Temp: 98.4 F (36.9 C) 97.8 F (36.6 C) 97.6 F (36.4 C) 97.8 F (36.6 C)  TempSrc: Oral Oral Oral Oral  SpO2: 94% 98% 92% 92%  Weight:      Height:        Wt Readings from Last 3 Encounters:  01/21/19 102.1 kg  05/03/18 102.4 kg  04/21/18 102.1 kg     Intake/Output Summary (Last 24 hours) at 01/22/2019 1003 Last data filed at 01/22/2019 0500  Gross per 24 hour  Intake 1346.22 ml  Output 800 ml  Net 546.22 ml     Physical Exam  Awake Alert,   No new F.N deficits, Normal affect [place].AT,PERRAL Supple Neck,No JVD, No cervical lymphadenopathy appriciated.  Symmetrical Chest wall movement, Good air movement bilaterally, CTAB RRR,No Gallops,Rubs or new Murmurs, No Parasternal Heave +ve B.Sounds, Abd Soft, No tenderness, No organomegaly appriciated, No rebound - guarding or rigidity. No Cyanosis, Clubbing or edema, No new Rash or bruise     Data Review:    CBC Recent Labs  Lab 01/21/19 1028 01/22/19 0156  WBC 9.9 7.4  HGB 14.3 13.1  HCT 42.6 39.2  PLT 162 152  MCV 91.4 91.8  MCH 30.7 30.7  MCHC 33.6 33.4  RDW 12.0 11.9  LYMPHSABS 1.0 0.8  MONOABS 0.6 0.2  EOSABS 0.0 0.0  BASOSABS 0.0 0.0     Chemistries  Recent Labs  Lab 01/21/19 1028 01/22/19 0156  NA 137 139  K 3.9 4.3  CL 105 107  CO2 23 24  GLUCOSE 116* 208*  BUN 26* 27*  CREATININE 1.09 1.02  CALCIUM 8.4* 8.4*  MG 2.0 2.1  AST 35 32  ALT 29 29  ALKPHOS 49 50  BILITOT 1.5* 1.0   ------------------------------------------------------------------------------------------------------------------ Recent Labs    01/21/19 1028  TRIG 61    No results found for: HGBA1C ------------------------------------------------------------------------------------------------------------------ No results for input(s): TSH, T4TOTAL, T3FREE, THYROIDAB in the last 72 hours.  Invalid input(s): FREET3  Cardiac Enzymes No results for input(s): CKMB, TROPONINI, MYOGLOBIN in the last 168 hours.  Invalid input(s): CK ------------------------------------------------------------------------------------------------------------------ No results found for: BNP  Micro Results Recent Results (from the past 240 hour(s))  Blood Culture (routine x 2)     Status: None (Preliminary result)   Collection Time: 01/21/19 10:57 AM   Specimen: Left Antecubital; Blood  Result Value Ref Range Status   Specimen Description   Final    LEFT ANTECUBITAL BOTTLES DRAWN AEROBIC AND ANAEROBIC   Special Requests   Final    Blood Culture adequate volume Performed at [facility], [address], [place], [place] [address]    Culture PENDING  Incomplete   Report Status PENDING  Incomplete  Blood Culture (routine x 2)     Status: None (Preliminary result)   Collection Time: 01/21/19 11:11 AM   Specimen: Left Antecubital; Blood  Result Value Ref Range Status   Specimen Description BLOOD LEFT ARM BOTTLES DRAWN AEROBIC AND ANAEROBIC  Final   Special Requests   Final    Blood Culture adequate volume Performed at [facility], [address] [place], [place] [address]    Culture PENDING  Incomplete   Report Status PENDING  Incomplete    Radiology  Reports Dg Chest Port 1 View  Result Date: 01/21/2019 CLINICAL DATA:  73 year old who tested COVID-19 positive one week ago and has had vomiting since that time. He presents with generalized weakness. EXAM: PORTABLE CHEST 1 VIEW COMPARISON:  None. FINDINGS: Cardiac silhouette upper normal in size for AP portable technique. Scattered streaky and patchy airspace opacities are present in both lungs. No confluent airspace consolidation. No pleural effusions. IMPRESSION: Scattered streaky and patchy airspace opacities in both lungs, possibly indicating bronchopneumonia, though may just represent scarring (there are no prior chest x-rays for comparison). Electronically Signed   By: Evangeline Dakin M.D.   On: 01/21/2019 12:05

## 2019-01-22 NOTE — Progress Notes (Signed)
Occupational Therapy Evaluation Patient Details Name: Robert Faulkner MRN: [id number] DOB: October 04, 1945 Today's Date: 01/22/2019    History of Present Illness Robert Faulkner is a 73 y.o. male with past medical history significant for essential hypertension and tremors; who presented to the emergency department complaining of general malaise, nausea/vomiting, intermittent episode of diarrhea and URI symptoms.  Patient symptoms started approximately over a week ago he was tested on 01/14/19 and turned out to be positive for coronavirus, he developed mild shortness of breath and came to the ER at any pain hospital from where he was sent to [facility] for further care.   Clinical Impression   PTA pt was living at home with his wife, independent in all ADLs, IADLs, and mobility. Pt reports ambulating without an assistive device and states 0 falls in the last 6 months. Pt is the primary caregiver for his wife who is also in the hospital with [phone]. Pt currently requires supervision to min guard for self-care and functional transfer tasks. Pt able to manage despite significant BUE tremors. Pt reports that he's had these tremors for over a decade and takes medication to control them. Discussed weighted utensils for self-feeding task to increase ease. Pt able to ambulate to/from bathroom and complete grooming and toileting tasks. Pt's O2 SATs dropped to 84% on 3L [place]. Noted quick return to 96% following min seated rest break. 0/4 DOE. Pt reports no anxiety related to SOB. Pt demonstrates decreased [place], [place], strength, endurance, balance, standing tolerance, and activity tolerance impacting ability to complete self-care and functional transfer tasks safely and independently. Recommend skilled OT services to address above deficits in order to promote function and prevent further decline. Recommend [place] OT services for continued rehab following hospital discharge.     Follow Up Recommendations  Home health OT     Equipment Recommendations  None recommended by OT    Recommendations for Other Services       Precautions / Restrictions Precautions Precautions: Fall Restrictions Weight Bearing Restrictions: No      Mobility Bed Mobility Overal bed mobility: Modified Independent                Transfers Overall transfer level: Needs assistance Equipment used: None Transfers: Sit to/from Stand Sit to Stand: Supervision;Min guard         General transfer comment: Pt able to ambulate to/from bathroom without an assistive device    Balance Overall balance assessment: Needs assistance   Sitting balance-Leahy Scale: Good       Standing balance-Leahy Scale: Fair                             ADL either performed or assessed with clinical judgement   ADL Overall ADL's : Needs assistance/impaired Eating/Feeding: Modified independent(Pt able to manage meal despite tremors)   Grooming: Supervision/safety;Standing   Upper Body Bathing: Supervision/ safety;Sitting   Lower Body Bathing: Supervison/ safety;Sit to/from stand;Sitting/lateral leans   Upper Body Dressing : Supervision/safety;Sitting   Lower Body Dressing: Supervision/safety;Sit to/from stand;Sitting/lateral leans   Toilet Transfer: Supervision/safety;Ambulation   Toileting- Clothing Manipulation and Hygiene: Supervision/safety;Sit to/from stand       Functional mobility during ADLs: Min guard;Supervision/safety       Vision Baseline Vision/History: Wears glasses Wears Glasses: At all times       Perception     Praxis      Pertinent Vitals/Pain Pain Assessment: No/denies pain     Hand Dominance Right(Bilateral hand tremors.  Pt takes medication for it.)   Extremity/Trunk Assessment Upper Extremity Assessment Upper Extremity Assessment: Overall WFL for tasks assessed   Lower Extremity Assessment Lower Extremity Assessment: Defer to PT evaluation       Communication  Communication Communication: HOH   Cognition Arousal/Alertness: Awake/alert Behavior During Therapy: WFL for tasks assessed/performed Overall Cognitive Status: Within Functional Limits for tasks assessed                                     General Comments  Educated pt on safety strategies and compensatory techniques for self-care and functional transfer tasks.     Exercises Exercises: Other exercises Other Exercises Other Exercises: Pursed lip breathing x 10 Other Exercises: Flutter valve x 10 with min cues on technique Other Exercises: Incentive spirometer x 10 with min cues on technique. Averaging 1516mL   Shoulder Instructions      Home Living Family/patient expects to be discharged to:: Private residence Living Arrangements: Spouse/significant other Available Help at Discharge: Family Type of Home: House Home Access: Level entry     Home Layout: Two level     Bathroom Shower/Tub: Teacher, early years/pre: Handicapped height Bathroom Accessibility: Yes   Home Equipment: Shower seat;Bedside commode;Hand held Tourist information centre manager - 4 wheels;Wheelchair - manual   Additional Comments: Pt's wife spends majority of her time in her w/c.       Prior Functioning/Environment Level of Independence: Independent        Comments: Pt independent with all ADLs, IADLs, and mobility. Pt does not ambulate with an AD. Pt still drives. Pt takes care of his wife. Pt's wife hasn't ambulated in over a year.        OT Problem List: Decreased strength;Decreased activity tolerance;Impaired balance (sitting and/or standing);Decreased safety awareness;Decreased knowledge of use of DME or AE;Cardiopulmonary status limiting activity      OT Treatment/Interventions: Self-care/ADL training;Therapeutic exercise;Neuromuscular education;Energy conservation;DME and/or AE instruction;Therapeutic activities;Patient/family education;Balance training    OT Goals(Current goals  can be found in the care plan section) Acute Rehab OT Goals Patient Stated Goal: To go home Time For Goal Achievement: 02/05/19 Potential to Achieve Goals: Good ADL Goals Pt Will Perform Grooming: standing;with modified independence Pt Will Perform Lower Body Bathing: with modified independence;sit to/from stand Pt Will Perform Lower Body Dressing: with modified independence;sit to/from stand Pt Will Transfer to Toilet: with modified independence;regular height toilet;ambulating Pt Will Perform Toileting - Clothing Manipulation and hygiene: with modified independence;sit to/from stand Additional ADL Goal #1: Pt will recall and demonstrate breathing exercises with 0 verbal cues on technique.  OT Frequency: Min 3X/week   Barriers to D/C:            Co-evaluation              AM-PAC OT "6 Clicks" Daily Activity     Outcome Measure Help from another person eating meals?: None Help from another person taking care of personal grooming?: A Little Help from another person toileting, which includes using toliet, bedpan, or urinal?: A Little Help from another person bathing (including washing, rinsing, drying)?: A Little Help from another person to put on and taking off regular upper body clothing?: A Little Help from another person to put on and taking off regular lower body clothing?: A Little 6 Click Score: 19   End of Session Equipment Utilized During Treatment: Oxygen Nurse Communication: Mobility status  Activity Tolerance: Patient tolerated  treatment well Patient left: in chair;with call bell/phone within reach  OT Visit Diagnosis: Unsteadiness on feet (R26.81);Muscle weakness (generalized) (M62.81)                Time: [id number] OT Time Calculation (min): 43 min Charges:  OT General Charges $OT Visit: 1 Visit OT Evaluation $OT Eval Moderate Complexity: 1 Mod OT Treatments $Self Care/Home Management : 8-22 mins $Therapeutic Exercise: 8-22 mins  Mauri Brooklyn  OTR/L [phone]   Mauri Brooklyn 01/22/2019, 10:33 AM

## 2019-01-23 LAB — D-DIMER, QUANTITATIVE: D-Dimer, Quant: 1.26 ug/mL-FEU — ABNORMAL HIGH (ref 0.00–0.50)

## 2019-01-23 LAB — CBC WITH DIFFERENTIAL/PLATELET
Abs Immature Granulocytes: 0.09 10*3/uL — ABNORMAL HIGH (ref 0.00–0.07)
Basophils Absolute: 0 10*3/uL (ref 0.0–0.1)
Basophils Relative: 0 %
Eosinophils Absolute: 0 10*3/uL (ref 0.0–0.5)
Eosinophils Relative: 0 %
HCT: 40.2 % (ref 39.0–52.0)
Hemoglobin: 13.4 g/dL (ref 13.0–17.0)
Immature Granulocytes: 1 %
Lymphocytes Relative: 8 %
Lymphs Abs: 1 10*3/uL (ref 0.7–4.0)
MCH: 30.6 pg (ref 26.0–34.0)
MCHC: 33.3 g/dL (ref 30.0–36.0)
MCV: 91.8 fL (ref 80.0–100.0)
Monocytes Absolute: 0.7 10*3/uL (ref 0.1–1.0)
Monocytes Relative: 5 %
Neutro Abs: 11.3 10*3/uL — ABNORMAL HIGH (ref 1.7–7.7)
Neutrophils Relative %: 86 %
Platelets: 188 10*3/uL (ref 150–400)
RBC: 4.38 MIL/uL (ref 4.22–5.81)
RDW: 12.1 % (ref 11.5–15.5)
WBC: 13.1 10*3/uL — ABNORMAL HIGH (ref 4.0–10.5)
nRBC: 0 % (ref 0.0–0.2)

## 2019-01-23 LAB — COMPREHENSIVE METABOLIC PANEL
ALT: 29 U/L (ref 0–44)
AST: 29 U/L (ref 15–41)
Albumin: 3 g/dL — ABNORMAL LOW (ref 3.5–5.0)
Alkaline Phosphatase: 59 U/L (ref 38–126)
Anion gap: 8 (ref 5–15)
BUN: 29 mg/dL — ABNORMAL HIGH (ref 8–23)
CO2: 26 mmol/L (ref 22–32)
Calcium: 8.8 mg/dL — ABNORMAL LOW (ref 8.9–10.3)
Chloride: 106 mmol/L (ref 98–111)
Creatinine, Ser: 0.74 mg/dL (ref 0.61–1.24)
GFR calc Af Amer: >60 mL/min (ref >60)
GFR calc non Af Amer: >60 mL/min (ref >60)
Glucose, Bld: 124 mg/dL — ABNORMAL HIGH (ref 70–99)
Potassium: 4.9 mmol/L (ref 3.5–5.1)
Sodium: 140 mmol/L (ref 135–145)
Total Bilirubin: 0.7 mg/dL (ref 0.3–1.2)
Total Protein: 6.3 g/dL — ABNORMAL LOW (ref 6.5–8.1)

## 2019-01-23 LAB — C-REACTIVE PROTEIN: CRP: 6 mg/dL — ABNORMAL HIGH (ref <1.0)

## 2019-01-23 LAB — MAGNESIUM: Magnesium: 2.1 mg/dL (ref 1.7–2.4)

## 2019-01-23 LAB — FERRITIN: Ferritin: 291 ng/mL (ref 24–336)

## 2019-01-23 MED ORDER — QUETIAPINE FUMARATE 25 MG PO TABS
50.0000 mg | ORAL_TABLET | Freq: Every day | ORAL | Status: DC
  Administered 2019-01-23: 08:00:00 50 mg via ORAL
  Filled 2019-01-23: qty 2

## 2019-01-23 MED ORDER — METHYLPREDNISOLONE SODIUM SUCC 40 MG IJ SOLR
40.0000 mg | Freq: Two times a day (BID) | INTRAMUSCULAR | Status: DC
  Administered 2019-01-23 – 2019-01-26 (×6): 40 mg via INTRAVENOUS
  Filled 2019-01-23 (×6): qty 1

## 2019-01-23 MED ORDER — QUETIAPINE FUMARATE 25 MG PO TABS
50.0000 mg | ORAL_TABLET | Freq: Two times a day (BID) | ORAL | Status: DC
  Administered 2019-01-23 – 2019-01-26 (×6): 50 mg via ORAL
  Filled 2019-01-23 (×6): qty 2

## 2019-01-23 MED ORDER — SODIUM POLYSTYRENE SULFONATE 15 GM/60ML PO SUSP
15.0000 g | Freq: Once | ORAL | Status: AC
  Administered 2019-01-23: 15 g via ORAL
  Filled 2019-01-23: qty 60

## 2019-01-23 NOTE — Progress Notes (Signed)
Mr. Coatney who is normally alert and oriented x 4 this admission and per wife decided to go visit his wife as he usually does during the day but appeared confused. Vitals stable with O2 at 2L via portable Oxygen. However, RN had to frequently reapply oxygen due to patient insisting he didn't need it and oxygen levels decreased to 70s. O2 sat on 2L in 90s when left on. Patient denies sob or dizziness but noted to also walk without assistance, without oxygen to his room. RN reapplied oxygen and cardiac monitor. Patient then put on a tshirt, overalls, socks, coat, and hat stating he was cold. RN had to remind patient multiple times that he needed to keep monitor leads, oxygen, and pleth on and to remain still as patient was very restless and ambulating all over the room pulling off things. Offered to adjust temperature and give blanket but patient refused. Bed alarm constantly going off. Patient finally settled down in bed fully dressed to "take a nap" per patient. Wife stated that "he is getting on my nerves. He doesn't usually act like this but at home he won't stay still either." Informed wife in room 125 and patient that he needed to stay in his room if he was not going to comply with keeping oxygen on and remaining safe. Bed alarm activated and audible, chair alarm in room, falls bracelet applied, and floor mats utilized. Will continue to monitor closely.

## 2019-01-23 NOTE — Progress Notes (Signed)
Robert Faulkner had eventful day today. Mr. Robert Faulkner removed all of his monitoring equipment and his IV. He continued to have increased confusion and MD was paged due to concern for Robert Faulkner safety. Robert Faulkner also refused Lovenox and IV placement. His son Rodman Key was then called in order to calm him down. Robert Faulkner currently in room eyes closed breathing normal with no s/s of distress noted. Will continue to monitor.

## 2019-01-23 NOTE — Progress Notes (Signed)
PROGRESS NOTE                                                                                                                                                                                                             Patient Demographics:    Robert Faulkner, is a 73 y.o. male, DOB - Dec 27, 1945, SUN:[id number]  Outpatient Primary MD for the patient is Theodoro Clock    LOS - 2  Admit date - 01/21/2019    Chief Complaint  Patient presents with  . Emesis  . Covid positive       Brief Narrative  - Robert Faulkner is a 73 y.o. male with past medical history significant for essential hypertension and tremors; who presented to the emergency department complaining of general malaise, nausea/vomiting, intermittent episode of diarrhea and URI symptoms.  Patient symptoms started approximately over a week ago he was tested on 01/14/19 and turned out to be positive for coronavirus, he developed mild shortness of breath and came to the ER at any pain hospital from where he was sent to [facility] for further care.   Subjective:   Patient in bed, appears comfortable, denies any headache, no fever, no chest pain or pressure, no shortness of breath , no abdominal pain. No focal weakness.   Assessment  & Plan :     1. Acute Hypoxic Resp. Failure due to Acute Covid 19 Viral Pneumonitis during the ongoing 2020 Covid 19 Pandemic - he seems to have moderate disease, started on IV steroids along with remdesivir with so far good results.  He is stable on 2 L nasal cannula oxygen.  Clinically improved with improving inflammatory markers as well, start tapering steroids and monitor.  Most likely will go home on home oxygen.  Encouraged her to sit up in chair in the daytime use I-S and flutter valve for pulmonary toiletry and then prone in bed when at night.   SpO2: 96 % O2 Flow Rate (L/min): 2 L/min  Hepatic Function Latest Ref Rng & Units  01/23/2019 01/22/2019 01/21/2019  Total Protein 6.5 - 8.1 g/dL 6.3(L) 6.2(L) 6.8  Albumin 3.5 - 5.0 g/dL 3.0(L) 3.0(L) 3.6  AST 15 - 41 U/L 29 32 35  ALT 0 - 44 U/L '29 29 29  ' Alk Phosphatase 38 - 126 U/L 59 50 49  Total Bilirubin  0.3 - 1.2 mg/dL 0.7 1.0 1.5(H)    COVID-19 Labs  Recent Labs    01/21/19 1028 01/22/19 0156 01/23/19 0108  DDIMER 1.70* 1.39* 1.26*  FERRITIN 246 302 291  LDH 245*  --   --   CRP 11.0* 11.1* 6.0*    No results found for: SARSCOV2NAA   No results found for: BNP   2. HTN -currently on Norvasc, beta-blocker was stopped due to resting bradycardia.  3.  COVID -19 gastroenteritis.  Resolved.  4.  GI prophylaxis.  Pepcid.     Condition - Extremely Guarded  Family Communication  :  Son Rodman Key on 11/29, son Darcella Cheshire on 01/23/2019  Code Status :  Full  Diet :    Diet Order            Diet full liquid Room service appropriate? Yes; Fluid consistency: Thin  Diet effective now               Disposition Plan  :  Home  Consults  :  None  Procedures  :     PUD Prophylaxis :    DVT Prophylaxis  :  Lovenox   Lab Results  Component Value Date   PLT 188 01/23/2019    Inpatient Medications  Scheduled Meds: . amLODipine  10 mg Oral Daily  . enoxaparin (LOVENOX) injection  40 mg Subcutaneous Q24H  . methylPREDNISolone (SOLU-MEDROL) injection  40 mg Intravenous Q12H  . QUEtiapine  50 mg Oral QHS  . vitamin C  500 mg Oral Daily  . zinc sulfate  220 mg Oral Daily   Continuous Infusions: . famotidine (PEPCID) IV 20 mg (01/22/19 1203)  . remdesivir 100 mg in NS 250 mL 100 mg (01/23/19 1028)   PRN Meds:.acetaminophen, guaiFENesin-dextromethorphan, [DISCONTINUED] ondansetron **OR** ondansetron (ZOFRAN) IV  Antibiotics  :    Anti-infectives (From admission, onward)   Start     Dose/Rate Route Frequency Ordered Stop   01/22/19 1000  remdesivir 100 mg in sodium chloride 0.9 % 250 mL IVPB     100 mg 500 mL/hr over 30 Minutes Intravenous  Every 24 hours 01/21/19 1600 01/26/19 0959   01/21/19 1700  remdesivir 200 mg in sodium chloride 0.9 % 250 mL IVPB     200 mg 500 mL/hr over 30 Minutes Intravenous Once 01/21/19 1600 01/21/19 1753       Time Spent in minutes  30   Lala Lund M.D on 01/23/2019 at 11:41 AM  To page go to [email or web address] - password [facility]  [facility] -  Office  [phone]  See all Orders from today for further details    Objective:   Vitals:   01/22/19 1617 01/22/19 2000 01/23/19 0442 01/23/19 0724  BP: 129/69 130/68 114/74 126/67  Pulse: 63 (!) 56 (!) 54 (!) 49  Resp: '18 18 20 15  ' Temp: 97.8 F (36.6 C) 98.3 F (36.8 C) 98.6 F (37 C) 97.7 F (36.5 C)  TempSrc: Oral Oral Oral Axillary  SpO2: 91% 92% (!) 89% 96%  Weight:      Height:        Wt Readings from Last 3 Encounters:  01/21/19 102.1 kg  05/03/18 102.4 kg  04/21/18 102.1 kg     Intake/Output Summary (Last 24 hours) at 01/23/2019 1141 Last data filed at 01/23/2019 0916 Gross per 24 hour  Intake 480 ml  Output 300 ml  Net 180 ml     Physical Exam  Awake Alert,  No new F.N deficits, Normal  affect [place].AT,PERRAL Supple Neck,No JVD, No cervical lymphadenopathy appriciated.  Symmetrical Chest wall movement, Good air movement bilaterally, CTAB RRR,No Gallops, Rubs or new Murmurs, No Parasternal Heave +ve B.Sounds, Abd Soft, No tenderness, No organomegaly appriciated, No rebound - guarding or rigidity. No Cyanosis, Clubbing or edema, No new Rash or bruise     Data Review:    CBC Recent Labs  Lab 01/21/19 1028 01/22/19 0156 01/23/19 0108  WBC 9.9 7.4 13.1*  HGB 14.3 13.1 13.4  HCT 42.6 39.2 40.2  PLT 162 152 188  MCV 91.4 91.8 91.8  MCH 30.7 30.7 30.6  MCHC 33.6 33.4 33.3  RDW 12.0 11.9 12.1  LYMPHSABS 1.0 0.8 1.0  MONOABS 0.6 0.2 0.7  EOSABS 0.0 0.0 0.0  BASOSABS 0.0 0.0 0.0    Chemistries  Recent Labs  Lab 01/21/19 1028 01/22/19 0156 01/23/19 0108  NA 137 139 140  K 3.9 4.3 4.9  CL  105 107 106  CO2 '23 24 26  ' GLUCOSE 116* 208* 124*  BUN 26* 27* 29*  CREATININE 1.09 1.02 0.74  CALCIUM 8.4* 8.4* 8.8*  MG 2.0 2.1 2.1  AST 35 32 29  ALT '29 29 29  ' ALKPHOS 49 50 59  BILITOT 1.5* 1.0 0.7   ------------------------------------------------------------------------------------------------------------------ Recent Labs    01/21/19 1028  TRIG 61    No results found for: HGBA1C ------------------------------------------------------------------------------------------------------------------ No results for input(s): TSH, T4TOTAL, T3FREE, THYROIDAB in the last 72 hours.  Invalid input(s): FREET3  Cardiac Enzymes No results for input(s): CKMB, TROPONINI, MYOGLOBIN in the last 168 hours.  Invalid input(s): CK ------------------------------------------------------------------------------------------------------------------ No results found for: BNP  Micro Results Recent Results (from the past 240 hour(s))  Blood Culture (routine x 2)     Status: None (Preliminary result)   Collection Time: 01/21/19 10:57 AM   Specimen: Left Antecubital; Blood  Result Value Ref Range Status   Specimen Description   Final    LEFT ANTECUBITAL BOTTLES DRAWN AEROBIC AND ANAEROBIC   Special Requests Blood Culture adequate volume  Final   Culture   Final    NO GROWTH 2 DAYS Performed at [facility], [address] [place], [place] [address]    Report Status PENDING  Incomplete  Blood Culture (routine x 2)     Status: None (Preliminary result)   Collection Time: 01/21/19 11:11 AM   Specimen: BLOOD LEFT ARM  Result Value Ref Range Status   Specimen Description BLOOD LEFT ARM BOTTLES DRAWN AEROBIC AND ANAEROBIC  Final   Special Requests Blood Culture adequate volume  Final   Culture   Final    NO GROWTH 2 DAYS Performed at [facility], [address], [place], [place] [address]    Report Status PENDING  Incomplete    Radiology Reports Dg Chest Port 1 View  Result Date:  01/21/2019 CLINICAL DATA:  73 year old who tested COVID-19 positive one week ago and has had vomiting since that time. He presents with generalized weakness. EXAM: PORTABLE CHEST 1 VIEW COMPARISON:  None. FINDINGS: Cardiac silhouette upper normal in size for AP portable technique. Scattered streaky and patchy airspace opacities are present in both lungs. No confluent airspace consolidation. No pleural effusions. IMPRESSION: Scattered streaky and patchy airspace opacities in both lungs, possibly indicating bronchopneumonia, though may just represent scarring (there are no prior chest x-rays for comparison). Electronically Signed   By: Evangeline Dakin M.D.   On: 01/21/2019 12:05

## 2019-01-23 NOTE — Evaluation (Signed)
Physical Therapy Evaluation Patient Details Name: Robert Faulkner MRN: [id number] DOB: 01-26-1946 Today's Date: 01/23/2019   History of Present Illness  Robert Faulkner is a 73 y.o. male with past medical history significant for essential hypertension and tremors; who presented to the emergency department complaining of general malaise, nausea/vomiting, intermittent episode of diarrhea and URI symptoms.  Patient symptoms started approximately over a week ago he was tested on 01/14/19 and turned out to be positive for coronavirus, he developed mild shortness of breath and came to the ER at any pain hospital from where he was sent to [facility] for further care.  Clinical Impression  The patient is pleasantly confused, fixated on finding his shoes. Aparantly, confused state overnight. The patient did ambulate on RA x 100' with SPO2 87%. (finger probe)> Replaced on 2 L [place] withSPO2 increased to > 90%. Patient currently will need supervision due to confusion at times. Patient's wife reports multiple family memebers are positive. Pt admitted with above diagnosis.  Pt currently with functional limitations due to the deficits listed below (see PT Problem List). Pt will benefit from skilled PT to increase their independence and safety with mobility to allow discharge to the venue listed below.       Follow Up Recommendations HH PT follow up;Supervision - Intermittent- safety.sign     Equipment Recommendations  None recommended by PT    Recommendations for Other Services       Precautions / Restrictions Precautions Precautions: Fall Precaution Comments: has been confused , don't leave in wife's room      Mobility  Bed Mobility Overal bed mobility: Modified Independent                Transfers Overall transfer level: Needs assistance Equipment used: None Transfers: Sit to/from Stand Sit to Stand: Independent            Ambulation/Gait Ambulation/Gait assistance: Supervision;Min  guard Gait Distance (Feet): 100 Feet(x 2) Assistive device: None Gait Pattern/deviations: Step-through pattern Gait velocity: decr   General Gait Details: able to stop, turn no LOB noted.  Stairs            Wheelchair Mobility    Modified Rankin (Stroke Patients Only)       Balance Overall balance assessment: No apparent balance deficits (not formally assessed)                                           Pertinent Vitals/Pain      Home Living Family/patient expects to be discharged to:: Private residence Living Arrangements: Spouse/significant other Available Help at Discharge: Family Type of Home: House Home Access: Level entry     Home Layout: Two level;Able to live on main level with bedroom/bathroom Home Equipment: Shower seat;Bedside commode;Hand held shower head;Walker - 4 wheels;Wheelchair - manual Additional Comments: Pt's wife spends majority of her time in her w/c. , unsure what family will be avaialble. wife in 125.    Prior Function Level of Independence: Independent         Comments: Pt independent with all ADLs, IADLs, and mobility. Pt does not ambulate with an AD. Pt still drives. Pt takes care of his wife. Pt's wife hasn't ambulated in over a year.     Hand Dominance   Dominant Hand: Right    Extremity/Trunk Assessment   Upper Extremity Assessment Upper Extremity Assessment: Overall WFL for tasks assessed  Lower Extremity Assessment Lower Extremity Assessment: Overall WFL for tasks assessed    Cervical / Trunk Assessment Cervical / Trunk Assessment: Normal  Communication   Communication: HOH  Cognition Arousal/Alertness: Awake/alert Behavior During Therapy: Impulsive Overall Cognitive Status: Within Functional Limits for tasks assessed                                 General Comments: requires redirectiopn, had a confused episode 11/29      General Comments      Exercises      Assessment/Plan    PT Assessment Patient needs continued PT services  PT Problem List Decreased strength;Decreased mobility;Decreased safety awareness;Decreased activity tolerance;Cardiopulmonary status limiting activity;Decreased knowledge of use of DME;Decreased knowledge of precautions       PT Treatment Interventions Gait training;Functional mobility training;Therapeutic activities;Cognitive remediation;Therapeutic exercise;Patient/family education    PT Goals (Current goals can be found in the Care Plan section)  Acute Rehab PT Goals Patient Stated Goal: To go home PT Goal Formulation: With patient Time For Goal Achievement: 02/06/19 Potential to Achieve Goals: Good    Frequency Min 3X/week   Barriers to discharge Decreased caregiver support wife in hospital, multiple family positve w/ covid    Co-evaluation               AM-PAC PT "6 Clicks" Mobility  Outcome Measure Help needed turning from your back to your side while in a flat bed without using bedrails?: None Help needed moving from lying on your back to sitting on the side of a flat bed without using bedrails?: None Help needed moving to and from a bed to a chair (including a wheelchair)?: None Help needed standing up from a chair using your arms (e.g., wheelchair or bedside chair)?: None Help needed to walk in hospital room?: A Little Help needed climbing 3-5 steps with a railing? : A Little 6 Click Score: 22    End of Session Equipment Utilized During Treatment: Oxygen Activity Tolerance: Patient tolerated treatment well Patient left: in bed;with call bell/phone within reach;with bed alarm set Nurse Communication: Mobility status PT Visit Diagnosis: Unsteadiness on feet (R26.81)    Time: [id number] PT Time Calculation (min) (ACUTE ONLY): 41 min   Charges:   PT Evaluation $PT Eval Low Complexity: 1 Low PT Treatments $Gait Training: 23-37 mins        [place] Office [phone]  Claretha Cooper 01/23/2019, 2:54 PM

## 2019-01-23 NOTE — Progress Notes (Signed)
Notified Dr. Vanita Ingles of patient's confusion, restlessness, and change in mentation. Called son who stated that for the past 2 weeks at home the patient has been confused which was new. Awaiting response from Dr. Vanita Ingles. Patient will not keep O2, pleth, or leads on and constantly getting out of bed to look for his shoes.no shoes found

## 2019-01-24 LAB — D-DIMER, QUANTITATIVE: D-Dimer, Quant: 1.53 ug/mL-FEU — ABNORMAL HIGH (ref 0.00–0.50)

## 2019-01-24 LAB — CBC WITH DIFFERENTIAL/PLATELET
Abs Immature Granulocytes: 0.05 10*3/uL (ref 0.00–0.07)
Basophils Absolute: 0 10*3/uL (ref 0.0–0.1)
Basophils Relative: 0 %
Eosinophils Absolute: 0 10*3/uL (ref 0.0–0.5)
Eosinophils Relative: 0 %
HCT: 39.3 % (ref 39.0–52.0)
Hemoglobin: 13.1 g/dL (ref 13.0–17.0)
Immature Granulocytes: 1 %
Lymphocytes Relative: 8 %
Lymphs Abs: 0.9 10*3/uL (ref 0.7–4.0)
MCH: 30.7 pg (ref 26.0–34.0)
MCHC: 33.3 g/dL (ref 30.0–36.0)
MCV: 92 fL (ref 80.0–100.0)
Monocytes Absolute: 0.7 10*3/uL (ref 0.1–1.0)
Monocytes Relative: 6 %
Neutro Abs: 9.5 10*3/uL — ABNORMAL HIGH (ref 1.7–7.7)
Neutrophils Relative %: 85 %
Platelets: 199 10*3/uL (ref 150–400)
RBC: 4.27 MIL/uL (ref 4.22–5.81)
RDW: 12.3 % (ref 11.5–15.5)
WBC: 11.1 10*3/uL — ABNORMAL HIGH (ref 4.0–10.5)
nRBC: 0 % (ref 0.0–0.2)

## 2019-01-24 LAB — C-REACTIVE PROTEIN: CRP: 2.3 mg/dL — ABNORMAL HIGH (ref <1.0)

## 2019-01-24 LAB — COMPREHENSIVE METABOLIC PANEL
ALT: 31 U/L (ref 0–44)
AST: 26 U/L (ref 15–41)
Albumin: 2.9 g/dL — ABNORMAL LOW (ref 3.5–5.0)
Alkaline Phosphatase: 55 U/L (ref 38–126)
Anion gap: 9 (ref 5–15)
BUN: 27 mg/dL — ABNORMAL HIGH (ref 8–23)
CO2: 28 mmol/L (ref 22–32)
Calcium: 8.7 mg/dL — ABNORMAL LOW (ref 8.9–10.3)
Chloride: 107 mmol/L (ref 98–111)
Creatinine, Ser: 0.84 mg/dL (ref 0.61–1.24)
GFR calc Af Amer: >60 mL/min (ref >60)
GFR calc non Af Amer: >60 mL/min (ref >60)
Glucose, Bld: 112 mg/dL — ABNORMAL HIGH (ref 70–99)
Potassium: 4.4 mmol/L (ref 3.5–5.1)
Sodium: 144 mmol/L (ref 135–145)
Total Bilirubin: 0.7 mg/dL (ref 0.3–1.2)
Total Protein: 6 g/dL — ABNORMAL LOW (ref 6.5–8.1)

## 2019-01-24 LAB — MAGNESIUM: Magnesium: 2.1 mg/dL (ref 1.7–2.4)

## 2019-01-24 MED ORDER — FAMOTIDINE 20 MG PO TABS
20.0000 mg | ORAL_TABLET | Freq: Every day | ORAL | Status: DC
  Administered 2019-01-24 – 2019-01-26 (×3): 20 mg via ORAL
  Filled 2019-01-24 (×3): qty 1

## 2019-01-24 NOTE — Progress Notes (Signed)
PROGRESS NOTE                                                                                                                                                                                                             Patient Demographics:    Robert Faulkner, is a 73 y.o. male, DOB - 1945/10/01, ZES:[id number]  Outpatient Primary MD for the patient is Theodoro Clock    LOS - 3  Admit date - 01/21/2019    Chief Complaint  Patient presents with  . Emesis  . Covid positive       Brief Narrative  - Robert Faulkner is a 73 y.o. male with past medical history significant for essential hypertension and tremors; who presented to the emergency department complaining of general malaise, nausea/vomiting, intermittent episode of diarrhea and URI symptoms.  Patient symptoms started approximately over a week ago he was tested on 01/14/19 and turned out to be positive for coronavirus, he developed mild shortness of breath and came to the ER at any pain hospital from where he was sent to [facility] for further care.   Subjective:   Patient in bed, minimally confused but pleasant, answers all questions and follow all commands.  Denies any headache chest or abdominal pain.  No shortness of breath.   Assessment  & Plan :     1. Acute Hypoxic Resp. Failure due to Acute Covid 19 Viral Pneumonitis during the ongoing 2020 Covid 19 Pandemic - he seems to have moderate disease, started on IV steroids along with remdesivir with so far good results.  He is stable on 2 L nasal cannula oxygen.  Clinically improved with improving inflammatory markers as well, start tapering steroids and monitor.  Clear at rest on room air, upon ambulation and activity he is on 1-2 L nasal cannula oxygen.  Encouraged her to sit up in chair in the daytime use I-S and flutter valve for pulmonary toiletry and then prone in bed when at night.   SpO2: 95 % O2 Flow Rate  (L/min): 2 L/min  Hepatic Function Latest Ref Rng & Units 01/24/2019 01/23/2019 01/22/2019  Total Protein 6.5 - 8.1 g/dL 6.0(L) 6.3(L) 6.2(L)  Albumin 3.5 - 5.0 g/dL 2.9(L) 3.0(L) 3.0(L)  AST 15 - 41 U/L 26 29 32  ALT 0 - 44 U/L _0 Alk  Phosphatase 38 - 126 U/L 55 59 50  Total Bilirubin 0.3 - 1.2 mg/dL 0.7 0.7 1.0    COVID-19 Labs  Recent Labs    01/22/19 0156 01/23/19 0108 01/24/19 0139  DDIMER 1.39* 1.26* 1.53*  FERRITIN 302 291  --   CRP 11.1* 6.0* 2.3*    No results found for: SARSCOV2NAA   No results found for: BNP   2. HTN -currently on Norvasc, beta-blocker was stopped due to resting bradycardia.  3.  COVID -19 gastroenteritis.  Resolved.  4.  GI prophylaxis.  Pepcid.  5.  Episodes of resting bradycardia when he is sleeping at night.  Likely due to undiagnosed obstructive sleep apnea.  Blood pressure stable.  Beta-blocker has been discontinued.  Will check TSH.  Or else no further work-up.  Good heart rate and blood pressure in the morning when he is awake and active.    Condition - Extremely Guarded  Family Communication  :  Son Rodman Key on 11/29, son Darcella Cheshire on 01/23/2019  Code Status :  Full  Diet :    Diet Order            Diet full liquid Room service appropriate? Yes; Fluid consistency: Thin  Diet effective now               Disposition Plan  :  Home once he finishes his remdesivir.  Consults  :  None  Procedures  :     PUD Prophylaxis :    DVT Prophylaxis  :  Lovenox   Lab Results  Component Value Date   PLT 199 01/24/2019    Inpatient Medications  Scheduled Meds: . amLODipine  10 mg Oral Daily  . enoxaparin (LOVENOX) injection  40 mg Subcutaneous Q24H  . famotidine  20 mg Oral Daily  . methylPREDNISolone (SOLU-MEDROL) injection  40 mg Intravenous Q12H  . QUEtiapine  50 mg Oral BID  . vitamin C  500 mg Oral Daily  . zinc sulfate  220 mg Oral Daily   Continuous Infusions: . remdesivir 100 mg in NS 250 mL 100 mg  (01/24/19 1044)   PRN Meds:.acetaminophen, guaiFENesin-dextromethorphan, [DISCONTINUED] ondansetron **OR** ondansetron (ZOFRAN) IV  Antibiotics  :    Anti-infectives (From admission, onward)   Start     Dose/Rate Route Frequency Ordered Stop   01/22/19 1000  remdesivir 100 mg in sodium chloride 0.9 % 250 mL IVPB     100 mg 500 mL/hr over 30 Minutes Intravenous Every 24 hours 01/21/19 1600 01/26/19 0959   01/21/19 1700  remdesivir 200 mg in sodium chloride 0.9 % 250 mL IVPB     200 mg 500 mL/hr over 30 Minutes Intravenous Once 01/21/19 1600 01/21/19 1753       Time Spent in minutes  30   Lala Lund M.D on 01/24/2019 at 11:45 AM  To page go to [email or web address] - password [facility]  [facility] -  Office  [phone]  See all Orders from today for further details    Objective:   Vitals:   01/24/19 0417 01/24/19 0437 01/24/19 0757 01/24/19 0800  BP: 119/70   121/66  Pulse: (!) 52  (!) 56 (!) 56  Resp: 19  18   Temp:  97.6 F (36.4 C) 98.2 F (36.8 C) 98.2 F (36.8 C)  TempSrc:  Axillary Oral Oral  SpO2: 94%   95%  Weight:      Height:        Wt Readings from Last 3 Encounters:  01/21/19  102.1 kg  05/03/18 102.4 kg  04/21/18 102.1 kg     Intake/Output Summary (Last 24 hours) at 01/24/2019 1145 Last data filed at 01/24/2019 1142 Gross per 24 hour  Intake 600 ml  Output 0 ml  Net 600 ml     Physical Exam  Awake, minimally confused, No new F.N deficits,   [place].AT,PERRAL Supple Neck,No JVD, No cervical lymphadenopathy appriciated.  Symmetrical Chest wall movement, Good air movement bilaterally, CTAB RRR,No Gallops, Rubs or new Murmurs, No Parasternal Heave +ve B.Sounds, Abd Soft, No tenderness, No organomegaly appriciated, No rebound - guarding or rigidity. No Cyanosis, Clubbing or edema, No new Rash or bruise    Data Review:    CBC Recent Labs  Lab 01/21/19 1028 01/22/19 0156 01/23/19 0108 01/24/19 0139  WBC 9.9 7.4 13.1* 11.1*  HGB 14.3 13.1  13.4 13.1  HCT 42.6 39.2 40.2 39.3  PLT 162 152 188 199  MCV 91.4 91.8 91.8 92.0  MCH 30.7 30.7 30.6 30.7  MCHC 33.6 33.4 33.3 33.3  RDW 12.0 11.9 12.1 12.3  LYMPHSABS 1.0 0.8 1.0 0.9  MONOABS 0.6 0.2 0.7 0.7  EOSABS 0.0 0.0 0.0 0.0  BASOSABS 0.0 0.0 0.0 0.0    Chemistries  Recent Labs  Lab 01/21/19 1028 01/22/19 0156 01/23/19 0108 01/24/19 0139  NA 137 139 140 144  K 3.9 4.3 4.9 4.4  CL 105 107 106 107  CO2 _0 GLUCOSE 116* 208* 124* 112*  BUN 26* 27* 29* 27*  CREATININE 1.09 1.02 0.74 0.84  CALCIUM 8.4* 8.4* 8.8* 8.7*  MG 2.0 2.1 2.1 2.1  AST 35 32 29 26  ALT _1 ALKPHOS 49 50 59 55  BILITOT 1.5* 1.0 0.7 0.7   ------------------------------------------------------------------------------------------------------------------ No results for input(s): CHOL, HDL, LDLCALC, TRIG, CHOLHDL, LDLDIRECT in the last 72 hours.  No results found for: HGBA1C ------------------------------------------------------------------------------------------------------------------ No results for input(s): TSH, T4TOTAL, T3FREE, THYROIDAB in the last 72 hours.  Invalid input(s): FREET3  Cardiac Enzymes No results for input(s): CKMB, TROPONINI, MYOGLOBIN in the last 168 hours.  Invalid input(s): CK ------------------------------------------------------------------------------------------------------------------ No results found for: BNP  Micro Results Recent Results (from the past 240 hour(s))  Blood Culture (routine x 2)     Status: None (Preliminary result)   Collection Time: 01/21/19 10:57 AM   Specimen: Left Antecubital; Blood  Result Value Ref Range Status   Specimen Description   Final    LEFT ANTECUBITAL BOTTLES DRAWN AEROBIC AND ANAEROBIC   Special Requests Blood Culture adequate volume  Final   Culture   Final    NO GROWTH 3 DAYS Performed at [facility], [address] [place], [place] [address]    Report Status PENDING  Incomplete  Blood Culture  (routine x 2)     Status: None (Preliminary result)   Collection Time: 01/21/19 11:11 AM   Specimen: BLOOD LEFT ARM  Result Value Ref Range Status   Specimen Description BLOOD LEFT ARM BOTTLES DRAWN AEROBIC AND ANAEROBIC  Final   Special Requests Blood Culture adequate volume  Final   Culture   Final    NO GROWTH 3 DAYS Performed at [facility], [address] [place] [address], [place], [place] [address]    Report Status PENDING  Incomplete    Radiology Reports Dg Chest Port 1 View  Result Date: 01/21/2019 CLINICAL DATA:  73 year old who tested COVID-19 positive one week ago and has had vomiting since that time. He presents with generalized weakness. EXAM: PORTABLE CHEST  1 VIEW COMPARISON:  None. FINDINGS: Cardiac silhouette upper normal in size for AP portable technique. Scattered streaky and patchy airspace opacities are present in both lungs. No confluent airspace consolidation. No pleural effusions. IMPRESSION: Scattered streaky and patchy airspace opacities in both lungs, possibly indicating bronchopneumonia, though may just represent scarring (there are no prior chest x-rays for comparison). Electronically Signed   By: Evangeline Dakin M.D.   On: 01/21/2019 12:05

## 2019-01-24 NOTE — Progress Notes (Signed)
Patient is calm and resting.  HR decreased to the 40s.  Dr. Ellin Goodie notified and gave verbal instructions to hold tonight's seroquel dose. Sitter at bedside and will contiue to monitor for safety.

## 2019-01-25 LAB — COMPREHENSIVE METABOLIC PANEL
ALT: 50 U/L — ABNORMAL HIGH (ref 0–44)
AST: 33 U/L (ref 15–41)
Albumin: 3 g/dL — ABNORMAL LOW (ref 3.5–5.0)
Alkaline Phosphatase: 61 U/L (ref 38–126)
Anion gap: 10 (ref 5–15)
BUN: 23 mg/dL (ref 8–23)
CO2: 27 mmol/L (ref 22–32)
Calcium: 8.9 mg/dL (ref 8.9–10.3)
Chloride: 103 mmol/L (ref 98–111)
Creatinine, Ser: 0.83 mg/dL (ref 0.61–1.24)
GFR calc Af Amer: >60 mL/min (ref >60)
GFR calc non Af Amer: >60 mL/min (ref >60)
Glucose, Bld: 172 mg/dL — ABNORMAL HIGH (ref 70–99)
Potassium: 4.6 mmol/L (ref 3.5–5.1)
Sodium: 140 mmol/L (ref 135–145)
Total Bilirubin: 0.8 mg/dL (ref 0.3–1.2)
Total Protein: 6.2 g/dL — ABNORMAL LOW (ref 6.5–8.1)

## 2019-01-25 LAB — CBC WITH DIFFERENTIAL/PLATELET
Abs Immature Granulocytes: 0.12 10*3/uL — ABNORMAL HIGH (ref 0.00–0.07)
Basophils Absolute: 0 10*3/uL (ref 0.0–0.1)
Basophils Relative: 0 %
Eosinophils Absolute: 0 10*3/uL (ref 0.0–0.5)
Eosinophils Relative: 0 %
HCT: 41.2 % (ref 39.0–52.0)
Hemoglobin: 13.8 g/dL (ref 13.0–17.0)
Immature Granulocytes: 2 %
Lymphocytes Relative: 9 %
Lymphs Abs: 0.7 10*3/uL (ref 0.7–4.0)
MCH: 30.5 pg (ref 26.0–34.0)
MCHC: 33.5 g/dL (ref 30.0–36.0)
MCV: 91.2 fL (ref 80.0–100.0)
Monocytes Absolute: 0.3 10*3/uL (ref 0.1–1.0)
Monocytes Relative: 4 %
Neutro Abs: 6.7 10*3/uL (ref 1.7–7.7)
Neutrophils Relative %: 85 %
Platelets: 194 10*3/uL (ref 150–400)
RBC: 4.52 MIL/uL (ref 4.22–5.81)
RDW: 11.9 % (ref 11.5–15.5)
WBC: 7.9 10*3/uL (ref 4.0–10.5)
nRBC: 0 % (ref 0.0–0.2)

## 2019-01-25 LAB — MAGNESIUM: Magnesium: 2 mg/dL (ref 1.7–2.4)

## 2019-01-25 LAB — D-DIMER, QUANTITATIVE: D-Dimer, Quant: 1.64 ug/mL-FEU — ABNORMAL HIGH (ref 0.00–0.50)

## 2019-01-25 LAB — C-REACTIVE PROTEIN: CRP: 1.5 mg/dL — ABNORMAL HIGH (ref <1.0)

## 2019-01-25 LAB — TSH: TSH: 0.277 u[IU]/mL — ABNORMAL LOW (ref 0.350–4.500)

## 2019-01-25 NOTE — Progress Notes (Signed)
PROGRESS NOTE                                                                                                                                                                                                             Patient Demographics:    Robert Faulkner, is a 73 y.o. male, DOB - 02-Jul-1945, KKD:[id number]  Outpatient Primary MD for the patient is Theodoro Clock    LOS - 4  Admit date - 01/21/2019    Chief Complaint  Patient presents with  . Emesis  . Covid positive       Brief Narrative  - Robert Faulkner is a 73 y.o. male with past medical history significant for essential hypertension and tremors; who presented to the emergency department complaining of general malaise, nausea/vomiting, intermittent episode of diarrhea and URI symptoms.  Patient symptoms started approximately over a week ago he was tested on 01/14/19 and turned out to be positive for coronavirus, he developed mild shortness of breath and came to the ER at any pain hospital from where he was sent to [facility] for further care.   Subjective:   Patient in bed, minimally confused but pleasant, answers all questions and follow all commands.  Denies any headache chest or abdominal pain.  No shortness of breath.   Assessment  & Plan :     Acute Hypoxic Resp. Failure due to Acute Covid 19 Viral Pneumonitis during the ongoing 2020 Covid 19 Pandemic - he seems to have moderate disease, started on IV steroids along with remdesivir with so far good results.  This morning he was saturating 90% on room air , and apparently he did well with ambulation with PT today , was encouraged to to keep using incentive spirometry and flutter valve .  SpO2: 97 % O2 Flow Rate (L/min): (S) 2 L/min  Hepatic Function Latest Ref Rng & Units 01/25/2019 01/24/2019 01/23/2019  Total Protein 6.5 - 8.1 g/dL 6.2(L) 6.0(L) 6.3(L)  Albumin 3.5 - 5.0 g/dL 3.0(L) 2.9(L) 3.0(L)  AST 15  - 41 U/L 33 26 29  ALT 0 - 44 U/L 50(H) 31 29  Alk Phosphatase 38 - 126 U/L 61 55 59  Total Bilirubin 0.3 - 1.2 mg/dL 0.8 0.7 0.7    COVID-19 Labs  Recent Labs    01/23/19 0108 01/24/19 0139 01/25/19 0220  DDIMER  1.26* 1.53* 1.64*  FERRITIN 291  --   --   CRP 6.0* 2.3* 1.5*    No results found for: SARSCOV2NAA   No results found for: BNP   HTN -currently on Norvasc, beta-blocker was stopped due to resting bradycardia.  COVID -19 gastroenteritis.  Resolved.  GI prophylaxis.  Pepcid.  Episodes of resting bradycardia when he is sleeping at night.  Likely due to undiagnosed obstructive sleep apnea.  Blood pressure stable.  Beta-blocker has been discontinued.  TSH on the lower side 0.27, this is most likely sick euthyroid syndrome, hyperthyroidism wont be causing bradycardia, TSH will need to be repeated as an outpatient.    Family Communication  :  D/W patient .  As well discussed with his wife who is a patient here with his permission  Code Status :  Full  Diet :    Diet Order            Diet Heart Room service appropriate? Yes; Fluid consistency: Thin  Diet effective now               Disposition Plan  :  Home in 1 to 2 days  Consults  :  None  DVT Prophylaxis  :  Lovenox   Lab Results  Component Value Date   PLT 194 01/25/2019    Inpatient Medications  Scheduled Meds: . amLODipine  10 mg Oral Daily  . enoxaparin (LOVENOX) injection  40 mg Subcutaneous Q24H  . famotidine  20 mg Oral Daily  . methylPREDNISolone (SOLU-MEDROL) injection  40 mg Intravenous Q12H  . QUEtiapine  50 mg Oral BID  . vitamin C  500 mg Oral Daily  . zinc sulfate  220 mg Oral Daily   Continuous Infusions:  PRN Meds:.acetaminophen, guaiFENesin-dextromethorphan, [DISCONTINUED] ondansetron **OR** ondansetron (ZOFRAN) IV  Antibiotics  :    Anti-infectives (From admission, onward)   Start     Dose/Rate Route Frequency Ordered Stop   01/22/19 1000  remdesivir 100 mg in sodium  chloride 0.9 % 250 mL IVPB     100 mg 500 mL/hr over 30 Minutes Intravenous Every 24 hours 01/21/19 1600 01/25/19 1048   01/21/19 1700  remdesivir 200 mg in sodium chloride 0.9 % 250 mL IVPB     200 mg 500 mL/hr over 30 Minutes Intravenous Once 01/21/19 1600 01/21/19 1753       Baeleigh Devincent M.D on 01/25/2019 at 3:41 PM  To page go to [email or web address] - password [facility]  [facility] -  Office  [phone]  See all Orders from today for further details    Objective:   Vitals:   01/25/19 0512 01/25/19 0517 01/25/19 0525 01/25/19 0724  BP:    (!) 141/79  Pulse: (!) 59 62 62 65  Resp:    18  Temp:    (!) 97.4 F (36.3 C)  TempSrc:    Oral  SpO2: 94% 92% 94% 97%  Weight:      Height:        Wt Readings from Last 3 Encounters:  01/21/19 102.1 kg  05/03/18 102.4 kg  04/21/18 102.1 kg     Intake/Output Summary (Last 24 hours) at 01/25/2019 1541 Last data filed at 01/25/2019 0500 Gross per 24 hour  Intake -  Output 1325 ml  Net -1325 ml     Physical Exam  Awake Alert, Oriented X 3, No new F.N deficits, Normal affect Symmetrical Chest wall movement, Good air movement bilaterally, CTAB RRR,No Gallops,Rubs or new Murmurs, No  Parasternal Heave +ve B.Sounds, Abd Soft, No tenderness, No rebound - guarding or rigidity. No Cyanosis, Clubbing or edema, No new Rash or bruise      Data Review:    CBC Recent Labs  Lab 01/21/19 1028 01/22/19 0156 01/23/19 0108 01/24/19 0139 01/25/19 0220  WBC 9.9 7.4 13.1* 11.1* 7.9  HGB 14.3 13.1 13.4 13.1 13.8  HCT 42.6 39.2 40.2 39.3 41.2  PLT 162 152 188 199 194  MCV 91.4 91.8 91.8 92.0 91.2  MCH 30.7 30.7 30.6 30.7 30.5  MCHC 33.6 33.4 33.3 33.3 33.5  RDW 12.0 11.9 12.1 12.3 11.9  LYMPHSABS 1.0 0.8 1.0 0.9 0.7  MONOABS 0.6 0.2 0.7 0.7 0.3  EOSABS 0.0 0.0 0.0 0.0 0.0  BASOSABS 0.0 0.0 0.0 0.0 0.0    Chemistries  Recent Labs  Lab 01/21/19 1028 01/22/19 0156 01/23/19 0108 01/24/19 0139 01/25/19 0220  NA 137  139 140 144 140  K 3.9 4.3 4.9 4.4 4.6  CL 105 107 106 107 103  CO2 '23 24 26 28 27  ' GLUCOSE 116* 208* 124* 112* 172*  BUN 26* 27* 29* 27* 23  CREATININE 1.09 1.02 0.74 0.84 0.83  CALCIUM 8.4* 8.4* 8.8* 8.7* 8.9  MG 2.0 2.1 2.1 2.1 2.0  AST 35 32 29 26 33  ALT '29 29 29 31 ' 50*  ALKPHOS 49 50 59 55 61  BILITOT 1.5* 1.0 0.7 0.7 0.8   ------------------------------------------------------------------------------------------------------------------ No results for input(s): CHOL, HDL, LDLCALC, TRIG, CHOLHDL, LDLDIRECT in the last 72 hours.  No results found for: HGBA1C ------------------------------------------------------------------------------------------------------------------ Recent Labs    01/25/19 0220  TSH 0.277*    Cardiac Enzymes No results for input(s): CKMB, TROPONINI, MYOGLOBIN in the last 168 hours.  Invalid input(s): CK ------------------------------------------------------------------------------------------------------------------ No results found for: BNP  Micro Results Recent Results (from the past 240 hour(s))  Blood Culture (routine x 2)     Status: None (Preliminary result)   Collection Time: 01/21/19 10:57 AM   Specimen: Left Antecubital; Blood  Result Value Ref Range Status   Specimen Description   Final    LEFT ANTECUBITAL BOTTLES DRAWN AEROBIC AND ANAEROBIC   Special Requests Blood Culture adequate volume  Final   Culture   Final    NO GROWTH 4 DAYS Performed at [facility], [address], [place], [place] [address]    Report Status PENDING  Incomplete  Blood Culture (routine x 2)     Status: None (Preliminary result)   Collection Time: 01/21/19 11:11 AM   Specimen: BLOOD LEFT ARM  Result Value Ref Range Status   Specimen Description BLOOD LEFT ARM BOTTLES DRAWN AEROBIC AND ANAEROBIC  Final   Special Requests Blood Culture adequate volume  Final   Culture   Final    NO GROWTH 4 DAYS Performed at [facility], [address], [place],  [place] [address]    Report Status PENDING  Incomplete    Radiology Reports Dg Chest Port 1 View  Result Date: 01/21/2019 CLINICAL DATA:  73 year old who tested COVID-19 positive one week ago and has had vomiting since that time. He presents with generalized weakness. EXAM: PORTABLE CHEST 1 VIEW COMPARISON:  None. FINDINGS: Cardiac silhouette upper normal in size for AP portable technique. Scattered streaky and patchy airspace opacities are present in both lungs. No confluent airspace consolidation. No pleural effusions. IMPRESSION: Scattered streaky and patchy airspace opacities in both lungs, possibly indicating bronchopneumonia, though may just represent scarring (there are no prior chest x-rays for comparison). Electronically Signed  By: Evangeline Dakin M.D.   On: 01/21/2019 12:05

## 2019-01-25 NOTE — Progress Notes (Signed)
Occupational Therapy Treatment Patient Details Name: Robert Faulkner MRN: [id number] DOB: 17-May-1945 Today's Date: 01/25/2019    History of present illness Robert Faulkner is a 73 y.o. male with past medical history significant for essential hypertension and tremors; who presented to the emergency department complaining of general malaise, nausea/vomiting, intermittent episode of diarrhea and URI symptoms.  Patient symptoms started approximately over a week ago he was tested on 01/14/19 and turned out to be positive for coronavirus, he developed mild shortness of breath and came to the ER at any pain hospital from where he was sent to [facility] for further care.   OT comments  Pt progressing in therapy, noting improved activity tolerance with the pt requiring less oxygen. Pt completed all therapy tasks on room air with O2 SATs remaining in the 90s. Pt tolerated standing 2 x 5 min to complete sponge bathing and grooming/hygiene tasks at the sink. 2/4 DOE. Continued education with pt on safety strategies, activity modifications, and energy conservation techniques for self-care and functional transfer tasks noting fair understanding. Pt demo good recall of previously learned breathing exercises. Pt in wife's room at start and end of session.    Follow Up Recommendations  Home health OT    Equipment Recommendations  None recommended by OT    Recommendations for Other Services      Precautions / Restrictions Precautions Precautions: Fall Precaution Comments: has been confused , Restrictions Weight Bearing Restrictions: No       Mobility Bed Mobility               General bed mobility comments: OOB  Transfers Overall transfer level: Needs assistance Equipment used: None Transfers: Sit to/from Stand Sit to Stand: Supervision;Min guard         General transfer comment: Pt able to ambulate to/from bathroom and to/from wife's room with variable supervision to min guard. Noted 0  instances of LOB, however pt unsteady on his feet.     Balance Overall balance assessment: Needs assistance   Sitting balance-Leahy Scale: Good       Standing balance-Leahy Scale: Fair                             ADL either performed or assessed with clinical judgement   ADL Overall ADL's : Needs assistance/impaired     Grooming: Supervision/safety;Standing Grooming Details (indicate cue type and reason): Pt able to wash face, brush hair, and brush teeth while standing at the sink. Upper Body Bathing: Supervision/ safety;Standing Upper Body Bathing Details (indicate cue type and reason): While standing at the sink to complete sponge bathing task Lower Body Bathing: Min guard Lower Body Bathing Details (indicate cue type and reason): While standing at the sink to complete task. Upper Body Dressing : Supervision/safety;Sitting Upper Body Dressing Details (indicate cue type and reason): Able to don/doff overhead shirt while seated. Lower Body Dressing: Min guard;Supervision/safety;Sit to/from stand Lower Body Dressing Details (indicate cue type and reason): Pt able to don/doff overalls and underwear. Pt required mod cues for safety as he attempted to doff in standing.             Functional mobility during ADLs: Min guard General ADL Comments: Pt able to ambulate to/from the bathroom without an assistive device. Noted 0 instances of LOB, however pt unsteady on his feet.      Vision       Perception     Praxis  Cognition Arousal/Alertness: Awake/alert Behavior During Therapy: Impulsive Overall Cognitive Status: Within Functional Limits for tasks assessed                                 General Comments: Pt able to follow instructions for self-care tasks        Exercises Exercises: Other exercises Other Exercises Other Exercises: Pursed lip breathing x 10 with min cues on technique. Pulls 176mL Other Exercises: Flutter valve x 10    Shoulder Instructions       General Comments Pt completed self-care, functional transfers, and mobility tasks on room air with O2 SATs maintaining in the 90s throughout.     Pertinent Vitals/ Pain       Pain Assessment: No/denies pain  Home Living                                          Prior Functioning/Environment              Frequency           Progress Toward Goals  OT Goals(current goals can now be found in the care plan section)  Progress towards OT goals: Progressing toward goals  ADL Goals Pt Will Perform Grooming: standing;with modified independence Pt Will Perform Lower Body Bathing: with modified independence;sit to/from stand Pt Will Perform Lower Body Dressing: with modified independence;sit to/from stand Pt Will Transfer to Toilet: with modified independence;regular height toilet;ambulating Pt Will Perform Toileting - Clothing Manipulation and hygiene: with modified independence;sit to/from stand Additional ADL Goal #1: Pt will recall and demonstrate breathing exercises with 0 verbal cues on technique.  Plan Discharge plan remains appropriate    Co-evaluation                 AM-PAC OT "6 Clicks" Daily Activity     Outcome Measure   Help from another person eating meals?: None Help from another person taking care of personal grooming?: A Little Help from another person toileting, which includes using toliet, bedpan, or urinal?: A Little Help from another person bathing (including washing, rinsing, drying)?: A Little Help from another person to put on and taking off regular upper body clothing?: A Little Help from another person to put on and taking off regular lower body clothing?: A Little 6 Click Score: 19    End of Session    OT Visit Diagnosis: Unsteadiness on feet (R26.81);Muscle weakness (generalized) (M62.81)   Activity Tolerance Patient tolerated treatment well   Patient Left in chair;with call bell/phone  within reach   Nurse Communication Mobility status        Time: [id number] OT Time Calculation (min): 25 min  Charges: OT General Charges $OT Visit: 1 Visit OT Treatments $Self Care/Home Management : 8-22 mins $Therapeutic Activity: 8-22 mins  Mauri Brooklyn OTR/L [phone]    Mauri Brooklyn 01/25/2019, 1:36 PM

## 2019-01-25 NOTE — Plan of Care (Signed)

## 2019-01-25 NOTE — Plan of Care (Signed)
  Problem: Education: Goal: Knowledge of risk factors and measures for prevention of condition will improve Outcome: Progressing   Problem: Coping: Goal: Psychosocial and spiritual needs will be supported Outcome: Progressing   Problem: Respiratory: Goal: Will maintain a patent airway Outcome: Progressing   Problem: Respiratory: Goal: Complications related to the disease process, condition or treatment will be avoided or minimized Outcome: Progressing   Problem: Clinical Measurements: Goal: Respiratory complications will improve Outcome: Progressing   Problem: Nutrition: Goal: Adequate nutrition will be maintained Outcome: Progressing   Problem: Pain Managment: Goal: General experience of comfort will improve Outcome: Progressing   Problem: Safety: Goal: Ability to remain free from injury will improve Outcome: Progressing   Problem: Skin Integrity: Goal: Risk for impaired skin integrity will decrease Outcome: Progressing

## 2019-01-25 NOTE — Progress Notes (Signed)
Physical Therapy Treatment Patient Details Name: Robert Faulkner MRN: [id number] DOB: 1945/07/16 Today's Date: 01/25/2019    History of Present Illness Robert Faulkner is a 73 y.o. male with past medical history significant for essential hypertension and tremors; who presented to the emergency department complaining of general malaise, nausea/vomiting, intermittent episode of diarrhea and URI symptoms.  Patient symptoms started approximately over a week ago he was tested on 01/14/19 and turned out to be positive for coronavirus, he developed mild shortness of breath and came to the ER at any pain hospital from where he was sent to [facility] for further care.    PT Comments    The patient is in  [place] room. Placed O2 monitor probe on ear with 100% reading on RA. Patient ambulated x 400' on RA with SPO2 lowest 90%.  Patient currently will require 24/7 supervision due to periods of confusion and wife remains in hospital.   Follow Up Recommendations  Home health PT     Equipment Recommendations  None recommended by PT    Recommendations for Other Services       Precautions / Restrictions Precautions Precautions: Fall Precaution Comments: has been confused ,    Mobility  Bed Mobility               General bed mobility comments: OOB  Transfers   Equipment used: None   Sit to Stand: Supervision            Ambulation/Gait Ambulation/Gait assistance: Supervision Gait Distance (Feet): 400 Feet Assistive device: None Gait Pattern/deviations: Step-through pattern   Gait velocity interpretation: <1.31 ft/sec, indicative of household ambulator General Gait Details: able to stop, turn with very slight LOB noted. x 1   Stairs             Wheelchair Mobility    Modified Rankin (Stroke Patients Only)       Balance Overall balance assessment: Mild deficits observed, not formally tested                                          Cognition  Arousal/Alertness: Awake/alert Behavior During Therapy: Impulsive Overall Cognitive Status: Impaired/Different from baseline                                 General Comments: requires redirectiopn, had a confused episodes      Exercises      General Comments        Pertinent Vitals/Pain      Home Living                      Prior Function            PT Goals (current goals can now be found in the care plan section) Progress towards PT goals: Progressing toward goals    Frequency    Min 3X/week      PT Plan Current plan remains appropriate    Co-evaluation              AM-PAC PT "6 Clicks" Mobility   Outcome Measure  Help needed turning from your back to your side while in a flat bed without using bedrails?: None Help needed moving from lying on your back to sitting on the side of a flat bed without using bedrails?: None Help needed  moving to and from a bed to a chair (including a wheelchair)?: None Help needed standing up from a chair using your arms (e.g., wheelchair or bedside chair)?: None Help needed to walk in hospital room?: None Help needed climbing 3-5 steps with a railing? : A Little 6 Click Score: 23    End of Session   Activity Tolerance: Patient tolerated treatment well Patient left: in chair;with call bell/phone within reach;with nursing/sitter in room(in room with wife) Nurse Communication: Mobility status PT Visit Diagnosis: Unsteadiness on feet (R26.81)     Time: [id number] PT Time Calculation (min) (ACUTE ONLY): 24 min  Charges:  $Gait Training: 23-37 mins                     [place]  Office [phone]    Claretha Cooper 01/25/2019, 1:16 PM

## 2019-01-25 NOTE — Progress Notes (Signed)
SATURATION QUALIFICATIONS: (This note is used to comply with regulatory documentation for home oxygen)  Patient Saturations on Room Air at Rest = 95%  Patient Saturations on Room Air while Ambulating = 90% probe on ear  Patient Saturations on  Liters of oxygen while Ambulating =Did not ambulate with O2   Please briefly explain why patient needs home oxygen:does not require, SPO2 > 88%while ambulating.   [place]  Office [phone]

## 2019-01-26 LAB — CULTURE, BLOOD (ROUTINE X 2)
Culture: NO GROWTH
Culture: NO GROWTH
Special Requests: ADEQUATE
Special Requests: ADEQUATE

## 2019-01-26 LAB — CBC WITH DIFFERENTIAL/PLATELET
Abs Immature Granulocytes: 0.11 10*3/uL — ABNORMAL HIGH (ref 0.00–0.07)
Basophils Absolute: 0 10*3/uL (ref 0.0–0.1)
Basophils Relative: 0 %
Eosinophils Absolute: 0 10*3/uL (ref 0.0–0.5)
Eosinophils Relative: 0 %
HCT: 40.7 % (ref 39.0–52.0)
Hemoglobin: 13.8 g/dL (ref 13.0–17.0)
Immature Granulocytes: 1 %
Lymphocytes Relative: 8 %
Lymphs Abs: 0.7 10*3/uL (ref 0.7–4.0)
MCH: 30.6 pg (ref 26.0–34.0)
MCHC: 33.9 g/dL (ref 30.0–36.0)
MCV: 90.2 fL (ref 80.0–100.0)
Monocytes Absolute: 0.5 10*3/uL (ref 0.1–1.0)
Monocytes Relative: 5 %
Neutro Abs: 7.8 10*3/uL — ABNORMAL HIGH (ref 1.7–7.7)
Neutrophils Relative %: 86 %
Platelets: 202 10*3/uL (ref 150–400)
RBC: 4.51 MIL/uL (ref 4.22–5.81)
RDW: 11.9 % (ref 11.5–15.5)
WBC: 9.1 10*3/uL (ref 4.0–10.5)
nRBC: 0.2 % (ref 0.0–0.2)

## 2019-01-26 LAB — COMPREHENSIVE METABOLIC PANEL
ALT: 37 U/L (ref 0–44)
AST: 18 U/L (ref 15–41)
Albumin: 3 g/dL — ABNORMAL LOW (ref 3.5–5.0)
Alkaline Phosphatase: 58 U/L (ref 38–126)
Anion gap: 9 (ref 5–15)
BUN: 26 mg/dL — ABNORMAL HIGH (ref 8–23)
CO2: 27 mmol/L (ref 22–32)
Calcium: 8.8 mg/dL — ABNORMAL LOW (ref 8.9–10.3)
Chloride: 102 mmol/L (ref 98–111)
Creatinine, Ser: 0.72 mg/dL (ref 0.61–1.24)
GFR calc Af Amer: >60 mL/min (ref >60)
GFR calc non Af Amer: >60 mL/min (ref >60)
Glucose, Bld: 170 mg/dL — ABNORMAL HIGH (ref 70–99)
Potassium: 4.6 mmol/L (ref 3.5–5.1)
Sodium: 138 mmol/L (ref 135–145)
Total Bilirubin: 0.7 mg/dL (ref 0.3–1.2)
Total Protein: 5.9 g/dL — ABNORMAL LOW (ref 6.5–8.1)

## 2019-01-26 LAB — MAGNESIUM: Magnesium: 2 mg/dL (ref 1.7–2.4)

## 2019-01-26 LAB — C-REACTIVE PROTEIN: CRP: 0.9 mg/dL (ref <1.0)

## 2019-01-26 LAB — D-DIMER, QUANTITATIVE: D-Dimer, Quant: 1.49 ug/mL-FEU — ABNORMAL HIGH (ref 0.00–0.50)

## 2019-01-26 MED ORDER — FAMOTIDINE 20 MG PO TABS: 20.0000 mg | ORAL_TABLET | Freq: Every day | ORAL | 0 refills | Status: DC

## 2019-01-26 MED ORDER — ACETAMINOPHEN 325 MG PO TABS: 650.0000 mg | ORAL_TABLET | Freq: Four times a day (QID) | ORAL | Status: DC | PRN

## 2019-01-26 MED ORDER — AMLODIPINE BESYLATE 10 MG PO TABS: 10.0000 mg | ORAL_TABLET | Freq: Every day | ORAL | 0 refills | Status: DC

## 2019-01-26 MED ORDER — PREDNISONE 10 MG (21) PO TBPK: ORAL_TABLET | ORAL | 0 refills | Status: DC

## 2019-01-26 NOTE — TOC Transition Note (Addendum)
Transition of Care [facility]) - CM/SW Discharge Note   Patient Details  Name: Elvon Mcculloh MRN: [id number] Date of Birth: 1945-06-30  Transition of Care [facility]) CM/SW Contact:  Ninfa Meeker, RN Phone Number: 01/26/2019, 12:08 PM   Clinical Narrative:  Case manager is working on securing a Brewing technologist that will accept patient's insurance.Just received update that [facility] can provide service for patient. .  Will arrange for PTAR  Transport home, will print medical Necessity and facesheet to nursing unit.  PTAR will transport at 2:30pm. Bedside RN aware.           Patient Goals and CMS Choice        Discharge Placement                       Discharge Plan and Services                                     Social Determinants of Health (SDOH) Interventions     Readmission Risk Interventions No flowsheet data found.

## 2019-01-26 NOTE — Progress Notes (Signed)
PIV removed, AVS d/c instructions explained, pt verbalized understanding, no further questions. P-tar will transport pt home d/t lack of transportation.

## 2019-01-26 NOTE — TOC Transition Note (Deleted)
Transition of Care [facility]) - CM/SW Discharge Note   Patient Details  Name: Robert Faulkner MRN: [id number] Date of Birth: [birth year]  Transition of Care [facility]) CM/SW Contact:  Ninfa Meeker, RN Phone Number: 01/26/2019, 12:13 PM   Clinical Narrative:       Final next level of care: [place] Barriers to Discharge: No Barriers Identified   Patient Goals and CMS Choice        Discharge Placement                       Discharge Plan and Services     Post Acute Care Choice: Home Health          DME Arranged: N/A         HH Arranged: PT HH Agency: [place] Date [facility] Agency Contacted: 01/26/19 Time [place]: [id number] Representative spoke with at [place]: Adela Lank  Social Determinants of Health (SDOH) Interventions     Readmission Risk Interventions No flowsheet data found.

## 2019-01-26 NOTE — Discharge Summary (Signed)
Robert Faulkner, is a 73 y.o. male  DOB 1945-07-25  MRN [id number].  Admission date:  01/21/2019  Admitting Physician  No admitting provider for patient encounter.  Discharge Date:  01/26/2019   Primary MD  Terald Sleeper, PA-C  Recommendations for primary care physician for things to follow:  -Please check CBC, CMP during next visit. -Propranolol has been changed to Norvasc given symptomatic bradycardia during hospital stay   Admission Diagnosis  Dehydration [E86.0] Nausea vomiting and diarrhea [R11.2, R19.7] COVID-19 virus infection [U07.1]   Discharge Diagnosis  Dehydration [E86.0] Nausea vomiting and diarrhea [R11.2, R19.7] COVID-19 virus infection [U07.1]   Principal Problem:   Acute respiratory disease due to COVID-19 virus Active Problems:   Body mass index 31.0-31.9, adult   HTN (hypertension)   Hypoxia      Past Medical History:  Diagnosis Date  . Essential tremor   . Tremors of nervous system     Past Surgical History:  Procedure Laterality Date  . COLONOSCOPY N/A 09/21/2014   Procedure: COLONOSCOPY;  Surgeon: Danie Binder, MD;  Location: [facility];  Service: Endoscopy;  Laterality: N/A;  1200 - moved to 10:30 - office to notify       History of present illness and  Hospital Course:     Kindly see H&P for history of present illness and admission details, please review complete Labs, Consult reports and Test reports for all details in brief  HPI  from the history and physical done on the day of admission 01/21/2019  HPI: Robert Faulkner is a 73 y.o. male with past medical history significant for essential hypertension and tremors; who presented to the emergency department complaining of general malaise, nausea/vomiting, intermittent episode of diarrhea and URI symptoms.  Patient symptoms started approximately over a week ago he was tested on 01/14/19 and turned out  to be positive for coronavirus.  Patient was discharged home with instructions to isolate himself and monitor symptoms; steroids (prednisone) were provided.  Patient reports no fever, no chills, no chest pain, no hematemesis, no hematochezia, no melena, no dysuria, no hematuria, no abdominal pain.  He expressed having sudden inability to keep things down secondary to ongoing nausea and vomiting (specifically is present 2-3 emesis per day), feeling significantly weak, deconditioned and just not good.  He reported that his wife was recently admitted secondary to worsening coronavirus symptoms as well.  Nonproductive cough intermittently was also suppressed.  In the ED patient was found mildly tachypneic with minimal exertion; chest x-ray with bilateral infiltrates, no fever, normal WBCs, inflammatory markers essentially with elevated LDH, CRP and ESR.  Normal electrolytes, LFTs and renal function.  Patient desaturated to 86-87% percent on room air with acute stabilization of his oxygen saturation on 2 L nasal cannula.  [facility] consulted to admit patient for further evaluation and management of coronavirus infection.  Hospital Course    Acute Hypoxic Resp. Failure due to Acute Covid 19 Viral Pneumonitis during the ongoing 2020 Covid 19 Pandemic - he seems to have moderate  disease, started on IV steroids along with remdesivir with so far good results.    No further oxygen requirement, ambulating in the hallway with no hypoxia, he finished total of 5 days of remdesivir, he is to finish another prednisone taper as an outpatient .  HTN  -currently on Norvasc, beta-blocker was stopped due to resting bradycardia.  COVID -19 gastroenteritis.   - Resolved.   Discharge Condition:  stable    Discharge Instructions  and  Discharge Medications    Discharge Instructions    Discharge instructions   Complete by: As directed    Follow with Primary MD Terald Sleeper, PA-C in 14 days   Get CBC, CMP,   checked  by Primary MD next visit.    Activity: As tolerated with Full fall precautions use walker/cane & assistance as needed   Disposition Home    Diet: Heart Healthy.  On your next visit with your primary care physician please Get Medicines reviewed and adjusted.   Please request your Prim.MD to go over all Hospital Tests and Procedure/Radiological results at the follow up, please get all Hospital records sent to your Prim MD by signing hospital release before you go home.   If you experience worsening of your admission symptoms, develop shortness of breath, life threatening emergency, suicidal or homicidal thoughts you must seek medical attention immediately by calling 911 or calling your MD immediately  if symptoms less severe.  You Must read complete instructions/literature along with all the possible adverse reactions/side effects for all the Medicines you take and that have been prescribed to you. Take any new Medicines after you have completely understood and accpet all the possible adverse reactions/side effects.   Do not drive, operating heavy machinery, perform activities at heights, swimming or participation in water activities or provide baby sitting services if your were admitted for syncope or siezures until you have seen by Primary MD or a Neurologist and advised to do so again.  Do not drive when taking Pain medications.    Do not take more than prescribed Pain, Sleep and Anxiety Medications  Special Instructions: If you have smoked or chewed Tobacco  in the last 2 yrs please stop smoking, stop any regular Alcohol  and or any Recreational drug use.  Wear Seat belts while driving.   Please note  You were cared for by a hospitalist during your hospital stay. If you have any questions about your discharge medications or the care you received while you were in the hospital after you are discharged, you can call the unit and asked to speak with the hospitalist on call  if the hospitalist that took care of you is not available. Once you are discharged, your primary care physician will handle any further medical issues. Please note that NO REFILLS for any discharge medications will be authorized once you are discharged, as it is imperative that you return to your primary care physician (or establish a relationship with a primary care physician if you do not have one) for your aftercare needs so that they can reassess your need for medications and monitor your lab values.   Increase activity slowly   Complete by: As directed      Allergies as of 01/26/2019   No Known Allergies     Medication List    STOP taking these medications   amoxicillin 500 MG capsule Commonly known as: AMOXIL   propranolol 40 MG tablet Commonly known as: INDERAL     TAKE these  medications   acetaminophen 325 MG tablet Commonly known as: TYLENOL Take 2 tablets (650 mg total) by mouth every 6 (six) hours as needed for mild pain or headache (fever >/= 101).   amLODipine 10 MG tablet Commonly known as: NORVASC Take 1 tablet (10 mg total) by mouth daily. Start taking on: January 27, 2019   famotidine 20 MG tablet Commonly known as: PEPCID Take 1 tablet (20 mg total) by mouth daily. Start taking on: January 27, 2019   predniSONE 10 MG (21) Tbpk tablet Commonly known as: STERAPRED UNI-PAK 21 TAB As directed x 6 days         Diet and Activity recommendation: See Discharge Instructions above   Consults obtained - None   Major procedures and Radiology Reports - PLEASE review detailed and final reports for all details, in brief -     Dg Chest Port 1 View  Result Date: 01/21/2019 CLINICAL DATA:  73 year old who tested COVID-19 positive one week ago and has had vomiting since that time. He presents with generalized weakness. EXAM: PORTABLE CHEST 1 VIEW COMPARISON:  None. FINDINGS: Cardiac silhouette upper normal in size for [facility] portable technique. Scattered streaky and  patchy airspace opacities are present in both lungs. No confluent airspace consolidation. No pleural effusions. IMPRESSION: Scattered streaky and patchy airspace opacities in both lungs, possibly indicating bronchopneumonia, though may just represent scarring (there are no prior chest x-rays for comparison). Electronically Signed   By: Evangeline Dakin M.D.   On: 01/21/2019 12:05    Micro Results  Recent Results (from the past 240 hour(s))  Blood Culture (routine x 2)     Status: None   Collection Time: 01/21/19 10:57 AM   Specimen: Left Antecubital; Blood  Result Value Ref Range Status   Specimen Description   Final    LEFT ANTECUBITAL BOTTLES DRAWN AEROBIC AND ANAEROBIC   Special Requests Blood Culture adequate volume  Final   Culture   Final    NO GROWTH 5 DAYS Performed at [facility], [address] [place], [place] [address]    Report Status 01/26/2019 FINAL  Final  Blood Culture (routine x 2)     Status: None   Collection Time: 01/21/19 11:11 AM   Specimen: BLOOD LEFT ARM  Result Value Ref Range Status   Specimen Description BLOOD LEFT ARM BOTTLES DRAWN AEROBIC AND ANAEROBIC  Final   Special Requests Blood Culture adequate volume  Final   Culture   Final    NO GROWTH 5 DAYS Performed at [facility], [address], [place], [place] [address]    Report Status 01/26/2019 FINAL  Final       Today   Subjective:   Robert Faulkner today has no headache,no chest abdominal pain,no new weakness tingling or numbness, feels much better wants to go home today.   Objective:   Blood pressure 132/74, pulse 66, temperature 97.8 F (36.6 C), temperature source Oral, resp. rate 18, height _0  (1.803 m), weight 102.1 kg, SpO2 95 %.   Intake/Output Summary (Last 24 hours) at 01/26/2019 1133 Last data filed at 01/26/2019 0850 Gross per 24 hour  Intake 440 ml  Output -  Net 440 ml    Exam Awake Alert, Oriented x 3, No new F.N deficits, Normal affect Symmetrical Chest wall  movement, Good air movement bilaterally, CTAB RRR,No Gallops,Rubs or new Murmurs, No Parasternal Heave +ve B.Sounds, Abd Soft, Non tender,  No rebound -guarding or rigidity. No Cyanosis, Clubbing or edema, No new  Rash or bruise  Data Review   CBC w Diff:  Lab Results  Component Value Date   WBC 9.1 01/26/2019   HGB 13.8 01/26/2019   HGB 14.7 05/03/2018   HCT 40.7 01/26/2019   HCT 43.1 05/03/2018   PLT 202 01/26/2019   PLT 178 05/03/2018   LYMPHOPCT 8 01/26/2019   MONOPCT 5 01/26/2019   EOSPCT 0 01/26/2019   BASOPCT 0 01/26/2019    CMP:  Lab Results  Component Value Date   NA 138 01/26/2019   NA 141 05/03/2018   K 4.6 01/26/2019   CL 102 01/26/2019   CO2 27 01/26/2019   BUN 26 (H) 01/26/2019   BUN 12 05/03/2018   CREATININE 0.72 01/26/2019   PROT 5.9 (L) 01/26/2019   PROT 6.8 05/03/2018   ALBUMIN 3.0 (L) 01/26/2019   ALBUMIN 4.2 05/03/2018   BILITOT 0.7 01/26/2019   BILITOT 0.7 05/03/2018   ALKPHOS 58 01/26/2019   AST 18 01/26/2019   ALT 37 01/26/2019  .   Total Time in preparing paper work, data evaluation and todays exam - 46 minutes  Phillips Climes M.D on 01/26/2019 at 11:33 AM  [facility]   Office  [phone]

## 2019-01-26 NOTE — Discharge Instructions (Signed)
Person Under Monitoring Name: Robert Faulkner  Location: [address] [place] [address]   Infection Prevention Recommendations for Individuals Confirmed to have, or Being Evaluated for, 2019 Novel Coronavirus (COVID-19) Infection Who Receive Care at Home  Individuals who are confirmed to have, or are being evaluated for, COVID-19 should follow the prevention steps below until a healthcare provider or local or state health department says they can return to normal activities.  Stay home except to get medical care You should restrict activities outside your home, except for getting medical care. Do not go to work, school, or public areas, and do not use public transportation or taxis.  Call ahead before visiting your doctor Before your medical appointment, call the healthcare provider and tell them that you have, or are being evaluated for, COVID-19 infection. This will help the healthcare providers office take steps to keep other people from getting infected. Ask your healthcare provider to call the local or state health department.  Monitor your symptoms Seek prompt medical attention if your illness is worsening (e.g., difficulty breathing). Before going to your medical appointment, call the healthcare provider and tell them that you have, or are being evaluated for, COVID-19 infection. Ask your healthcare provider to call the local or state health department.  Wear a facemask You should wear a facemask that covers your nose and mouth when you are in the same room with other people and when you visit a healthcare provider. People who live with or visit you should also wear a facemask while they are in the same room with you.  Separate yourself from other people in your home As much as possible, you should stay in a different room from other people in your home. Also, you should use a separate bathroom, if available.  Avoid sharing household items You should  not share dishes, drinking glasses, cups, eating utensils, towels, bedding, or other items with other people in your home. After using these items, you should wash them thoroughly with soap and water.  Cover your coughs and sneezes Cover your mouth and nose with a tissue when you cough or sneeze, or you can cough or sneeze into your sleeve. Throw used tissues in a lined trash can, and immediately wash your hands with soap and water for at least 20 seconds or use an alcohol-based hand rub.  Wash your [facility] your hands often and thoroughly with soap and water for at least 20 seconds. You can use an alcohol-based hand sanitizer if soap and water are not available and if your hands are not visibly dirty. Avoid touching your eyes, nose, and mouth with unwashed hands.   Prevention Steps for Caregivers and Household Members of Individuals Confirmed to have, or Being Evaluated for, COVID-19 Infection Being Cared for in the Home  If you live with, or provide care at home for, a person confirmed to have, or being evaluated for, COVID-19 infection please follow these guidelines to prevent infection:  Follow healthcare providers instructions Make sure that you understand and can help the patient follow any healthcare provider instructions for all care.  Provide for the patients basic needs You should help the patient with basic needs in the home and provide support for getting groceries, prescriptions, and other personal needs.  Monitor the patients symptoms If they are getting sicker, call his or her medical provider and tell them that the patient has, or is being evaluated for, COVID-19 infection. This will help the healthcare providers  office take steps to keep other people from getting infected. Ask the healthcare provider to call the local or state health department.  Limit the number of people who have contact with the patient  If possible, have only one caregiver for the  patient.  Other household members should stay in another home or place of residence. If this is not possible, they should stay  in another room, or be separated from the patient as much as possible. Use a separate bathroom, if available.  Restrict visitors who do not have an essential need to be in the home.  Keep older adults, very young children, and other sick people away from the patient Keep older adults, very young children, and those who have compromised immune systems or chronic health conditions away from the patient. This includes people with chronic heart, lung, or kidney conditions, diabetes, and cancer.  Ensure good ventilation Make sure that shared spaces in the home have good air flow, such as from an air conditioner or an opened window, weather permitting.  Wash your hands often  Wash your hands often and thoroughly with soap and water for at least 20 seconds. You can use an alcohol based hand sanitizer if soap and water are not available and if your hands are not visibly dirty.  Avoid touching your eyes, nose, and mouth with unwashed hands.  Use disposable paper towels to dry your hands. If not available, use dedicated cloth towels and replace them when they become wet.  Wear a facemask and gloves  Wear a disposable facemask at all times in the room and gloves when you touch or have contact with the patients blood, body fluids, and/or secretions or excretions, such as sweat, saliva, sputum, nasal mucus, vomit, urine, or feces.  Ensure the mask fits over your nose and mouth tightly, and do not touch it during use.  Throw out disposable facemasks and gloves after using them. Do not reuse.  Wash your hands immediately after removing your facemask and gloves.  If your personal clothing becomes contaminated, carefully remove clothing and launder. Wash your hands after handling contaminated clothing.  Place all used disposable facemasks, gloves, and other waste in a lined  container before disposing them with other household waste.  Remove gloves and wash your hands immediately after handling these items.  Do not share dishes, glasses, or other household items with the patient  Avoid sharing household items. You should not share dishes, drinking glasses, cups, eating utensils, towels, bedding, or other items with a patient who is confirmed to have, or being evaluated for, COVID-19 infection.  After the person uses these items, you should wash them thoroughly with soap and water.  Wash laundry thoroughly  Immediately remove and wash clothes or bedding that have blood, body fluids, and/or secretions or excretions, such as sweat, saliva, sputum, nasal mucus, vomit, urine, or feces, on them.  Wear gloves when handling laundry from the patient.  Read and follow directions on labels of laundry or clothing items and detergent. In general, wash and dry with the warmest temperatures recommended on the label.  Clean all areas the individual has used often  Clean all touchable surfaces, such as counters, tabletops, doorknobs, bathroom fixtures, toilets, phones, keyboards, tablets, and bedside tables, every day. Also, clean any surfaces that may have blood, body fluids, and/or secretions or excretions on them.  Wear gloves when cleaning surfaces the patient has come in contact with.  Use a diluted bleach solution (e.g., dilute bleach with 1  part bleach and 10 parts water) or a household disinfectant with a label that says EPA-registered for coronaviruses. To make a bleach solution at home, add 1 tablespoon of bleach to 1 quart (4 cups) of water. For a larger supply, add  cup of bleach to 1 gallon (16 cups) of water.  Read labels of cleaning products and follow recommendations provided on product labels. Labels contain instructions for safe and effective use of the cleaning product including precautions you should take when applying the product, such as wearing gloves or  eye protection and making sure you have good ventilation during use of the product.  Remove gloves and wash hands immediately after cleaning.  Monitor yourself for signs and symptoms of illness Caregivers and household members are considered close contacts, should monitor their health, and will be asked to limit movement outside of the home to the extent possible. Follow the monitoring steps for close contacts listed on the symptom monitoring form.   ? If you have additional questions, contact your local health department or call the epidemiologist on call at [phone] (available 24/7). ? This guidance is subject to change. For the most up-to-date guidance from [facility], please refer to their website: YouBlogs.pl

## 2019-01-30 ENCOUNTER — Telehealth: Payer: Self-pay | Admitting: Physician Assistant

## 2019-01-30 ENCOUNTER — Other Ambulatory Visit: Payer: Self-pay | Admitting: Physician Assistant

## 2019-01-30 DIAGNOSIS — R918 Other nonspecific abnormal finding of lung field: Secondary | ICD-10-CM | POA: Diagnosis not present

## 2019-01-30 DIAGNOSIS — G250 Essential tremor: Secondary | ICD-10-CM | POA: Diagnosis not present

## 2019-01-30 DIAGNOSIS — J1289 Other viral pneumonia: Secondary | ICD-10-CM | POA: Diagnosis not present

## 2019-01-30 DIAGNOSIS — R001 Bradycardia, unspecified: Secondary | ICD-10-CM | POA: Diagnosis not present

## 2019-01-30 DIAGNOSIS — U071 COVID-19: Secondary | ICD-10-CM | POA: Diagnosis not present

## 2019-01-30 DIAGNOSIS — I10 Essential (primary) hypertension: Secondary | ICD-10-CM | POA: Diagnosis not present

## 2019-01-30 DIAGNOSIS — Z7952 Long term (current) use of systemic steroids: Secondary | ICD-10-CM | POA: Diagnosis not present

## 2019-01-30 DIAGNOSIS — Z9181 History of falling: Secondary | ICD-10-CM | POA: Diagnosis not present

## 2019-01-30 DIAGNOSIS — E860 Dehydration: Secondary | ICD-10-CM | POA: Diagnosis not present

## 2019-01-30 DIAGNOSIS — J9601 Acute respiratory failure with hypoxia: Secondary | ICD-10-CM | POA: Diagnosis not present

## 2019-01-30 MED ORDER — PROPRANOLOL HCL 10 MG PO TABS: 10.0000 mg | ORAL_TABLET | Freq: Three times a day (TID) | ORAL | 1 refills | Status: DC

## 2019-01-30 NOTE — Telephone Encounter (Signed)
Can they check his BP and pulse at home and let us know?  The propranolol wa keeping his pulse low, that is why it was stopped and norvasc was started.

## 2019-01-30 NOTE — Telephone Encounter (Signed)
I am sending in a prescription for propranolol 10 mg he will say to start with 1 tablet twice daily for 2 weeks and then go up to 1 3 times a day.  See if this has some improvement.  Please monitor blood pressure and pulse.  If it starts to go down again call us back.  Have a follow-up with an office visit in a month.

## 2019-01-30 NOTE — Telephone Encounter (Signed)
Patients BP today was 142/70 with a pulse of 74 per home health nurse.

## 2019-01-30 NOTE — Telephone Encounter (Signed)
Patient notified and verbalized understanding. Appt made for 1 month

## 2019-02-13 ENCOUNTER — Other Ambulatory Visit: Payer: Self-pay

## 2019-02-13 ENCOUNTER — Ambulatory Visit (INDEPENDENT_AMBULATORY_CARE_PROVIDER_SITE_OTHER): Payer: Medicare Other

## 2019-02-13 DIAGNOSIS — G250 Essential tremor: Secondary | ICD-10-CM

## 2019-02-13 DIAGNOSIS — R918 Other nonspecific abnormal finding of lung field: Secondary | ICD-10-CM

## 2019-02-13 DIAGNOSIS — J1289 Other viral pneumonia: Secondary | ICD-10-CM

## 2019-02-13 DIAGNOSIS — Z7952 Long term (current) use of systemic steroids: Secondary | ICD-10-CM

## 2019-02-13 DIAGNOSIS — J9601 Acute respiratory failure with hypoxia: Secondary | ICD-10-CM

## 2019-02-13 DIAGNOSIS — I10 Essential (primary) hypertension: Secondary | ICD-10-CM

## 2019-02-13 DIAGNOSIS — E860 Dehydration: Secondary | ICD-10-CM

## 2019-02-13 DIAGNOSIS — U071 COVID-19: Secondary | ICD-10-CM | POA: Diagnosis not present

## 2019-02-13 DIAGNOSIS — R001 Bradycardia, unspecified: Secondary | ICD-10-CM

## 2019-02-13 DIAGNOSIS — Z9181 History of falling: Secondary | ICD-10-CM

## 2019-03-02 ENCOUNTER — Other Ambulatory Visit: Payer: Self-pay

## 2019-03-03 ENCOUNTER — Ambulatory Visit (INDEPENDENT_AMBULATORY_CARE_PROVIDER_SITE_OTHER): Payer: Medicare Other | Admitting: Physician Assistant

## 2019-03-03 VITALS — BP 128/60 | HR 86

## 2019-03-03 DIAGNOSIS — I10 Essential (primary) hypertension: Secondary | ICD-10-CM

## 2019-03-03 MED ORDER — AMLODIPINE BESYLATE 10 MG PO TABS: 10.0000 mg | ORAL_TABLET | Freq: Every day | ORAL | 5 refills | Status: DC

## 2019-03-03 MED ORDER — PROPRANOLOL HCL 10 MG PO TABS: 10.0000 mg | ORAL_TABLET | Freq: Three times a day (TID) | ORAL | 5 refills | Status: DC

## 2019-03-03 NOTE — Progress Notes (Signed)
      Telephone visit  Subjective: [id number] PCP: Terald Sleeper, PA-C [id number] Zagorski is a 74 y.o. male calls for telephone consult today. Patient provides verbal consent for consult held via phone.  Patient is identified with 2 separate identifiers.  At this time the entire area is on COVID-19 social distancing and stay home orders are in place.  Patient is of higher risk and therefore we are performing this by a virtual method.  Location of patient: home Location of provider: [facility] Others present for call: no  This patient is having a follow-up from his hospitalization for Covid.  He is doing extremely well at this time.  His blood pressure was up while he was there they did some adjustments to his medicines.  However whenever he came out they had lowered his beta-blocker and his tremor was back.  So we raised back up his propranolol and have continue to watch.  He and his wife report that his blood pressure was 128/60 today and pulse of 86.  And the dose that we have amount of propranolol is doing a very good job for the tremors.   ROS: Per HPI  No Known Allergies Past Medical History:  Diagnosis Date  . Essential tremor   . Tremors of nervous system     Current Outpatient Medications:  .  acetaminophen (TYLENOL) 325 MG tablet, Take 2 tablets (650 mg total) by mouth every 6 (six) hours as needed for mild pain or headache (fever >/= 101)., Disp:  , Rfl:  .  amLODipine (NORVASC) 10 MG tablet, Take 1 tablet (10 mg total) by mouth daily., Disp: 30 tablet, Rfl: 5 .  propranolol (INDERAL) 10 MG tablet, Take 1 tablet (10 mg total) by mouth 3 (three) times daily. Start BID for 2 weeks, monitor pulse and blood pressure, Disp: 90 tablet, Rfl: 5  Assessment/ Plan: 74 y.o. male   1. Essential hypertension - amLODipine (NORVASC) 10 MG tablet; Take 1 tablet (10 mg total) by mouth daily.  Dispense: 30 tablet; Refill: 5 - propranolol (INDERAL) 10 MG tablet; Take 1 tablet (10 mg  total) by mouth 3 (three) times daily. Start BID for 2 weeks, monitor pulse and blood pressure  Dispense: 90 tablet; Refill: 5   No follow-ups on file.  Continue all other maintenance medications as listed above.  Start time: 3:44 PM End time: 3:54 PM  Meds ordered this encounter  Medications  . amLODipine (NORVASC) 10 MG tablet    Sig: Take 1 tablet (10 mg total) by mouth daily.    Dispense:  30 tablet    Refill:  5    Order Specific Question:   Supervising Provider    Answer:   Janora Norlander [id number]  . propranolol (INDERAL) 10 MG tablet    Sig: Take 1 tablet (10 mg total) by mouth 3 (three) times daily. Start BID for 2 weeks, monitor pulse and blood pressure    Dispense:  90 tablet    Refill:  5    Order Specific Question:   Supervising Provider    Answer:   Janora Norlander [id number]    Particia Nearing PA-C [place] [phone]

## 2019-03-05 ENCOUNTER — Encounter: Payer: Self-pay | Admitting: Physician Assistant

## 2019-03-08 DIAGNOSIS — Z7952 Long term (current) use of systemic steroids: Secondary | ICD-10-CM | POA: Diagnosis not present

## 2019-03-08 DIAGNOSIS — R001 Bradycardia, unspecified: Secondary | ICD-10-CM | POA: Diagnosis not present

## 2019-03-08 DIAGNOSIS — G250 Essential tremor: Secondary | ICD-10-CM | POA: Diagnosis not present

## 2019-03-08 DIAGNOSIS — J1289 Other viral pneumonia: Secondary | ICD-10-CM | POA: Diagnosis not present

## 2019-03-08 DIAGNOSIS — J9601 Acute respiratory failure with hypoxia: Secondary | ICD-10-CM | POA: Diagnosis not present

## 2019-03-08 DIAGNOSIS — Z9181 History of falling: Secondary | ICD-10-CM | POA: Diagnosis not present

## 2019-03-08 DIAGNOSIS — E860 Dehydration: Secondary | ICD-10-CM | POA: Diagnosis not present

## 2019-03-08 DIAGNOSIS — R918 Other nonspecific abnormal finding of lung field: Secondary | ICD-10-CM | POA: Diagnosis not present

## 2019-03-08 DIAGNOSIS — U071 COVID-19: Secondary | ICD-10-CM | POA: Diagnosis not present

## 2019-03-08 DIAGNOSIS — I10 Essential (primary) hypertension: Secondary | ICD-10-CM | POA: Diagnosis not present

## 2019-03-16 ENCOUNTER — Other Ambulatory Visit: Payer: Self-pay

## 2019-04-24 ENCOUNTER — Ambulatory Visit (INDEPENDENT_AMBULATORY_CARE_PROVIDER_SITE_OTHER): Payer: Medicare Other | Admitting: *Deleted

## 2019-04-24 DIAGNOSIS — Z0000 Encounter for general adult medical examination without abnormal findings: Secondary | ICD-10-CM | POA: Diagnosis not present

## 2019-04-24 NOTE — Progress Notes (Signed)
MEDICARE ANNUAL WELLNESS VISIT  04/24/2019  Telephone Visit Disclaimer This Medicare AWV was conducted by telephone due to national recommendations for restrictions regarding the COVID-19 Pandemic (e.g. social distancing).  I verified, using two identifiers, that I am speaking with Robert Faulkner or their authorized healthcare agent. I discussed the limitations, risks, security, and privacy concerns of performing an evaluation and management service by telephone and the potential availability of an in-person appointment in the future. The patient expressed understanding and agreed to proceed.   Subjective:  Robert Faulkner is a 74 y.o. male patient of Terald Sleeper, PA-C who had a Medicare Annual Wellness Visit today via telephone. Robert Faulkner is Retired and lives with his wife. He has 3 adult children. He reports that he is socially active and does interact with friends/family regularly. He is minimally physically active and enjoys baking bread, fishing, and go to church.  Patient Care Team: Theodoro Clock as PCP - General (Physician Assistant) Danie Binder, MD as Consulting Physician (Gastroenterology)  Advanced Directives 04/24/2019 01/21/2019 01/21/2019 01/21/2019 04/21/2018 09/09/2016 09/21/2014  Does Patient Have a Medical Advance Directive? Yes Yes Yes Yes Yes Yes No  Type of Paramedic of [place];Living will - Living will Living will [place];Living will Living will -  Does patient want to make changes to medical advance directive? - No - Patient declined No - Patient declined - No - Patient declined Yes (MAU/Ambulatory/Procedural Areas - Information given) -  Copy of [place] in Chart? No - copy requested - - - No - copy requested - -  Would patient like information on creating a medical advance directive? - - - - - - No - patient declined information    Hospital Utilization Over the Past 12 Months: # of  hospitalizations or ER visits: 1 # of surgeries: 0  Review of Systems    Patient reports that his overall health is unchanged compared to last year.  History obtained from chart review and the patient  Patient Reported Readings (BP, Pulse, CBG, Weight, etc) 124/69  Pain Assessment Pain : No/denies pain     Current Medications & Allergies (verified) Allergies as of 04/24/2019   No Known Allergies     Medication List       Accurate as of April 24, 2019  9:44 AM. If you have any questions, ask your nurse or doctor.        STOP taking these medications   acetaminophen 325 MG tablet Commonly known as: TYLENOL     TAKE these medications   amLODipine 10 MG tablet Commonly known as: NORVASC Take 1 tablet (10 mg total) by mouth daily.   naproxen sodium 220 MG tablet Commonly known as: ALEVE Take 220 mg by mouth 2 (two) times daily as needed.   propranolol 10 MG tablet Commonly known as: INDERAL Take 1 tablet (10 mg total) by mouth 3 (three) times daily. Start BID for 2 weeks, monitor pulse and blood pressure       History (reviewed): Past Medical History:  Diagnosis Date  . Essential tremor   . Hypertension   . Tremors of nervous system    Past Surgical History:  Procedure Laterality Date  . COLONOSCOPY N/A 09/21/2014   Procedure: COLONOSCOPY;  Surgeon: Danie Binder, MD;  Location: [facility];  Service: Endoscopy;  Laterality: N/A;  1200 - moved to 10:30 - office to notify   Family History  Problem Relation  Age of Onset  . Stroke Mother   . Stroke Father   . Arthritis Son   . Hyperlipidemia Son   . Hypertension Son   . Colon cancer Neg Hx    Social History   Socioeconomic History  . Marital status: Married    Spouse name: Not on file  . Number of children: 3  . Years of education: Not on file  . Highest education level: 8th grade  Occupational History  . Occupation: retired    Comment: [employer]  Tobacco Use  . Smoking  status: Former Smoker    Packs/day: 2.00    Years: 11.00    Pack years: 22.00    Quit date: 09/20/1977    Years since quitting: 41.6  . Smokeless tobacco: Never Used  Substance and Sexual Activity  . Alcohol use: No  . Drug use: No  . Sexual activity: Not on file  Other Topics Concern  . Not on file  Social History Narrative  . Not on file   Social Determinants of Health   Financial Resource Strain:   . Difficulty of Paying Living Expenses: Not on file  Food Insecurity:   . Worried About Charity fundraiser in the Last Year: Not on file  . Ran Out of Food in the Last Year: Not on file  Transportation Needs:   . Lack of Transportation (Medical): Not on file  . Lack of Transportation (Non-Medical): Not on file  Physical Activity:   . Days of Exercise per Week: Not on file  . Minutes of Exercise per Session: Not on file  Stress:   . Feeling of Stress : Not on file  Social Connections:   . Frequency of Communication with Friends and Family: Not on file  . Frequency of Social Gatherings with Friends and Family: Not on file  . Attends Religious Services: Not on file  . Active Member of Clubs or Organizations: Not on file  . Attends Archivist Meetings: Not on file  . Marital Status: Not on file    Activities of Daily Living In your present state of health, do you have any difficulty performing the following activities: 04/24/2019 01/21/2019  Hearing? Y Y  Comment Wears Hearing aids -  Vision? N N  Comment Wears glasses -  Difficulty concentrating or making decisions? N N  Walking or climbing stairs? N N  Dressing or bathing? N N  Doing errands, shopping? N N  Preparing Food and eating ? N -  Using the Toilet? N -  In the past six months, have you accidently leaked urine? N -  Do you have problems with loss of bowel control? N -  Managing your Medications? N -  Managing your Finances? N -  Housekeeping or managing your Housekeeping? N -  Some recent data might  be hidden    Patient Education/ Literacy How often do you need to have someone help you when you read instructions, pamphlets, or other written materials from your doctor or pharmacy?: 1 - Never What is the last grade level you completed in school?: 9th  Exercise Current Exercise Habits: Home exercise routine, Type of exercise: walking, Time (Minutes): 30, Frequency (Times/Week): 3, Weekly Exercise (Minutes/Week): 90, Intensity: Mild  Diet Patient reports consuming 3 meals a day and 3 snack(s) a day Patient reports that his primary diet is: Regular Patient reports that she does have regular access to food.   Depression Screen PHQ 2/9 Scores 04/24/2019 05/03/2018  04/21/2018 03/15/2017 09/16/2016 09/09/2016 10/29/2015  PHQ - 2 Score 0 0 0 0 0 0 0     Fall Risk Fall Risk  04/24/2019 05/03/2018 04/21/2018 03/15/2017 09/16/2016  Falls in the past year? 0 0 0 No No  Number falls in past yr: - - - - -  Injury with Fall? - - - - -  Risk Factor Category  - - - - -  Follow up - - - - -     Objective:  Robert Faulkner seemed alert and oriented and he participated appropriately during our telephone visit.  Blood Pressure Weight BMI  BP Readings from Last 3 Encounters:  03/05/19 128/60  01/26/19 132/74  05/03/18 (!) 115/58   Wt Readings from Last 3 Encounters:  01/21/19 225 lb (102.1 kg)  05/03/18 225 lb 12.8 oz (102.4 kg)  04/21/18 225 lb (102.1 kg)   BMI Readings from Last 1 Encounters:  01/21/19 31.38 kg/m    *Unable to obtain current vital signs, weight, and BMI due to telephone visit type  Hearing/Vision  . Shneur did not seem to have difficulty with hearing/understanding during the telephone conversation . Reports that he has not had a formal eye exam by an eye care professional within the past year . Reports that he has not had a formal hearing evaluation within the past year *Unable to fully assess hearing and vision during telephone visit type  Cognitive Function: No flowsheet  data found. (Normal:0-7, Significant for Dysfunction: >8)  Normal Cognitive Function Screening: Yes   Immunization & Health Maintenance Record Immunization History  Administered Date(s) Administered  . Fluad Quad(high Dose 65+) 12/28/2018  . Influenza, High Dose Seasonal PF 11/25/2015, 12/21/2016  . Influenza-Unspecified 12/04/2017  . Pneumococcal Conjugate-13 09/09/2016  . Pneumococcal Polysaccharide-23 04/21/2018    Health Maintenance  Topic Date Due  . Hepatitis C Screening  1945/11/05  . TETANUS/TDAP  03/10/1962  . INFLUENZA VACCINE  Completed  . PNA vac Low Risk Adult  Completed       Assessment  This is a routine wellness examination for Robert Faulkner.  Health Maintenance: Due or Overdue Health Maintenance Due  Topic Date Due  . Hepatitis C Screening  Oct 31, 1945  . TETANUS/TDAP  03/10/1962    Robert Faulkner does not need a referral for Community Assistance: Care Management:   no Social Work:    no Prescription Assistance:  no Nutrition/Diabetes Education:  no   Plan:  Personalized Goals Goals Addressed   None    Personalized Health Maintenance & Screening Recommendations  Td vaccine Yearly Eye exam  Lung Cancer Screening Recommended: no (Low Dose CT Chest recommended if Age 26-80 years, 30 pack-year currently smoking OR have quit w/in past 15 years) Hepatitis C Screening recommended: yes HIV Screening recommended: no  Advanced Directives: Written information was not prepared per patient's request.  Referrals & Orders No orders of the defined types were placed in this encounter.   Follow-up Plan . Follow-up with Terald Sleeper, PA-C as planned . Schedule your yearly eye exam . We will discuss tetanus vaccine at next in office appointment   I have personally reviewed and noted the following in the patient's chart:   . Medical and social history . Use of alcohol, tobacco or illicit drugs  . Current medications and  supplements . Functional ability and status . Nutritional status . Physical activity . Advanced directives . List of other physicians . Hospitalizations, surgeries, and ER visits in previous 12 months . Vitals . Screenings  to include cognitive, depression, and falls . Referrals and appointments  In addition, I have reviewed and discussed with Robert Faulkner certain preventive protocols, quality metrics, and best practice recommendations. A written personalized care plan for preventive services as well as general preventive health recommendations is available and can be mailed to the patient at his request.      Lynnea Ferrier, LPN  [id number]

## 2019-08-04 ENCOUNTER — Other Ambulatory Visit: Payer: Self-pay

## 2019-08-04 ENCOUNTER — Ambulatory Visit (INDEPENDENT_AMBULATORY_CARE_PROVIDER_SITE_OTHER): Payer: Medicare Other | Admitting: Family Medicine

## 2019-08-04 ENCOUNTER — Encounter: Payer: Self-pay | Admitting: Family Medicine

## 2019-08-04 VITALS — BP 124/71 | HR 48 | Temp 97.3°F | Ht 71.0 in | Wt 228.0 lb

## 2019-08-04 DIAGNOSIS — Z7689 Persons encountering health services in other specified circumstances: Secondary | ICD-10-CM

## 2019-08-04 DIAGNOSIS — G250 Essential tremor: Secondary | ICD-10-CM | POA: Diagnosis not present

## 2019-08-04 DIAGNOSIS — I10 Essential (primary) hypertension: Secondary | ICD-10-CM | POA: Diagnosis not present

## 2019-08-04 MED ORDER — PROPRANOLOL HCL 10 MG PO TABS: 10.0000 mg | ORAL_TABLET | Freq: Three times a day (TID) | ORAL | 12 refills | Status: DC

## 2019-08-04 NOTE — Progress Notes (Signed)
Subjective: CC: est care, HTN, tremor PCP: Janora Norlander, DO HOZ:[id number] Robert Faulkner is a 74 y.o. male presenting to clinic today for:  1. Hypertension Patient reports he is no longer on amlodipine.  He only takes propranolol 10 mg 3 times daily, which she also uses for his tremor as below.  No chest pain, shortness of breath, edema or falls.  2. Essential tremor He reports good control of tremor with propranolol 10 mg 3 times daily.  He still has some tremor in the hands and notes that his writing has gotten quite bad but prior to taking the medication he was much worse.  He denies any mental status changes, including memory loss.  No change in gait.  No family history of Parkinson disease.  Medication was initiated by his PCP.  He has never seen a neurologist.   ROS: Per HPI  No Known Allergies Past Medical History:  Diagnosis Date  . Essential tremor   . Hypertension   . Tremors of nervous system     Current Outpatient Medications:  .  amLODipine (NORVASC) 10 MG tablet, Take 1 tablet (10 mg total) by mouth daily., Disp: 30 tablet, Rfl: 5 .  naproxen sodium (ALEVE) 220 MG tablet, Take 220 mg by mouth 2 (two) times daily as needed., Disp: , Rfl:  .  propranolol (INDERAL) 10 MG tablet, Take 1 tablet (10 mg total) by mouth 3 (three) times daily. Start BID for 2 weeks, monitor pulse and blood pressure, Disp: 90 tablet, Rfl: 5 Social History   Socioeconomic History  . Marital status: Married    Spouse name: Not on file  . Number of children: 3  . Years of education: Not on file  . Highest education level: 8th grade  Occupational History  . Occupation: retired    Comment: [employer]  Tobacco Use  . Smoking status: Former Smoker    Packs/day: 2.00    Years: 11.00    Pack years: 22.00    Quit date: 09/20/1977    Years since quitting: 41.8  . Smokeless tobacco: Never Used  Vaping Use  . Vaping Use: Never used  Substance and Sexual Activity  .  Alcohol use: No  . Drug use: No  . Sexual activity: Not on file  Other Topics Concern  . Not on file  Social History Narrative  . Not on file   Social Determinants of Health   Financial Resource Strain:   . Difficulty of Paying Living Expenses:   Food Insecurity:   . Worried About Charity fundraiser in the Last Year:   . Arboriculturist in the Last Year:   Transportation Needs:   . Film/video editor (Medical):   Marland Kitchen Lack of Transportation (Non-Medical):   Physical Activity:   . Days of Exercise per Week:   . Minutes of Exercise per Session:   Stress:   . Feeling of Stress :   Social Connections:   . Frequency of Communication with Friends and Family:   . Frequency of Social Gatherings with Friends and Family:   . Attends Religious Services:   . Active Member of Clubs or Organizations:   . Attends Archivist Meetings:   Marland Kitchen Marital Status:   Intimate Partner Violence:   . Fear of Current or Ex-Partner:   . Emotionally Abused:   Marland Kitchen Physically Abused:   . Sexually Abused:    Family History  Problem Relation Age of Onset  . Stroke  Mother   . Stroke Father   . Arthritis Son   . Hyperlipidemia Son   . Hypertension Son   . Colon cancer Neg Hx     Objective: Office vital signs reviewed. BP 124/71   Pulse (!) 48   Temp (!) 97.3 F (36.3 C)   Ht _0  (1.803 m)   Wt 228 lb (103.4 kg)   SpO2 92%   BMI 31.80 kg/m   Physical Examination:  General: Awake, alert, well nourished, No acute distress HEENT: Normal, sclera white, MMM Cardio: Bradycardic with regular rhythm, S1S2 heard, no murmurs appreciated Pulm: clear to auscultation bilaterally, no wheezes, rhonchi or rales; normal work of breathing on room air Extremities: warm, well perfused, No edema, cyanosis or clubbing; +2 pulses bilaterally MSK: slow gait and slightly hunched station Skin: dry; intact; no rashes or lesions Neuro: Tremor noted.  Alert and oriented x3.  No other focal neurologic  deficits noted  Assessment/ Plan: 74 y.o. male   1. Essential hypertension Well-controlled.  His heart rate is on the low side but he is asymptomatic.  We will need to watch this carefully.  Check labs - propranolol (INDERAL) 10 MG tablet; Take 1 tablet (10 mg total) by mouth 3 (three) times daily.  Dispense: 90 tablet; Refill: 12 - CMP14+EGFR - Lipid Panel  2. Tremor, essential - propranolol (INDERAL) 10 MG tablet; Take 1 tablet (10 mg total) by mouth 3 (three) times daily.  Dispense: 90 tablet; Refill: 12 - CMP14+EGFR - TSH  3. Establishing care with new doctor, encounter for   No orders of the defined types were placed in this encounter.  No orders of the defined types were placed in this encounter.    Janora Norlander, DO [place] [phone]

## 2019-08-04 NOTE — Patient Instructions (Signed)

## 2019-08-05 LAB — LIPID PANEL
Chol/HDL Ratio: 5.8 ratio — ABNORMAL HIGH (ref 0.0–5.0)
Cholesterol, Total: 230 mg/dL — ABNORMAL HIGH (ref 100–199)
HDL: 40 mg/dL (ref >39)
LDL Chol Calc (NIH): 159 mg/dL — ABNORMAL HIGH (ref 0–99)
Triglycerides: 167 mg/dL — ABNORMAL HIGH (ref 0–149)
VLDL Cholesterol Cal: 31 mg/dL (ref 5–40)

## 2019-08-05 LAB — CMP14+EGFR
ALT: 36 IU/L (ref 0–44)
AST: 34 IU/L (ref 0–40)
Albumin/Globulin Ratio: 2.3 — ABNORMAL HIGH (ref 1.2–2.2)
Albumin: 4.3 g/dL (ref 3.7–4.7)
Alkaline Phosphatase: 75 IU/L (ref 48–121)
BUN/Creatinine Ratio: 17 (ref 10–24)
BUN: 15 mg/dL (ref 8–27)
Bilirubin Total: 1.1 mg/dL (ref 0.0–1.2)
CO2: 22 mmol/L (ref 20–29)
Calcium: 9.1 mg/dL (ref 8.6–10.2)
Chloride: 105 mmol/L (ref 96–106)
Creatinine, Ser: 0.87 mg/dL (ref 0.76–1.27)
GFR calc Af Amer: 97 mL/min/{1.73_m2} (ref >59)
GFR calc non Af Amer: 84 mL/min/{1.73_m2} (ref >59)
Globulin, Total: 1.9 g/dL (ref 1.5–4.5)
Glucose: 111 mg/dL — ABNORMAL HIGH (ref 65–99)
Potassium: 4.6 mmol/L (ref 3.5–5.2)
Sodium: 140 mmol/L (ref 134–144)
Total Protein: 6.2 g/dL (ref 6.0–8.5)

## 2019-08-05 LAB — TSH: TSH: 2.66 u[IU]/mL (ref 0.450–4.500)

## 2019-08-07 ENCOUNTER — Telehealth: Payer: Self-pay | Admitting: Family Medicine

## 2019-08-07 NOTE — Telephone Encounter (Signed)
Aware of results and recommendations.  Patient declines lipitor

## 2019-11-19 DIAGNOSIS — M542 Cervicalgia: Secondary | ICD-10-CM | POA: Diagnosis not present

## 2019-11-20 ENCOUNTER — Other Ambulatory Visit: Payer: Self-pay

## 2019-11-20 ENCOUNTER — Ambulatory Visit (INDEPENDENT_AMBULATORY_CARE_PROVIDER_SITE_OTHER): Payer: Medicare Other

## 2019-11-20 DIAGNOSIS — Z23 Encounter for immunization: Secondary | ICD-10-CM

## 2020-04-30 ENCOUNTER — Ambulatory Visit (INDEPENDENT_AMBULATORY_CARE_PROVIDER_SITE_OTHER): Payer: Medicare Other

## 2020-04-30 DIAGNOSIS — Z0000 Encounter for general adult medical examination without abnormal findings: Secondary | ICD-10-CM | POA: Diagnosis not present

## 2020-04-30 NOTE — Progress Notes (Signed)
MEDICARE ANNUAL WELLNESS VISIT  04/30/2020  Telephone Visit Disclaimer This Medicare AWV was conducted by telephone due to national recommendations for restrictions regarding the COVID-19 Pandemic (e.g. social distancing).  I verified, using two identifiers, that I am speaking with Robert Faulkner or their authorized healthcare agent. I discussed the limitations, risks, security, and privacy concerns of performing an evaluation and management service by telephone and the potential availability of an in-person appointment in the future. The patient expressed understanding and agreed to proceed.  Location of Patient: Home Location of Provider (nurse):  [facility]  Subjective:    Robert Faulkner is a 75 y.o. male patient of Janora Norlander, DO who had a Medicare Annual Wellness Visit today via telephone. Robert Faulkner is Retired and lives with their spouse. He has three children and two grandchildren. He reports that he is socially active and does interact with friends/family regularly. He is moderately physically active and enjoys fishing, working in the yard and working on Theme park manager.  Patient Care Team: Janora Norlander, DO as PCP - General (Family Medicine) Danie Binder, MD (Inactive) as Consulting Physician (Gastroenterology) Clinic, [place]  Advanced Directives 04/30/2020 04/24/2019 01/21/2019 01/21/2019 01/21/2019 04/21/2018 09/09/2016  Does Patient Have a Medical Advance Directive? No Yes Yes Yes Yes Yes Yes  Type of Advance Directive - [place];Living will - Living will Living will [place];Living will Living will  Does patient want to make changes to medical advance directive? - - No - Patient declined No - Patient declined - No - Patient declined Yes (MAU/Ambulatory/Procedural Areas - Information given)  Copy of [place] in Chart? - No - copy requested - - - No - copy requested -  Would patient like information on  creating a medical advance directive? No - Patient declined - - - - - [facility] Utilization Over the Past 12 Months: # of hospitalizations or ER visits: 0 # of surgeries: 0  Review of Systems    Patient reports that his overall health is unchanged compared to last year.  History obtained from chart review and the patient  Patient Reported Readings (BP, Pulse, CBG, Weight, etc) none  Pain Assessment Pain : No/denies pain     Current Medications & Allergies (verified) Allergies as of 04/30/2020   No Known Allergies     Medication List       Accurate as of April 30, 2020 10:02 AM. If you have any questions, ask your nurse or doctor.        propranolol 10 MG tablet Commonly known as: INDERAL Take 1 tablet (10 mg total) by mouth 3 (three) times daily.       History (reviewed): Past Medical History:  Diagnosis Date  . Essential tremor   . Hypertension   . Tremors of nervous system    Past Surgical History:  Procedure Laterality Date  . COLONOSCOPY N/A 09/21/2014   Procedure: COLONOSCOPY;  Surgeon: Danie Binder, MD;  [facility];  Service: Endoscopy;  Laterality: N/A;  1200 - moved to 10:30 - office to notify   Family History  Problem Relation Age of Onset  . Stroke Mother   . Stroke Father   . Arthritis Son   . Hyperlipidemia Son   . Hypertension Son   . Colon cancer Neg Hx    Social History   Socioeconomic History  . Marital status: Married    Spouse name: Not on  file  . Number of children: 3  . Years of education: Not on file  . Highest education level: 8th grade  Occupational History  . Occupation: retired    Comment: [employer]  Tobacco Use  . Smoking status: Former Smoker    Packs/day: 2.00    Years: 11.00    Pack years: 22.00    Quit date: 09/20/1977    Years since quitting: 42.6  . Smokeless tobacco: Never Used  Vaping Use  . Vaping Use: Never used  Substance and Sexual Activity  . Alcohol use: No  .  Drug use: No  . Sexual activity: Not on file  Other Topics Concern  . Not on file  Social History Narrative  . Not on file   Social Determinants of Health   Financial Resource Strain: Not on file  Food Insecurity: Not on file  Transportation Needs: Not on file  Physical Activity: Not on file  Stress: Not on file  Social Connections: Not on file    Activities of Daily Living In your present state of health, do you have any difficulty performing the following activities: 04/30/2020  Hearing? Y  Comment wears hearing aids in both ears  Difficulty concentrating or making decisions? N  Walking or climbing stairs? N  Dressing or bathing? N  Doing errands, shopping? N  Preparing Food and eating ? N  Using the Toilet? N  In the past six months, have you accidently leaked urine? N  Do you have problems with loss of bowel control? N  Managing your Medications? N  Managing your Finances? N  Housekeeping or managing your Housekeeping? N  Some recent data might be hidden  Patient wears hearing aids in both ears and does well with them.  He had a recent hearing evaluation at the [place].   Patient Education/ Literacy How often do you need to have someone help you when you read instructions, pamphlets, or other written materials from your doctor or pharmacy?: 1 - Never What is the last grade level you completed in school?: 8th grade  Exercise Current Exercise Habits: The patient does not participate in regular exercise at present, Exercise limited by: None identified  Diet Patient reports consuming 3 meals a day and 2 snack(s) a day Patient reports that his primary diet is: Regular Patient reports that he does have regular access to food.   Depression Screen PHQ 2/9 Scores 04/30/2020 08/04/2019 04/24/2019 05/03/2018 04/21/2018 03/15/2017 09/16/2016  PHQ - 2 Score 0 0 0 0 0 0 0     Fall Risk Fall Risk  04/30/2020 08/04/2019 04/24/2019 05/03/2018 04/21/2018  Falls in the past year? 0 0 0 0 0  Number  falls in past yr: - - - - -  Injury with Fall? - - - - -  Risk Factor Category  - - - - -  Follow up Falls evaluation completed - - - -     Objective:  Robert Faulkner seemed alert and oriented and he participated appropriately during our telephone visit.  Blood Pressure Weight BMI  BP Readings from Last 3 Encounters:  08/04/19 124/71  03/05/19 128/60  01/26/19 132/74   Wt Readings from Last 3 Encounters:  08/04/19 228 lb (103.4 kg)  01/21/19 225 lb (102.1 kg)  05/03/18 225 lb 12.8 oz (102.4 kg)   BMI Readings from Last 1 Encounters:  08/04/19 31.80 kg/m    *Unable to obtain current vital signs, weight, and BMI due to telephone visit type  Hearing/Vision  . Robert Faulkner did not seem to have difficulty with hearing/understanding during the telephone conversation . Reports that he has not had a formal eye exam by an eye care professional within the past year . Reports that he has had a formal hearing evaluation within the past year *Unable to fully assess hearing and vision during telephone visit type  Cognitive Function: 6CIT Screen 04/30/2020 04/24/2019  What Year? 0 points 0 points  What month? 0 points 0 points  What time? 0 points 0 points  Count back from 20 0 points 2 points  Months in reverse 0 points 0 points  Repeat phrase 2 points 2 points  Total Score 2 4   (Normal:0-7, Significant for Dysfunction: >8)  Normal Cognitive Function Screening: Yes   Immunization & Health Maintenance Record Immunization History  Administered Date(s) Administered  . Fluad Quad(high Dose 65+) 12/28/2018, 11/20/2019  . Influenza, High Dose Seasonal PF 11/25/2015, 12/21/2016  . Influenza-Unspecified 12/04/2017  . Moderna Sars-Covid-2 Vaccination 05/01/2019, 05/29/2019  . Pneumococcal Conjugate-13 09/09/2016  . Pneumococcal Polysaccharide-23 04/21/2018    Health Maintenance  Topic Date Due  . Hepatitis C Screening  Never done  . TETANUS/TDAP  Never done  . COVID-19 Vaccine (3 -  Booster for Moderna series) 11/28/2019  . INFLUENZA VACCINE  Completed  . PNA vac Low Risk Adult  Completed  . HPV VACCINES  Aged Out       Assessment  This is a routine wellness examination for Robert Faulkner.  Health Maintenance: Due or Overdue Health Maintenance Due  Topic Date Due  . Hepatitis C Screening  Never done  . TETANUS/TDAP  Never done  . COVID-19 Vaccine (3 - Booster for Moderna series) 11/28/2019    Robert Faulkner does not need a referral for Community Assistance: Care Management:   no Social Work:    no Prescription Assistance:  no Nutrition/Diabetes Education:  no   Plan:  Personalized Goals Goals Addressed            This Visit's Progress   . Patient Stated       04/30/2020 AWV Goal: Fall Prevention  . Over the next year, patient will decrease their risk for falls by: o Using assistive devices, such as a cane or walker, as needed o Identifying fall risks within their home and correcting them by: - Removing throw rugs - Adding handrails to stairs or ramps - Removing clutter and keeping a clear pathway throughout the home - Increasing light, especially at night - Adding shower handles/bars - Raising toilet seat o Identifying potential personal risk factors for falls: - Medication side effects - Incontinence/urgency - Vestibular dysfunction - Hearing loss - Musculoskeletal disorders - Neurological disorders - Orthostatic hypotension        Personalized Health Maintenance & Screening Recommendations  Td vaccine  Lung Cancer Screening Recommended: no (Low Dose CT Chest recommended if Age 75-80 years, 30 pack-year currently smoking OR have quit w/in past 15 years) Hepatitis C Screening recommended: no HIV Screening recommended: no  Advanced Directives: Written information was not prepared per patient's request.  Referrals & Orders No orders of the defined types were placed in this encounter.   Follow-up Plan . Follow-up with  Janora Norlander, DO as planned    I have personally reviewed and noted the following in the patient's chart:   . Medical and social history . Use of alcohol, tobacco or illicit drugs  . Current medications and supplements . Functional ability and status . Nutritional  status . Physical activity . Advanced directives . List of other physicians . Hospitalizations, surgeries, and ER visits in previous 12 months . Vitals . Screenings to include cognitive, depression, and falls . Referrals and appointments  In addition, I have reviewed and discussed with Robert Faulkner certain preventive protocols, quality metrics, and best practice recommendations. A written personalized care plan for preventive services as well as general preventive health recommendations is available and can be mailed to the patient at his request.      Felicity Coyer, LPN   10/24/4780  Patient declines after visit summary

## 2020-07-17 ENCOUNTER — Telehealth: Payer: Self-pay

## 2020-07-17 NOTE — Telephone Encounter (Signed)
I connected by phone with Robert Faulkner and/or patient's caregiver on 07/17/2020 at 3:39 PM to discuss the potential vaccination through our Homebound vaccination initiative.   Prevaccination Checklist for COVID-19 Vaccines  1.  Are you feeling sick today? no  2.  Have you ever received a dose of a COVID-19 vaccine?  yes      If yes, which one? Moderna   How many dose of Covid-19 vaccine have your received and dates? 3, 05/01/2019, 05/29/2019, 12/25/2019   Check all that apply: I live in a long-term care setting. no  I have been diagnosed with a medical condition(s). Please list: (pertinent to homebound status)  I am a first responder. no  I work in a long-term care facility, correctional facility, hospital, restaurant, retail setting, school, or other setting with high exposure to the public. no  4. Do you have a health condition or are you undergoing treatment that makes you moderately or severely immunocompromised? (This would include treatment for cancer or HIV, receipt of organ transplant, immunosuppressive therapy or high-dose corticosteroids, CAR-T-cell therapy, hematopoietic cell transplant [HCT], DiGeorge syndrome or Wiskott-Aldrich syndrome)  no  5. Have you received hematopoietic cell transplant (HCT) or CAR-T-cell therapies since receiving COVID-19 vaccine? no  6.  Have you ever had an allergic reaction: (This would include a severe reaction [ e.g., anaphylaxis] that required treatment with epinephrine or EpiPen or that caused you to go to the hospital.  It would also include an allergic reaction that occurred within 4 hours that caused hives, swelling, or respiratory distress, including wheezing.) A.  A previous dose of COVID-19 vaccine. no  B.  A vaccine or injectable therapy that contains multiple components, one of which is a COVID-19 vaccine component, but it is not known which component elicited the immediate reaction. no  C.  Are you allergic to polyethylene glycol? no  D.  Are you allergic to Polysorbate, which is found in some vaccines, film coated tablets and intravenous steroids?  no   7.  Have you ever had an allergic reaction to another vaccine (other than COVID-19 vaccine) or an injectable medication? (This would include a severe reaction [ e.g., anaphylaxis] that required treatment with epinephrine or EpiPen or that caused you to go to the hospital.  It would also include an allergic reaction that occurred within 4 hours that caused hives, swelling, or respiratory distress, including wheezing.)  no   8.  Have you ever had a severe allergic reaction (e.g., anaphylaxis) to something other than a component of the COVID-19 vaccine, or any vaccine or injectable medication?  This would include food, pet, venom, environmental, or oral medication allergies.  no   Check all that apply to you:  Am a male between ages 74 and 42 years old  no  Women 68 through 77 years of age can receive any FDA-authorized or -approved COVID-19 vaccine. However, they should be informed of the rare but increased risk of thrombosis with thrombocytopenia syndrome (TTS) after receipt of the [employer] Vaccine and the availability of other FDA-authorized and -approved COVID-19 vaccines. People who had TTS after a first dose of Janssen vaccine should not receive a subsequent dose of Janssen product    Am a male between ages 6 and 76 years old  no Males 5 through 77 years of age may receive the correct formulation of Pfizer-BioNTech COVID-19 vaccine. Males 18 and older can receive any FDA-authorized or -approved vaccine. However, people receiving an mRNA COVID-19 vaccine, especially males 12 through  77 years of age and their parents/legal representative (when relevant), should be informed of the risk of developing myocarditis (an inflammation of the heart muscle) or pericarditis (inflammation of the lining around the heart) after receipt of an mRNA vaccine. The risk of developing either  myocarditis or pericarditis after vaccination is low, and lower than the risk of myocarditis associated with SARS-CoV-2 infection in adolescents and adults. Vaccine recipients should be counseled about the need to seek care if symptoms of myocarditis or pericarditis develop after vaccination     Have a history of myocarditis or pericarditis  no Myocarditis or pericarditis after receipt of the first dose of an mRNA COVID-19 vaccine series but before administration of the second dose  Experts advise that people who develop myocarditis or pericarditis after a dose of an mRNA COVID-19 vaccine not receive a subsequent dose of any COVID-19 vaccine, until additional safety data are available.  Administration of a subsequent dose of COVID-19 vaccine before safety data are available can be considered in certain circumstances after the episode of myocarditis or pericarditis has completely resolved. Until additional data are available, some experts recommend a Alphonsa Overall COVID-19 vaccine be considered instead of an mRNA COVID-19 vaccine. Decisions about proceeding with a subsequent dose should include a conversation between the patient, their parent/legal representative (when relevant), and their clinical team, which may include a cardiologist.    Have been treated with monoclonal antibodies or convalescent serum to prevent or treat COVID-19  no Vaccination should be offered to people regardless of history of prior symptomatic or asymptomatic SARS-CoV-2 infection. There is no recommended minimal interval between infection and vaccination.  However, vaccination should be deferred if a patient received monoclonal antibodies or convalescent serum as treatment for COVID-19 or for post-exposure prophylaxis. This is a precautionary measure until additional information becomes available, to avoid interference of the antibody treatment with vaccine-induced immune responses.  Defer COVID-19 vaccination for 30 days when a  passive antibody product was used for post-exposure prophylaxis.  Defer COVID-19 vaccination for 90 days when a passive antibody product was used to treat COVID-19.     Diagnosed with Multisystem Inflammatory Syndrome (MIS-C or MIS-A) after a COVID-19 infection  no It is unknown if people with a history of MIS-C or MIS-A are at risk for a dysregulated immune response to COVID-19 vaccination.  People with a history of MIS-C or MIS-A may choose to be vaccinated. Considerations for vaccination may include:   Clinical recovery from MIS-C or MIS-A, including return to normal cardiac function   Personal risk of severe acute COVID-19 (e.g., age, underlying conditions)   High or substantial community transmission of SARS-CoV-2 and personal increased risk of reinfection.   Timing of any immunomodulatory therapies (general best practice guidelines for immunization can be consulted for more information [email or web address])   It has been 90 days or more since their diagnosis of MIS-C   Onset of MIS-C occurred before any COVID-19 vaccination   A conversation between the patient, their guardian(s), and their clinical team or a specialist may assist with COVID-19 vaccination decisions. Healthcare providers and health departments may also request a consultation from the [place] at [email or web address].     Have a bleeding disorder  no Take a blood thinner  no As with all vaccines, any COVID-19 vaccine product may be given to these patients, if a physician familiar with the patient's bleeding risk determines that the vaccine can be administered intramuscularly with reasonable safety.  ACIP recommends the following  technique for intramuscular vaccination in patients with bleeding disorders or taking blood thinners: a fine-gauge needle (23-gauge or smaller caliber) should be used  for the vaccination, followed by firm pressure on the site, without rubbing, for at least 2 minutes.  People who regularly take aspirin or anticoagulants as part of their routine medications do not need to stop these medications prior to receipt of any COVID-19 vaccine.    Have a history of heparin-induced thrombocytopenia (HIT)  no Although the etiology of TTS associated with the Alphonsa Overall COVID-19 vaccine is unclear, it appears to be similar to another rare immune-mediated syndrome, heparin-induced thrombocytopenia (HIT). People with a history of an episode of an immune-mediated syndrome characterized by thrombosis and thrombocytopenia, such as HIT, should be offered a currently FDA-approved or FDA-authorized mRNA COVID-19 vaccine if it has been ?90 days since their TTS resolved. After 90 days, patients may be vaccinated with any currently FDA-approved or FDA-authorized COVID-19 vaccine, including Janssen COVID-19 Vaccine. However, people who developed TTS after their initial Alphonsa Overall vaccine should not receive a Janssen booster dose.  Experts believe the following factors do not make people more susceptible to TTS after receipt of the [employer]. People with these conditions can be vaccinated with any FDA-authorized or - approved COVID-19 vaccine, including the [employer] COVID-19 Vaccine:   A prior history of venous thromboembolism   Risk factors for venous thromboembolism (e.g., inherited or acquired thrombophilia including Factor V Leiden; prothrombin gene 20210A mutation; antiphospholipid syndrome; protein C, protein S or antithrombin deficiency   A prior history of other types of thromboses not associated with thrombocytopenia   Pregnancy, post-partum status, or receipt of hormonal contraceptives (e.g., combined oral contraceptives, patch, ring)   Additional recipient education materials can be found at http://gutierrez-[email or web address]/ vaccines/safety/JJUpdate.html.    Am currently  pregnant or breastfeeding  no Vaccination is recommended for all people aged 77 years and older, including people that are:   Pregnant   Breastfeeding   Trying to get pregnant now or who might become pregnant in the future   Pregnant, breastfeeding, and post-partum people 37 through 77 years of age should be aware of the rare risk of TTS after receipt of the Alphonsa Overall COVID-19 Vaccine and the availability of other FDA-authorized or -approved COVID-19 vaccines (i.e., mRNA vaccines).    Have received dermal fillers  no FDA-authorized or -approved COVID-19 vaccines can be administered to people who have received injectable dermal fillers who have no contraindications for vaccination.  Infrequently, these people might experience temporary swelling at or near the site of filler injection (usually the face or lips) following administration of a dose of an mRNA COVID-19 vaccine. These people should be advised to contact their healthcare provider if swelling develops at or near the site of dermal filler following vaccination.     Have a history of Guillain-Barr Syndrome (GBS)  no People with a history of GBS can receive any FDA-authorized or -approved COVID-19 vaccine. However, given the possible association between the [employer] and an increased risk of GBS, a patient with a history of GBS and their clinical team should discuss the availability of mRNA vaccines to offer protection against COVID-19. The highest risk has been observed in men aged 69-64 years with symptoms of GBS beginning within 42 days after Alphonsa Overall COVID-19 vaccination.  People who had GBS after receiving Janssen vaccine should be made aware of the option to receive an mRNA COVID-19 vaccine booster at least 2 months (8 weeks) after the Janssen dose. However,  Janssen vaccine may be used as a booster, particularly if GBS occurred more than 42 days after vaccination or was related to a non-vaccine factor. Prior to booster  vaccination, a conversation between the patient and their clinical team may assist with decisions about use of a COVID-19 booster dose, including the timing of administration     Postvaccination Observation Times for People without Contraindications to Covid 19 Vaccination.  30 minutes:  People with a history of: A contraindication to another type of COVID-19 vaccine product (i.e., mRNA or viral vector COVID-19 vaccines)   Immediate (within 4 hours of exposure) non-severe allergic reaction to a COVID-19 vaccine or injectable therapies   Anaphylaxis due to any cause   Immediate allergic reaction of any severity to a non-COVID-19 vaccine   15 minutes: All other people  This patient is a 75 y.o. male that meets the FDA criteria to receive homebound vaccination. Patient or parent/caregiver understands they have the option to accept or refuse homebound vaccination.  Patient passed the pre-screening checklist and would like to proceed with homebound vaccination.  Based on questionnaire above, I recommend the patient be observed for 15 minutes.  There are an estimated #1 other household members/caregivers who are also interested in receiving the vaccine.    The patient has been confirmed homebound and eligible for homebound vaccination with the considerations outlined above. I will send the patient's information to our scheduling team who will reach out to schedule the patient and potential caregiver/family members for homebound vaccination.    Dan Humphreys 07/17/2020 3:39 PM

## 2020-07-24 ENCOUNTER — Ambulatory Visit: Payer: Medicare Other | Attending: Critical Care Medicine

## 2020-07-24 DIAGNOSIS — Z23 Encounter for immunization: Secondary | ICD-10-CM

## 2020-07-24 NOTE — Progress Notes (Signed)
   Covid-19 Vaccination [facility]  Name:  Robert Faulkner    MRN: [id number] DOB: 05/12/1945  07/24/2020  Robert Faulkner was observed post Covid-19 immunization for 15 minutes without incident. He was provided with Vaccine Information Sheet and instruction to access the V-Safe system.   Robert Faulkner was instructed to call 911 with any severe reactions post vaccine: Marland Kitchen Difficulty breathing  . Swelling of face and throat  . A fast heartbeat  . A bad rash all over body  . Dizziness and weakness   Immunizations Administered    Name Date Dose VIS Date Route   Moderna Covid-19 Booster Vaccine 07/24/2020 10:10 AM 0.25 mL 12/13/2019 Intramuscular   Manufacturer: Moderna   Lot: [id number]   [place]: [id number]

## 2020-11-14 ENCOUNTER — Other Ambulatory Visit: Payer: Self-pay | Admitting: Family Medicine

## 2020-11-14 DIAGNOSIS — G250 Essential tremor: Secondary | ICD-10-CM

## 2020-11-14 DIAGNOSIS — I10 Essential (primary) hypertension: Secondary | ICD-10-CM

## 2020-11-26 ENCOUNTER — Ambulatory Visit (INDEPENDENT_AMBULATORY_CARE_PROVIDER_SITE_OTHER): Payer: Medicare Other | Admitting: Family Medicine

## 2020-11-26 ENCOUNTER — Other Ambulatory Visit: Payer: Self-pay

## 2020-11-26 ENCOUNTER — Encounter: Payer: Self-pay | Admitting: Family Medicine

## 2020-11-26 VITALS — BP 149/70 | HR 52 | Temp 97.9°F | Ht 71.0 in | Wt 233.6 lb

## 2020-11-26 DIAGNOSIS — E782 Mixed hyperlipidemia: Secondary | ICD-10-CM | POA: Diagnosis not present

## 2020-11-26 DIAGNOSIS — Z0001 Encounter for general adult medical examination with abnormal findings: Secondary | ICD-10-CM

## 2020-11-26 DIAGNOSIS — I10 Essential (primary) hypertension: Secondary | ICD-10-CM | POA: Diagnosis not present

## 2020-11-26 DIAGNOSIS — F99 Mental disorder, not otherwise specified: Secondary | ICD-10-CM

## 2020-11-26 DIAGNOSIS — G250 Essential tremor: Secondary | ICD-10-CM

## 2020-11-26 DIAGNOSIS — Z0000 Encounter for general adult medical examination without abnormal findings: Secondary | ICD-10-CM

## 2020-11-26 DIAGNOSIS — F4321 Adjustment disorder with depressed mood: Secondary | ICD-10-CM

## 2020-11-26 DIAGNOSIS — Z23 Encounter for immunization: Secondary | ICD-10-CM

## 2020-11-26 DIAGNOSIS — Z130 Encounter for screening for diseases of the blood and blood-forming organs and certain disorders involving the immune mechanism: Secondary | ICD-10-CM | POA: Diagnosis not present

## 2020-11-26 DIAGNOSIS — R739 Hyperglycemia, unspecified: Secondary | ICD-10-CM | POA: Diagnosis not present

## 2020-11-26 DIAGNOSIS — Z125 Encounter for screening for malignant neoplasm of prostate: Secondary | ICD-10-CM

## 2020-11-26 LAB — BAYER DCA HB A1C WAIVED: HB A1C (BAYER DCA - WAIVED): 6.3 % — ABNORMAL HIGH (ref 4.8–5.6)

## 2020-11-26 MED ORDER — PROPRANOLOL HCL 10 MG PO TABS
10.0000 mg | ORAL_TABLET | Freq: Three times a day (TID) | ORAL | 3 refills | Status: DC
Start: 2020-11-26 — End: 2021-03-20

## 2020-11-26 NOTE — Progress Notes (Signed)
Robert Faulkner is a 75 y.o. male presents to office today for annual physical exam examination.    Concerns today include: 1. Tremor/ HTN Compliant with propranolol 10 mg 3 times daily for which he uses for both tremor and hypertension.  Previously treated with Norvasc but no longer required.  Denies any chest pain, shortness of breath  Occupation: Retired, Marital status: Widower, Substance use: None Diet: Admits that nonrestricted as many people are bringing food in after his wife's passing, Exercise: Stays active but no structured exercise Last eye exam: Up-to-date Last dental exam: Has dentures Last colonoscopy: UTD Refills needed today: Propranolol Immunizations needed: Immunization History  Administered Date(s) Administered   Fluad Quad(high Dose 65+) 12/28/2018, 11/20/2019, 11/26/2020   Influenza, High Dose Seasonal PF 11/25/2015, 12/21/2016   Influenza-Unspecified 12/04/2017   Moderna SARS-COV2 Booster Vaccination 07/24/2020   Moderna Sars-Covid-2 Vaccination 05/01/2019, 05/29/2019   Pneumococcal Conjugate-13 09/09/2016   Pneumococcal Polysaccharide-23 04/21/2018    Past Medical History:  Diagnosis Date   Essential tremor    Hypertension    Tremors of nervous system    Social History   Socioeconomic History   Marital status: Married    Spouse name: Not on file   Number of children: 3   Years of education: Not on file   Highest education level: 8th grade  Occupational History   Occupation: retired    Comment: [employer]  Tobacco Use   Smoking status: Former    Packs/day: 2.00    Years: 11.00    Pack years: 22.00    Types: Cigarettes    Quit date: 09/20/1977    Years since quitting: 43.2   Smokeless tobacco: Never  Vaping Use   Vaping Use: Never used  Substance and Sexual Activity   Alcohol use: No   Drug use: No   Sexual activity: Not on file  Other Topics Concern   Not on file  Social History Narrative   Not on file    Social Determinants of Health   Financial Resource Strain: Not on file  Food Insecurity: Not on file  Transportation Needs: Not on file  Physical Activity: Not on file  Stress: Not on file  Social Connections: Not on file  Intimate Partner Violence: Not on file   Past Surgical History:  Procedure Laterality Date   COLONOSCOPY N/A 09/21/2014   Procedure: COLONOSCOPY;  Surgeon: Danie Binder, MD;  Location: [facility];  Service: Endoscopy;  Laterality: N/A;  1200 - moved to 10:30 - office to notify   Family History  Problem Relation Age of Onset   Stroke Mother    Stroke Father    Arthritis Son    Hyperlipidemia Son    Hypertension Son    Colon cancer Neg Hx     Current Outpatient Medications:    doxylamine, Sleep, (UNISOM) 25 MG tablet, Take 25 mg by mouth at bedtime as needed., Disp: , Rfl:    propranolol (INDERAL) 10 MG tablet, Take 1 tablet (10 mg total) by mouth 3 (three) times daily., Disp: 270 tablet, Rfl: 3  No Known Allergies   ROS: Review of Systems Pertinent items noted in HPI and remainder of comprehensive ROS otherwise negative.    Physical exam BP (!) 149/70   Pulse (!) 52   Temp 97.9 F (36.6 C)   Ht '5\' 11"'  (1.803 m)   Wt 233 lb 9.6 oz (106 kg)   SpO2 (!) 52%   BMI 32.58 kg/m  General appearance:  alert, cooperative, appears stated age, and no distress Head: Normocephalic, without obvious abnormality, atraumatic Eyes: negative findings: lids and lashes normal, conjunctivae and sclerae normal, corneas clear, pupils equal, round, reactive to light and accomodation, and visual fields full to confrontation Ears: normal TM's and external ear canals both ears Nose: Nares normal. Septum midline. Mucosa normal. No drainage or sinus tenderness. Throat:  Oropharynx without masses, obstruction or erythema.  He has dentures up top with totally absent teeth on the bottom Neck: no adenopathy, supple, symmetrical, trachea midline, and thyroid not enlarged,  symmetric, no tenderness/mass/nodules Back: symmetric, no curvature. ROM normal. No CVA tenderness. Lungs: clear to auscultation bilaterally Chest wall: no tenderness Heart: bradycardic with regular rhythm, S1, S2 normal, no murmur, click, rub or gallop Abdomen: soft, non-tender; bowel sounds normal; no masses,  no organomegaly Extremities: extremities normal, atraumatic, no cyanosis or edema Pulses: 2+ and symmetric Skin: Skin color, texture, turgor normal. No rashes or lesions Lymph nodes: Cervical, supraclavicular, and axillary nodes normal. Neurologic: Grossly normal Psych: Mood depressed.  Patient is intermittently tearful.  Thought process is linear.  Depression screen [facility] 2/9 04/30/2020 08/04/2019 04/24/2019  Decreased Interest 0 0 0  Down, Depressed, Hopeless 0 0 0  PHQ - 2 Score 0 0 0   MMSE - Mini Mental State Exam 11/26/2020 04/21/2018 09/09/2016  Orientation to time '4 5 5  ' Orientation to Place '5 4 5  ' Registration '3 3 3  ' Attention/ Calculation 0 4 4  Recall 0 3 2  Language- name 2 objects '2 2 2  ' Language- repeat '1 1 1  ' Language- follow 3 step command '3 3 3  ' Language- read & follow direction '1 1 1  ' Write a sentence '1 1 1  ' Copy design '1 1 1  ' Total score '21 28 28     ' Assessment/ Plan: [name] here for annual physical exam.   Annual physical exam  Primary hypertension - Plan: CMP14+EGFR  Tremor, essential - Plan: CBC, propranolol (INDERAL) 10 MG tablet  Mixed hyperlipidemia - Plan: CMP14+EGFR, Lipid Panel, TSH, PSA  Elevated serum glucose - Plan: Bayer DCA Hb A1c Waived  Screening, anemia, deficiency, iron - Plan: CBC  Essential hypertension - Plan: propranolol (INDERAL) 10 MG tablet  Abnormal MMSE  Grief reaction  Need for immunization against influenza - Plan: Flu Vaccine QUAD High Dose(Fluad)  Screening for malignant neoplasm of prostate - Plan: PSA  Influenza vaccination administered today.  Blood pressure under acceptable control upon  recheck.  Continue propranolol  Tremor is chronic and stable.  Continue beta-blocker  Check NON fasting lipid  Glucose noted to be elevated previous visit so A1c ordered  Check CBC  MMSE noted to be abnormal I think that some of his grief/depression may be impacting.  Would like to recheck this in 6 months  Offered counseling services, medication but patient declined this today.  He scored 0 on the PHQ but I think that he is being stoic  PSA ordered for prostate cancer screening.  Denies any symptoms Counseled on healthy lifestyle choices, including diet (rich in fruits, vegetables and lean meats and low in salt and simple carbohydrates) and exercise (at least 30 minutes of moderate physical activity daily).  Patient to follow up in 1 year for annual exam or sooner if needed.  Zonya Gudger M. Lajuana Ripple, DO

## 2020-11-26 NOTE — Patient Instructions (Addendum)
Please reach out to me if the sadness gets worse.  We will recheck your memory test in 6 months.  Preventive Care 77 Years and Older, Male Preventive care refers to lifestyle choices and visits with your health care provider that can promote health and wellness. This includes: A yearly physical exam. This is also called an annual wellness visit. Regular dental and eye exams. Immunizations. Screening for certain conditions. Healthy lifestyle choices, such as: Eating a healthy diet. Getting regular exercise. Not using drugs or products that contain nicotine and tobacco. Limiting alcohol use. What can I expect for my preventive care visit? Physical exam Your health care provider will check your: Height and weight. These may be used to calculate your BMI (body mass index). BMI is a measurement that tells if you are at a healthy weight. Heart rate and blood pressure. Body temperature. Skin for abnormal spots. Counseling Your health care provider may ask you questions about your: Past medical problems. Family's medical history. Alcohol, tobacco, and drug use. Emotional well-being. Home life and relationship well-being. Sexual activity. Diet, exercise, and sleep habits. History of falls. Memory and ability to understand (cognition). Work and work Statistician. Access to firearms. What immunizations do I need? Vaccines are usually given at various ages, according to a schedule. Your health care provider will recommend vaccines for you based on your age, medical history, and lifestyle or other factors, such as travel or where you work. What tests do I need? Blood tests Lipid and cholesterol levels. These may be checked every 5 years, or more often depending on your overall health. Hepatitis C test. Hepatitis B test. Screening Lung cancer screening. You may have this screening every year starting at age 30 if you have a 30-pack-year history of smoking and currently smoke or have quit  within the past 15 years. Colorectal cancer screening. All adults should have this screening starting at age 27 and continuing until age 95. Your health care provider may recommend screening at age 5 if you are at increased risk. You will have tests every 1-10 years, depending on your results and the type of screening test. Prostate cancer screening. Recommendations will vary depending on your family history and other risks. Genital exam to check for testicular cancer or hernias. Diabetes screening. This is done by checking your blood sugar (glucose) after you have not eaten for a while (fasting). You may have this done every 1-3 years. Abdominal aortic aneurysm (AAA) screening. You may need this if you are a current or former smoker. STD (sexually transmitted disease) testing, if you are at risk. Follow these instructions at home: Eating and drinking  Eat a diet that includes fresh fruits and vegetables, whole grains, lean protein, and low-fat dairy products. Limit your intake of foods with high amounts of sugar, saturated fats, and salt. Take vitamin and mineral supplements as recommended by your health care provider. Do not drink alcohol if your health care provider tells you not to drink. If you drink alcohol: Limit how much you have to 0-2 drinks a day. Be aware of how much alcohol is in your drink. In the [place] one drink equals one 12 oz bottle of beer (355 mL), one 5 oz glass of wine (148 mL), or one 1 oz glass of hard liquor (44 mL). Lifestyle Take daily care of your teeth and gums. Brush your teeth every morning and night with fluoride toothpaste. Floss one time each day. Stay active. Exercise for at least 30 minutes 5 or more  days each week. Do not use any products that contain nicotine or tobacco, such as cigarettes, e-cigarettes, and chewing tobacco. If you need help quitting, ask your health care provider. Do not use drugs. If you are sexually active, practice safe sex. Use a  condom or other form of protection to prevent STIs (sexually transmitted infections). Talk with your health care provider about taking a low-dose aspirin or statin. Find healthy ways to cope with stress, such as: Meditation, yoga, or listening to music. Journaling. Talking to a trusted person. Spending time with friends and family. Safety Always wear your seat belt while driving or riding in a vehicle. Do not drive: If you have been drinking alcohol. Do not ride with someone who has been drinking. When you are tired or distracted. While texting. Wear a helmet and other protective equipment during sports activities. If you have firearms in your house, make sure you follow all gun safety procedures. What's next? Visit your health care provider once a year for an annual wellness visit. Ask your health care provider how often you should have your eyes and teeth checked. Stay up to date on all vaccines. This information is not intended to replace advice given to you by your health care provider. Make sure you discuss any questions you have with your health care provider. Document Revised: 04/19/2020 Document Reviewed: 02/03/2018 Elsevier Patient Education  2022 [employer].

## 2020-11-27 LAB — CBC
Hematocrit: 41.8 % (ref 37.5–51.0)
Hemoglobin: 14.3 g/dL (ref 13.0–17.7)
MCH: 30.7 pg (ref 26.6–33.0)
MCHC: 34.2 g/dL (ref 31.5–35.7)
MCV: 90 fL (ref 79–97)
Platelets: 150 10*3/uL (ref 150–450)
RBC: 4.66 x10E6/uL (ref 4.14–5.80)
RDW: 11.8 % (ref 11.6–15.4)
WBC: 6.4 10*3/uL (ref 3.4–10.8)

## 2020-11-27 LAB — LIPID PANEL
Chol/HDL Ratio: 6.2 ratio — ABNORMAL HIGH (ref 0.0–5.0)
Cholesterol, Total: 241 mg/dL — ABNORMAL HIGH (ref 100–199)
HDL: 39 mg/dL — ABNORMAL LOW (ref >39)
LDL Chol Calc (NIH): 151 mg/dL — ABNORMAL HIGH (ref 0–99)
Triglycerides: 277 mg/dL — ABNORMAL HIGH (ref 0–149)
VLDL Cholesterol Cal: 51 mg/dL — ABNORMAL HIGH (ref 5–40)

## 2020-11-27 LAB — CMP14+EGFR
ALT: 29 IU/L (ref 0–44)
AST: 28 IU/L (ref 0–40)
Albumin/Globulin Ratio: 1.9 (ref 1.2–2.2)
Albumin: 4.2 g/dL (ref 3.7–4.7)
Alkaline Phosphatase: 102 IU/L (ref 44–121)
BUN/Creatinine Ratio: 12 (ref 10–24)
BUN: 10 mg/dL (ref 8–27)
Bilirubin Total: 0.8 mg/dL (ref 0.0–1.2)
CO2: 23 mmol/L (ref 20–29)
Calcium: 9.5 mg/dL (ref 8.6–10.2)
Chloride: 103 mmol/L (ref 96–106)
Creatinine, Ser: 0.82 mg/dL (ref 0.76–1.27)
Globulin, Total: 2.2 g/dL (ref 1.5–4.5)
Glucose: 170 mg/dL — ABNORMAL HIGH (ref 70–99)
Potassium: 4.4 mmol/L (ref 3.5–5.2)
Sodium: 140 mmol/L (ref 134–144)
Total Protein: 6.4 g/dL (ref 6.0–8.5)
eGFR: 90 mL/min/{1.73_m2} (ref >59)

## 2020-11-27 LAB — TSH: TSH: 1.94 u[IU]/mL (ref 0.450–4.500)

## 2020-11-27 LAB — PSA: Prostate Specific Ag, Serum: 0.6 ng/mL (ref 0.0–4.0)

## 2020-11-29 ENCOUNTER — Other Ambulatory Visit: Payer: Self-pay

## 2020-11-29 MED ORDER — ROSUVASTATIN CALCIUM 10 MG PO TABS: 10.0000 mg | ORAL_TABLET | Freq: Every day | ORAL | 1 refills | Status: DC

## 2021-02-26 ENCOUNTER — Other Ambulatory Visit: Payer: Self-pay | Admitting: Family Medicine

## 2021-03-20 ENCOUNTER — Other Ambulatory Visit: Payer: Self-pay | Admitting: Family Medicine

## 2021-03-20 DIAGNOSIS — I10 Essential (primary) hypertension: Secondary | ICD-10-CM

## 2021-03-20 DIAGNOSIS — G250 Essential tremor: Secondary | ICD-10-CM

## 2021-05-02 ENCOUNTER — Ambulatory Visit (INDEPENDENT_AMBULATORY_CARE_PROVIDER_SITE_OTHER): Payer: Medicare Other

## 2021-05-02 VITALS — BP 130/66 | HR 61 | Temp 97.7°F | Wt 233.0 lb

## 2021-05-02 DIAGNOSIS — Z0000 Encounter for general adult medical examination without abnormal findings: Secondary | ICD-10-CM

## 2021-05-02 NOTE — Patient Instructions (Signed)
Robert Faulkner , Thank you for taking time to come for your Medicare Wellness Visit. I appreciate your ongoing commitment to your health goals. Please review the following plan we discussed and let me know if I can assist you in the future.   Screening recommendations/referrals: Colonoscopy: Done 09/21/2014 - likely no repeat required Recommended yearly ophthalmology/optometry visit for glaucoma screening and checkup Recommended yearly dental visit for hygiene and checkup  Vaccinations: Influenza vaccine: Done 11/26/2020 - Repeat annually Pneumococcal vaccine: done 09/09/2016 & 04/21/2018 Tdap vaccine: Due - recommended every 10 years Shingles vaccine: Due - recommend 2 doses 2-6 months apart for over 90% efficacy   Covid-19: Done 05/01/2019, 05/29/2019, 12/25/19, 07/24/20, & 12/07/2020  Advanced directives: Please bring a copy of your health care power of attorney and living will to the office to be added to your chart at your convenience.   Conditions/risks identified: Keep up the great work! Aim for 30 minutes of exercise or brisk walking, 6-8 glasses of water, and 5 servings of fruits and vegetables each day.  Next appointment: Follow up in one year for your annual wellness visit.   Preventive Care 74 Years and Older, Male  Preventive care refers to lifestyle choices and visits with your health care provider that can promote health and wellness. What does preventive care include? A yearly physical exam. This is also called an annual well check. Dental exams once or twice a year. Routine eye exams. Ask your health care provider how often you should have your eyes checked. Personal lifestyle choices, including: Daily care of your teeth and gums. Regular physical activity. Eating a healthy diet. Avoiding tobacco and drug use. Limiting alcohol use. Practicing safe sex. Taking low doses of aspirin every day. Taking vitamin and mineral supplements as recommended by your health care  provider. What happens during an annual well check? The services and screenings done by your health care provider during your annual well check will depend on your age, overall health, lifestyle risk factors, and family history of disease. Counseling  Your health care provider may ask you questions about your: Alcohol use. Tobacco use. Drug use. Emotional well-being. Home and relationship well-being. Sexual activity. Eating habits. History of falls. Memory and ability to understand (cognition). Work and work Statistician. Screening  You may have the following tests or measurements: Height, weight, and BMI. Blood pressure. Lipid and cholesterol levels. These may be checked every 5 years, or more frequently if you are over 79 years old. Skin check. Lung cancer screening. You may have this screening every year starting at age 58 if you have a 30-pack-year history of smoking and currently smoke or have quit within the past 15 years. Fecal occult blood test (FOBT) of the stool. You may have this test every year starting at age 36. Flexible sigmoidoscopy or colonoscopy. You may have a sigmoidoscopy every 5 years or a colonoscopy every 10 years starting at age 15. Prostate cancer screening. Recommendations will vary depending on your family history and other risks. Hepatitis C blood test. Hepatitis B blood test. Sexually transmitted disease (STD) testing. Diabetes screening. This is done by checking your blood sugar (glucose) after you have not eaten for a while (fasting). You may have this done every 1-3 years. Abdominal aortic aneurysm (AAA) screening. You may need this if you are a current or former smoker. Osteoporosis. You may be screened starting at age 58 if you are at high risk. Talk with your health care provider about your test results, treatment options,  and if necessary, the need for more tests. Vaccines  Your health care provider may recommend certain vaccines, such  as: Influenza vaccine. This is recommended every year. Tetanus, diphtheria, and acellular pertussis (Tdap, Td) vaccine. You may need a Td booster every 10 years. Zoster vaccine. You may need this after age 37. Pneumococcal 13-valent conjugate (PCV13) vaccine. One dose is recommended after age 61. Pneumococcal polysaccharide (PPSV23) vaccine. One dose is recommended after age 43. Talk to your health care provider about which screenings and vaccines you need and how often you need them. This information is not intended to replace advice given to you by your health care provider. Make sure you discuss any questions you have with your health care provider. Document Released: 03/08/2015 Document Revised: 10/30/2015 Document Reviewed: 12/11/2014 Elsevier Interactive Patient Education  2017 [place] Prevention in the Home Falls can cause injuries. They can happen to people of all ages. There are many things you can do to make your home safe and to help prevent falls. What can I do on the outside of my home? Regularly fix the edges of walkways and driveways and fix any cracks. Remove anything that might make you trip as you walk through a door, such as a raised step or threshold. Trim any bushes or trees on the path to your home. Use bright outdoor lighting. Clear any walking paths of anything that might make someone trip, such as rocks or tools. Regularly check to see if handrails are loose or broken. Make sure that both sides of any steps have handrails. Any raised decks and porches should have guardrails on the edges. Have any leaves, snow, or ice cleared regularly. Use sand or salt on walking paths during winter. Clean up any spills in your garage right away. This includes oil or grease spills. What can I do in the bathroom? Use night lights. Install grab bars by the toilet and in the tub and shower. Do not use towel bars as grab bars. Use non-skid mats or decals in the tub or  shower. If you need to sit down in the shower, use a plastic, non-slip stool. Keep the floor dry. Clean up any water that spills on the floor as soon as it happens. Remove soap buildup in the tub or shower regularly. Attach bath mats securely with double-sided non-slip rug tape. Do not have throw rugs and other things on the floor that can make you trip. What can I do in the bedroom? Use night lights. Make sure that you have a light by your bed that is easy to reach. Do not use any sheets or blankets that are too big for your bed. They should not hang down onto the floor. Have a firm chair that has side arms. You can use this for support while you get dressed. Do not have throw rugs and other things on the floor that can make you trip. What can I do in the kitchen? Clean up any spills right away. Avoid walking on wet floors. Keep items that you use a lot in easy-to-reach places. If you need to reach something above you, use a strong step stool that has a grab bar. Keep electrical cords out of the way. Do not use floor polish or wax that makes floors slippery. If you must use wax, use non-skid floor wax. Do not have throw rugs and other things on the floor that can make you trip. What can I do with my stairs? Do not leave  any items on the stairs. Make sure that there are handrails on both sides of the stairs and use them. Fix handrails that are broken or loose. Make sure that handrails are as long as the stairways. Check any carpeting to make sure that it is firmly attached to the stairs. Fix any carpet that is loose or worn. Avoid having throw rugs at the top or bottom of the stairs. If you do have throw rugs, attach them to the floor with carpet tape. Make sure that you have a light switch at the top of the stairs and the bottom of the stairs. If you do not have them, ask someone to add them for you. What else can I do to help prevent falls? Wear shoes that: Do not have high heels. Have  rubber bottoms. Are comfortable and fit you well. Are closed at the toe. Do not wear sandals. If you use a stepladder: Make sure that it is fully opened. Do not climb a closed stepladder. Make sure that both sides of the stepladder are locked into place. Ask someone to hold it for you, if possible. Clearly mark and make sure that you can see: Any grab bars or handrails. First and last steps. Where the edge of each step is. Use tools that help you move around (mobility aids) if they are needed. These include: Canes. Walkers. Scooters. Crutches. Turn on the lights when you go into a dark area. Replace any light bulbs as soon as they burn out. Set up your furniture so you have a clear path. Avoid moving your furniture around. If any of your floors are uneven, fix them. If there are any pets around you, be aware of where they are. Review your medicines with your doctor. Some medicines can make you feel dizzy. This can increase your chance of falling. Ask your doctor what other things that you can do to help prevent falls. This information is not intended to replace advice given to you by your health care provider. Make sure you discuss any questions you have with your health care provider. Document Released: 12/06/2008 Document Revised: 07/18/2015 Document Reviewed: 03/16/2014 Elsevier Interactive Patient Education  2017 [employer].

## 2021-05-02 NOTE — Progress Notes (Signed)
Subjective:   Robert Faulkner is a 76 y.o. male who presents for Medicare Annual/Subsequent preventive examination.  Virtual Visit via Telephone Note  I connected with  Robert Faulkner on 05/02/21 at 12:00 PM EST by telephone and verified that I am speaking with the correct person using two identifiers.  Location: Patient: Home Provider: [facility] Persons participating in the virtual visit: patient/Nurse Health Advisor   I discussed the limitations, risks, security and privacy concerns of performing an evaluation and management service by telephone and the availability of in person appointments. The patient expressed understanding and agreed to proceed.  Interactive audio and video telecommunications were attempted between this nurse and patient, however failed, due to patient having technical difficulties OR patient did not have access to video capability.  We continued and completed visit with audio only.  Some vital signs may be absent or patient reported.   Robert Lemme E Ming Mcmannis, LPN   Review of Systems     Cardiac Risk Factors include: advanced age (>78mn, >>72women);male gender;hypertension;obesity (BMI >30kg/m2)     Objective:    Today's Vitals   05/02/21 1200  BP: 130/66  Pulse: 61  Temp: 97.7 F (36.5 C)  SpO2: 95%  Weight: 233 lb (105.7 kg)   Body mass index is 32.5 kg/m.  Advanced Directives 04/30/2020 04/24/2019 01/21/2019 01/21/2019 01/21/2019 04/21/2018 09/09/2016  Does Patient Have a Medical Advance Directive? No Yes Yes Yes Yes Yes Yes  Type of Advance Directive - [place] - Living will Living will [place] will Living will  Does patient want to make changes to medical advance directive? - - No - Patient declined No - Patient declined - No - Patient declined Yes (MAU/Ambulatory/Procedural Areas - Information given)  Copy of HTown Linein Chart? - No - copy requested - - - No - copy requested  -  Would patient like information on creating a medical advance directive? No - Patient declined - - - - - -    Current Medications (verified) Outpatient Encounter Medications as of 05/02/2021  Medication Sig   doxylamine, Sleep, (UNISOM) 25 MG tablet Take 25 mg by mouth at bedtime as needed.   propranolol (INDERAL) 10 MG tablet TAKE ONE (1) TABLET THREE (3) TIMES EACH DAY   rosuvastatin (CRESTOR) 10 MG tablet Take 1 tablet (10 mg total) by mouth daily.   No facility-administered encounter medications on file as of 05/02/2021.    Allergies (verified) Patient has no known allergies.   History: Past Medical History:  Diagnosis Date   Essential tremor    Hypertension    Tremors of nervous system    Past Surgical History:  Procedure Laterality Date   COLONOSCOPY N/A 09/21/2014   Procedure: COLONOSCOPY;  Surgeon: SDanie Binder MD;  [facility];  Service: Endoscopy;  Laterality: N/A;  1200 - moved to 10:30 - office to notify   Family History  Problem Relation Age of Onset   Stroke Mother    Stroke Father    Arthritis Son    Hyperlipidemia Son    Hypertension Son    Colon cancer Neg Hx    Social History   Socioeconomic History   Marital status: Widowed    Spouse name: Not on file   Number of children: 3   Years of education: Not on file   Highest education level: 8th grade  Occupational History   Occupation: retired    Comment: [employer]  Tobacco Use   Smoking status: Former    Packs/day: 2.00    Years: 11.00    Pack years: 22.00    Types: Cigarettes    Quit date: 09/20/1977    Years since quitting: 43.6   Smokeless tobacco: Never  Vaping Use   Vaping Use: Never used  Substance and Sexual Activity   Alcohol use: No   Drug use: No   Sexual activity: Not on file  Other Topics Concern   Not on file  Social History Narrative   Not on file   Social Determinants of Health   Financial Resource Strain: Low Risk    Difficulty of  Paying Living Expenses: Not hard at all  Food Insecurity: No Food Insecurity   Worried About Charity fundraiser in the Last Year: Never true   Ran Out of Food in the Last Year: Never true  Transportation Needs: No Transportation Needs   Lack of Transportation (Medical): No   Lack of Transportation (Non-Medical): No  Physical Activity: Sufficiently Active   Days of Exercise per Week: 7 days   Minutes of Exercise per Session: 40 min  Stress: No Stress Concern Present   Feeling of Stress : Not at all  Social Connections: Moderately Integrated   Frequency of Communication with Friends and Family: Three times a week   Frequency of Social Gatherings with Friends and Family: More than three times a week   Attends Religious Services: More than 4 times per year   Active Member of Clubs or Organizations: Yes   Attends Archivist Meetings: More than 4 times per year   Marital Status: Widowed    Tobacco Counseling Counseling given: Not Answered   Clinical Intake:  Pre-visit preparation completed: Yes  Pain : No/denies pain     BMI - recorded: 32.5 Nutritional Status: BMI > 30  Obese Nutritional Risks: None Diabetes: No  How often do you need to have someone help you when you read instructions, pamphlets, or other written materials from your doctor or pharmacy?: 1 - Never  Diabetic? no  Interpreter Needed?: No  Information entered by :: Caiden Monsivais, LPN   Activities of Daily Living In your present state of health, do you have any difficulty performing the following activities: 05/02/2021  Hearing? N  Vision? N  Difficulty concentrating or making decisions? Y  Comment mild intermittent  Walking or climbing stairs? N  Dressing or bathing? N  Doing errands, shopping? N  Preparing Food and eating ? N  Using the Toilet? N  In the past six months, have you accidently leaked urine? N  Do you have problems with loss of bowel control? N  Managing your Medications? N   Managing your Finances? N  Housekeeping or managing your Housekeeping? N  Some recent data might be hidden    Patient Care Team: Janora Norlander, DO as PCP - General (Family Medicine) Danie Binder, MD (Inactive) as Consulting Physician (Gastroenterology) Clinic, [facility]  Indicate any recent Medical Services you may have received from other than [facility] providers in the past year (date may be approximate).     Assessment:   This is a routine wellness examination for [place].  Hearing/Vision screen Hearing Screening - Comments:: Wears hearing aids - from [facility]:: Wears rx glasses - up to date with routine eye exams with [facility]  Dietary issues and exercise activities discussed: Current Exercise Habits: Home exercise routine, Type of exercise:  walking;Other - see comments (cutting wood, farm work, yard work, house work), Time (Minutes): 60, Frequency (Times/Week): 7, Weekly Exercise (Minutes/Week): 420, Intensity: Moderate, Exercise limited by: None identified   Goals Addressed   None    Depression Screen PHQ 2/9 Scores 05/02/2021 04/30/2020 08/04/2019 04/24/2019 05/03/2018 04/21/2018 03/15/2017  PHQ - 2 Score 0 0 0 0 0 0 0    Fall Risk Fall Risk  05/02/2021 04/30/2020 08/04/2019 04/24/2019 05/03/2018  Falls in the past year? 0 0 0 0 0  Number falls in past yr: 0 - - - -  Injury with Fall? 0 - - - -  Risk Factor Category  - - - - -  Risk for fall due to : No Fall Risks - - - -  Follow up Falls prevention discussed Falls evaluation completed - - -    FALL RISK PREVENTION PERTAINING TO THE HOME:  Any stairs in or around the home? Yes  If so, are there any without handrails? No  Home free of loose throw rugs in walkways, pet beds, electrical cords, etc? Yes  Adequate lighting in your home to reduce risk of falls? Yes   ASSISTIVE DEVICES UTILIZED TO PREVENT FALLS:  Life alert? No  Use of a cane, walker or w/c? No  Grab  bars in the bathroom? Yes  Shower chair or bench in shower? Yes  Elevated toilet seat or a handicapped toilet? Yes   TIMED UP AND GO:  Was the test performed? No . Telephonic visit  Cognitive Function: MMSE - Mini Mental State Exam 11/26/2020 04/21/2018 09/09/2016  Orientation to time '4 5 5  '$ Orientation to Place '5 4 5  '$ Registration '3 3 3  '$ Attention/ Calculation 0 4 4  Recall 0 3 2  Language- name 2 objects '2 2 2  '$ Language- repeat '1 1 1  '$ Language- follow 3 step command '3 3 3  '$ Language- read & follow direction '1 1 1  '$ Write a sentence '1 1 1  '$ Copy design '1 1 1  '$ Total score '21 28 28     '$ 6CIT Screen 04/30/2020 04/24/2019  What Year? 0 points 0 points  What month? 0 points 0 points  What time? 0 points 0 points  Count back from 20 0 points 2 points  Months in reverse 0 points 0 points  Repeat phrase 2 points 2 points  Total Score 2 4    Immunizations Immunization History  Administered Date(s) Administered   Fluad Quad(high Dose 65+) 12/28/2018, 11/20/2019, 11/26/2020   Influenza, High Dose Seasonal PF 11/25/2015, 12/21/2016   Influenza-Unspecified 12/04/2017   Moderna SARS-COV2 Booster Vaccination 07/24/2020   Moderna Sars-Covid-2 Vaccination 05/01/2019, 05/29/2019, 12/25/2019, 07/24/2020, 12/07/2020   Pneumococcal Conjugate-13 09/09/2016   Pneumococcal Polysaccharide-23 04/21/2018    TDAP status: Due, Education has been provided regarding the importance of this vaccine. Advised may receive this vaccine at local pharmacy or Health Dept. Aware to provide a copy of the vaccination record if obtained from local pharmacy or Health Dept. Verbalized acceptance and understanding.  Flu Vaccine status: Up to date  Pneumococcal vaccine status: Up to date  Covid-19 vaccine status: Completed vaccines  Qualifies for Shingles Vaccine? Yes   Zostavax completed No   Shingrix Completed?: No.    Education has been provided regarding the importance of this vaccine. Patient has been advised to  call insurance company to determine out of pocket expense if they have not yet received this vaccine. Advised may also receive vaccine at local pharmacy or Health Dept.  Verbalized acceptance and understanding.  Screening Tests Health Maintenance  Topic Date Due   Zoster Vaccines- Shingrix (1 of 2) Never done   TETANUS/TDAP  11/26/2021 (Originally 03/10/1964)   Hepatitis C Screening  11/26/2021 (Originally 03/10/1963)   Pneumonia Vaccine 45+ Years old  Completed   INFLUENZA VACCINE  Completed   COVID-19 Vaccine  Completed   HPV VACCINES  Aged Out   COLONOSCOPY (Pts 45-67yr Insurance coverage will need to be confirmed)  Discontinued    Health Maintenance  Health Maintenance Due  Topic Date Due   Zoster Vaccines- Shingrix (1 of 2) Never done    Colorectal cancer screening: No longer required.   Lung Cancer Screening: (Low Dose CT Chest recommended if Age 78-80years, 30 pack-year currently smoking OR have quit w/in 15years.) does not qualify.  Additional Screening:  Hepatitis C Screening: does qualify; DUE  Vision Screening: Recommended annual ophthalmology exams for early detection of glaucoma and other disorders of the eye. Is the patient up to date with their annual eye exam?  Yes  Who is the provider or what is the name of the office in which the patient attends annual eye exams? [facility] is not established with a provider, would they like to be referred to a provider to establish care? No .   Dental Screening: Recommended annual dental exams for proper oral hygiene  Community Resource Referral / Chronic Care Management: CRR required this visit?  No   CCM required this visit?  No      Plan:     I have personally reviewed and noted the following in the patients chart:   Medical and social history Use of alcohol, tobacco or illicit drugs  Current medications and supplements including opioid prescriptions. Patient is not currently taking opioid  prescriptions. Functional ability and status Nutritional status Physical activity Advanced directives List of other physicians Hospitalizations, surgeries, and ER visits in previous 12 months Vitals Screenings to include cognitive, depression, and falls Referrals and appointments  In addition, I have reviewed and discussed with patient certain preventive protocols, quality metrics, and best practice recommendations. A written personalized care plan for preventive services as well as general preventive health recommendations were provided to patient.     ASandrea Hammond LPN   [id number]  Nurse Notes: None

## 2021-05-28 ENCOUNTER — Other Ambulatory Visit: Payer: Self-pay | Admitting: Family Medicine

## 2021-05-28 NOTE — Telephone Encounter (Signed)
OV 11/26/20 RTC 1 yr ?

## 2021-07-10 ENCOUNTER — Other Ambulatory Visit: Payer: Self-pay | Admitting: Family Medicine

## 2021-07-10 DIAGNOSIS — G250 Essential tremor: Secondary | ICD-10-CM

## 2021-07-10 DIAGNOSIS — I10 Essential (primary) hypertension: Secondary | ICD-10-CM

## 2021-08-22 ENCOUNTER — Other Ambulatory Visit: Payer: Self-pay | Admitting: Family Medicine

## 2021-08-22 DIAGNOSIS — G250 Essential tremor: Secondary | ICD-10-CM

## 2021-08-22 DIAGNOSIS — I10 Essential (primary) hypertension: Secondary | ICD-10-CM

## 2021-08-22 NOTE — Telephone Encounter (Signed)
[place]. NTBS 30 days given 07/10/21

## 2021-08-25 NOTE — Telephone Encounter (Signed)
Made pt first available appt with Dr Darnell Level on Aug 9

## 2021-09-09 DIAGNOSIS — R309 Painful micturition, unspecified: Secondary | ICD-10-CM | POA: Diagnosis not present

## 2021-09-09 DIAGNOSIS — N481 Balanitis: Secondary | ICD-10-CM | POA: Diagnosis not present

## 2021-09-15 ENCOUNTER — Encounter: Payer: Self-pay | Admitting: Family Medicine

## 2021-09-15 ENCOUNTER — Ambulatory Visit (INDEPENDENT_AMBULATORY_CARE_PROVIDER_SITE_OTHER): Payer: Medicare Other | Admitting: Family Medicine

## 2021-09-15 VITALS — BP 120/69 | HR 74 | Temp 97.4°F | Ht 71.0 in | Wt 212.2 lb

## 2021-09-15 DIAGNOSIS — R251 Tremor, unspecified: Secondary | ICD-10-CM

## 2021-09-15 DIAGNOSIS — R739 Hyperglycemia, unspecified: Secondary | ICD-10-CM | POA: Diagnosis not present

## 2021-09-15 DIAGNOSIS — R631 Polydipsia: Secondary | ICD-10-CM | POA: Diagnosis not present

## 2021-09-15 DIAGNOSIS — E1169 Type 2 diabetes mellitus with other specified complication: Secondary | ICD-10-CM | POA: Insufficient documentation

## 2021-09-15 DIAGNOSIS — R278 Other lack of coordination: Secondary | ICD-10-CM

## 2021-09-15 DIAGNOSIS — R81 Glycosuria: Secondary | ICD-10-CM

## 2021-09-15 DIAGNOSIS — R110 Nausea: Secondary | ICD-10-CM | POA: Diagnosis not present

## 2021-09-15 DIAGNOSIS — E119 Type 2 diabetes mellitus without complications: Secondary | ICD-10-CM | POA: Insufficient documentation

## 2021-09-15 DIAGNOSIS — E785 Hyperlipidemia, unspecified: Secondary | ICD-10-CM

## 2021-09-15 LAB — BAYER DCA HB A1C WAIVED: HB A1C (BAYER DCA - WAIVED): 10.6 % — ABNORMAL HIGH (ref 4.8–5.6)

## 2021-09-15 MED ORDER — TRESIBA FLEXTOUCH 100 UNIT/ML ~~LOC~~ SOPN: 10.0000 [IU] | PEN_INJECTOR | Freq: Every day | SUBCUTANEOUS | 0 refills | Status: DC

## 2021-09-15 NOTE — Progress Notes (Signed)
Subjective: CC: Elevated blood sugar PCP: Janora Norlander, DO CLE:[id number] Voorhis is a 76 y.o. male presenting to clinic today for:  1.  Elevated blood sugar Patient was seen at urgent care today.  The provider, Dietrich Pates, actually contacted me with concerns about very elevated blood sugars there.  He was being evaluated due to polydipsia and polyuria.  UA noted gross glucosuria and his blood sugars were found to be well into the 4 and 500s.  He was given 12 units of Humulin R in the office and he notes that this did help him feel better.  He is hydrating as directed but apparently they have purchased Gatorade with sugar and he did not understand that he was not supposed to intake more sugar.  He denies any dizziness, he feels like his saliva is normalizing and he is less thirsty.  No chest pain or shortness of breath.  Family history is quite notable for type 2 diabetes in his sister, brother and his son.  He denies any visual disturbance, excessive fatigue.  Last exam was back in October and he had a prediabetic A1c at 6.2.  He pretty much just eats what he wants to.   ROS: Per HPI  No Known Allergies Past Medical History:  Diagnosis Date   Essential tremor    Hypertension    Tremors of nervous system     Current Outpatient Medications:    doxylamine, Sleep, (UNISOM) 25 MG tablet, Take 25 mg by mouth at bedtime as needed., Disp: , Rfl:    propranolol (INDERAL) 10 MG tablet, Take 1 tablet (10 mg total) by mouth 3 (three) times daily. (NEEDS TO BE SEEN BEFORE NEXT REFILL), Disp: 90 tablet, Rfl: 0   rosuvastatin (CRESTOR) 10 MG tablet, TAKE ONE (1) TABLET BY MOUTH EVERY DAY, Disp: 90 tablet, Rfl: 1 Social History   Socioeconomic History   Marital status: Widowed    Spouse name: Not on file   Number of children: 3   Years of education: Not on file   Highest education level: 8th grade  Occupational History   Occupation: retired    Comment: [employer]   Tobacco Use   Smoking status: Former    Packs/day: 2.00    Years: 11.00    Total pack years: 22.00    Types: Cigarettes    Quit date: 09/20/1977    Years since quitting: 44.0   Smokeless tobacco: Never  Vaping Use   Vaping Use: Never used  Substance and Sexual Activity   Alcohol use: No   Drug use: No   Sexual activity: Not on file  Other Topics Concern   Not on file  Social History Narrative   Lives in 2 story home alone - wife passed in 2022 - his children live nearby and visit often   He's involved in church   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, cuts wood, always working)   Social Determinants of Health   Financial Resource Strain: [place]  (05/02/2021)   Overall Financial Resource Strain (CARDIA)    Difficulty of Paying Living Expenses: Not hard at all  Food Insecurity: No [place] (05/02/2021)   Hunger Vital Sign    Worried About Running Out of Food in the Last Year: Never true    [place] in the Last Year: Never true  Transportation Needs: No Transportation Needs (05/02/2021)   PRAPARE - Hydrologist (Medical): No  Lack of Transportation (Non-Medical): No  Physical Activity: Sufficiently Active (05/02/2021)   Exercise Vital Sign    Days of Exercise per Week: 7 days    Minutes of Exercise per Session: 40 min  Stress: No Stress Concern Present (05/02/2021)   [place]    Feeling of Stress : Not at all  Social Connections: Moderately Integrated (05/02/2021)   Social Connection and Isolation Panel [NHANES]    Frequency of Communication with Friends and Family: Three times a week    Frequency of Social Gatherings with Friends and Family: More than three times a week    Attends Religious Services: More than 4 times per year    Active Member of Clubs or Organizations: Yes    Attends Archivist Meetings: More than 4 times per year    Marital  Status: Widowed  Intimate Partner Violence: Not At Risk (05/02/2021)   Humiliation, Afraid, Rape, and Kick questionnaire    Fear of Current or Ex-Partner: No    Emotionally Abused: No    Physically Abused: No    Sexually Abused: No   Family History  Problem Relation Age of Onset   Stroke Mother    Stroke Father    Arthritis Son    Hyperlipidemia Son    Hypertension Son    Colon cancer Neg Hx     Objective: Office vital signs reviewed. BP 120/69   Pulse 74   Temp (!) 97.4 F (36.3 C)   Ht $R'5\' 11"'FF$  (1.803 m)   Wt 212 lb 3.2 oz (96.3 kg)   SpO2 93%   BMI 29.60 kg/m   Physical Examination:  General: Awake, alert, nontoxic male, No acute distress HEENT: Sclera white. Cardio: regular rate and rhythm  Pulm:  normal work of breathing on room air MSK: Ambulating independently Neuro: tremor present  Assessment/ Plan: 76 y.o. male   New onset type 2 diabetes mellitus ([place]) - Plan: AMB Referral to [facility], insulin degludec (TRESIBA FLEXTOUCH) 100 UNIT/ML FlexTouch Pen  Hyperlipidemia associated with type 2 diabetes mellitus ([place])  Polydipsia - Plan: CMP14+EGFR, Bayer DCA Hb A1c Waived  Glucosuria - Plan: CMP14+EGFR, Bayer DCA Hb A1c Waived, Microalbumin / creatinine urine ratio  Tremor  Poor manual dexterity  He has lost about 20 pounds since I saw him in October.  Uncertain if this is intentional but suspect is due to unrecognized hyperglycemia.  Today, A1c 10.6 here in the office.  Point-of-care glucose was unreadable so I gave him 10 units of Tresiba 100 here in office and collected CMP, urine micro.  Pending electrolyte balance, renal function etc. we will plan to initiate metformin plus or minus Januvia.  In the interim, we will plan to continue with [place] for sugar control until he is no longer at risk for severe dehydration.  Likely, he is normotensive and had no ketones appreciated on the urine specimen that was collected at the urgent care.  I  discussed carb restriction at length with him today and handouts were provided.  I am referring him to CCM for further teaching, medication monitoring and blood sugar monitoring.  I worry that given his tremor he will not be able to do point-of-care fingersticks and will likely need a CGM.  Hopefully they can help [place] get this in this patient  Is already on a statin.  He is to continue that.  Orders Placed This Encounter  Procedures   CMP14+EGFR   [facility]  Hb A1c Waived   Microalbumin / creatinine urine ratio   No orders of the defined types were placed in this encounter.  Total time spent with patient 32 mins.  Greater than 50% of encounter spent in coordination of care/counseling.   Janora Norlander, DO [place] [phone]

## 2021-09-15 NOTE — Patient Instructions (Signed)
Continue to monitor your blood sugars as we discussed.  Take your medication as directed.    Understanding your Hemoglobin A1c:     Diabetes Mellitus and Nutrition When you have diabetes (diabetes mellitus), it is very important to have healthy eating habits because your blood sugar (glucose) levels are greatly affected by what you eat and drink. Eating healthy foods in the appropriate amounts, at about the same times every day, can help you: Control your blood glucose. Lower your risk of heart disease. Improve your blood pressure. Reach or maintain a healthy weight.  Every person with diabetes is different, and each person has different needs for a meal plan. Your health care provider may recommend that you work with a diet and nutrition specialist (dietitian) to make a meal plan that is best for you. Your meal plan may vary depending on factors such as: The calories you need. The medicines you take. Your weight. Your blood glucose, blood pressure, and cholesterol levels. Your activity level. Other health conditions you have, such as heart or kidney disease.  How do carbohydrates affect me? Carbohydrates affect your blood glucose level more than any other type of food. Eating carbohydrates naturally increases the amount of glucose in your blood. Carbohydrate counting is a method for keeping track of how many carbohydrates you eat. Counting carbohydrates is important to keep your blood glucose at a healthy level, especially if you use insulin or take certain oral diabetes medicines. It is important to know how many carbohydrates you can safely have in each meal. This is different for every person. Your dietitian can help you calculate how many carbohydrates you should have at each meal and for snack. Foods that contain carbohydrates include: Bread, cereal, rice, pasta, and crackers. Potatoes and corn. Peas, beans, and lentils. Milk and yogurt. Fruit and juice. Desserts, such as cakes,  cookies, ice cream, and candy.  How does alcohol affect me? Alcohol can cause a sudden decrease in blood glucose (hypoglycemia), especially if you use insulin or take certain oral diabetes medicines. Hypoglycemia can be a life-threatening condition. Symptoms of hypoglycemia (sleepiness, dizziness, and confusion) are similar to symptoms of having too much alcohol. If your health care provider says that alcohol is safe for you, follow these guidelines: Limit alcohol intake to no more than 1 drink per day for nonpregnant women and 2 drinks per day for men. One drink equals 12 oz of beer, 5 oz of wine, or 1 oz of hard liquor. Do not drink on an empty stomach. Keep yourself hydrated with water, diet soda, or unsweetened iced tea. Keep in mind that regular soda, juice, and other mixers may contain a lot of sugar and must be counted as carbohydrates.  What are tips for following this plan? Reading food labels Start by checking the serving size on the label. The amount of calories, carbohydrates, fats, and other nutrients listed on the label are based on one serving of the food. Many foods contain more than one serving per package. Check the total grams (g) of carbohydrates in one serving. You can calculate the number of servings of carbohydrates in one serving by dividing the total carbohydrates by 15. For example, if a food has 30 g of total carbohydrates, it would be equal to 2 servings of carbohydrates. Check the number of grams (g) of saturated and trans fats in one serving. Choose foods that have low or no amount of these fats. Check the number of milligrams (mg) of sodium in one serving.  Most people should limit total sodium intake to less than 2,300 mg per day. Always check the nutrition information of foods labeled as "low-fat" or "nonfat". These foods may be higher in added sugar or refined carbohydrates and should be avoided. Talk to your dietitian to identify your daily goals for nutrients  listed on the label. Shopping Avoid buying canned, premade, or processed foods. These foods tend to be high in fat, sodium, and added sugar. Shop around the outside edge of the grocery store. This includes fresh fruits and vegetables, bulk grains, fresh meats, and fresh dairy. Cooking Use low-heat cooking methods, such as baking, instead of high-heat cooking methods like deep frying. Cook using healthy oils, such as olive, canola, or sunflower oil. Avoid cooking with butter, cream, or high-fat meats. Meal planning Eat meals and snacks regularly, preferably at the same times every day. Avoid going long periods of time without eating. Eat foods high in fiber, such as fresh fruits, vegetables, beans, and whole grains. Talk to your dietitian about how many servings of carbohydrates you can eat at each meal. Eat 4-6 ounces of lean protein each day, such as lean meat, chicken, fish, eggs, or tofu. 1 ounce is equal to 1 ounce of meat, chicken, or fish, 1 egg, or 1/4 cup of tofu. Eat some foods each day that contain healthy fats, such as avocado, nuts, seeds, and fish. Lifestyle  Check your blood glucose regularly. Exercise at least 30 minutes 5 or more days each week, or as told by your health care provider. Take medicines as told by your health care provider. Do not use any products that contain nicotine or tobacco, such as cigarettes and e-cigarettes. If you need help quitting, ask your health care provider. Work with a Social worker or diabetes educator to identify strategies to manage stress and any emotional and social challenges. What are some questions to ask my health care provider? Do I need to meet with a diabetes educator? Do I need to meet with a dietitian? What number can I call if I have questions? When are the best times to check my blood glucose? Where to find more information: American Diabetes Association: diabetes.org/food-and-fitness/food Academy of Nutrition and Dietetics:  PokerClues.dk [employer] of Diabetes and Digestive and Kidney Diseases (NIH): ContactWire.be Summary A healthy meal plan will help you control your blood glucose and maintain a healthy lifestyle. Working with a diet and nutrition specialist (dietitian) can help you make a meal plan that is best for you. Keep in mind that carbohydrates and alcohol have immediate effects on your blood glucose levels. It is important to count carbohydrates and to use alcohol carefully. This information is not intended to replace advice given to you by your health care provider. Make sure you discuss any questions you have with your health care provider. Document Released: 11/06/2004 Document Revised: 03/16/2016 Document Reviewed: 03/16/2016 Elsevier Interactive Patient Education  [employer].

## 2021-09-16 ENCOUNTER — Telehealth: Payer: Self-pay | Admitting: Family Medicine

## 2021-09-16 DIAGNOSIS — E119 Type 2 diabetes mellitus without complications: Secondary | ICD-10-CM

## 2021-09-16 LAB — CMP14+EGFR
ALT: 52 IU/L — ABNORMAL HIGH (ref 0–44)
AST: 34 IU/L (ref 0–40)
Albumin/Globulin Ratio: 2 (ref 1.2–2.2)
Albumin: 4.6 g/dL (ref 3.8–4.8)
Alkaline Phosphatase: 155 IU/L — ABNORMAL HIGH (ref 44–121)
BUN/Creatinine Ratio: 19 (ref 10–24)
BUN: 23 mg/dL (ref 8–27)
Bilirubin Total: 1.4 mg/dL — ABNORMAL HIGH (ref 0.0–1.2)
CO2: 25 mmol/L (ref 20–29)
Calcium: 10.1 mg/dL (ref 8.6–10.2)
Chloride: 89 mmol/L — ABNORMAL LOW (ref 96–106)
Creatinine, Ser: 1.23 mg/dL (ref 0.76–1.27)
Globulin, Total: 2.3 g/dL (ref 1.5–4.5)
Glucose: 684 mg/dL (ref 70–99)
Potassium: 5.9 mmol/L (ref 3.5–5.2)
Sodium: 129 mmol/L — ABNORMAL LOW (ref 134–144)
Total Protein: 6.9 g/dL (ref 6.0–8.5)
eGFR: 60 mL/min/{1.73_m2} (ref >59)

## 2021-09-16 LAB — MICROALBUMIN / CREATININE URINE RATIO
Creatinine, Urine: 32.6 mg/dL
Microalb/Creat Ratio: 22 mg/g creat (ref 0–29)
Microalbumin, Urine: 7.3 ug/mL

## 2021-09-16 NOTE — Telephone Encounter (Signed)
Pts son called stating that pt was given 1 unit of Insulin around 3 PM today. Wants to know if he is still supposed to take 12 units tonight?

## 2021-09-16 NOTE — Telephone Encounter (Signed)
Sugar 404- around 3pm  Pt son went to give dad an injection and states he is unsure how much was given- he forgot to turn the dial and thinks he was given 1 unit but isnt 100% sure.

## 2021-09-16 NOTE — Telephone Encounter (Signed)
[employer] called with critical lab result.  Glucose: 684 Potassium: 5.9

## 2021-09-16 NOTE — Telephone Encounter (Signed)
Already responded to this in the pools.  I could not reach him successfully.  Please try and call him again to check in on him.

## 2021-09-16 NOTE — Telephone Encounter (Signed)
Proceed with 12u tonight as scheduled. Call back in am with am glucose.

## 2021-09-17 ENCOUNTER — Telehealth: Payer: Self-pay

## 2021-09-17 NOTE — Telephone Encounter (Signed)
Proceed with 14 units tonight call me.  If his blood sugar remains over 200 tomorrow morning, then advance him to 16 units tomorrow night.  Continue to call me each morning with sugar logs that we can continue advancing the insulin as appropriate

## 2021-09-17 NOTE — Telephone Encounter (Signed)
Son, Darcella Cheshire made aware and understood. Will call back in the morning with blood sugars.

## 2021-09-17 NOTE — Telephone Encounter (Signed)
Checked last night 8:50 380 This morning at 6 it was 366 I told patient to continue with 12 units tonight for now and we will call if anything changes

## 2021-09-17 NOTE — Chronic Care Management (AMB) (Signed)
  Chronic Care Management   Outreach Note  09/17/2021 Name: Robert Faulkner MRN: [id number] DOB: 1945-08-27  Robert Faulkner is a 76 y.o. year old male who is a primary care patient of Janora Norlander, DO. I reached out to [name] by phone today in response to a referral sent by Mr. Ab Leaming primary care provider.  An unsuccessful telephone outreach was attempted today. The patient was referred to the case management team for assistance with care management and care coordination.   Follow Up Plan: A HIPAA compliant phone message was left for the patient providing contact information and requesting a return call.  The care management team will reach out to the patient again over the next 5 days.  If patient returns call to provider office, please advise to call [facility] * at [phone] Larsson, [place], [place] [address] Direct Dial: [phone] [email or web address]

## 2021-09-18 NOTE — Telephone Encounter (Signed)
[place] aware to continue with 16 units tonight

## 2021-09-18 NOTE — Telephone Encounter (Signed)
Pts son called to let PCP know that pt has decided that he wants PCP to refer him to see an Endo Specialist to help with his sugar.   Please advise.

## 2021-09-18 NOTE — Telephone Encounter (Signed)
Routed to Dr. Lajuana Ripple to make aware of pts wishes.

## 2021-09-18 NOTE — Addendum Note (Signed)
Addended by: Alphonzo Dublin on: 09/18/2021 04:47 PM   Modules accepted: Orders

## 2021-09-18 NOTE — Telephone Encounter (Signed)
Sugar level this am is 306

## 2021-09-19 NOTE — Addendum Note (Signed)
Addended by: Janora Norlander on: 09/19/2021 07:20 AM   Modules accepted: Orders

## 2021-09-19 NOTE — Telephone Encounter (Signed)
Referral done

## 2021-09-21 ENCOUNTER — Encounter (HOSPITAL_COMMUNITY): Payer: Self-pay | Admitting: Emergency Medicine

## 2021-09-21 ENCOUNTER — Other Ambulatory Visit: Payer: Self-pay

## 2021-09-21 ENCOUNTER — Emergency Department (HOSPITAL_COMMUNITY)
Admission: EM | Admit: 2021-09-21 | Discharge: 2021-09-21 | Disposition: A | Discharge status: Home / Self Care | Payer: Medicare Other | Attending: Emergency Medicine | Admitting: Emergency Medicine

## 2021-09-21 DIAGNOSIS — E119 Type 2 diabetes mellitus without complications: Secondary | ICD-10-CM | POA: Diagnosis not present

## 2021-09-21 DIAGNOSIS — I10 Essential (primary) hypertension: Secondary | ICD-10-CM | POA: Insufficient documentation

## 2021-09-21 DIAGNOSIS — Z79899 Other long term (current) drug therapy: Secondary | ICD-10-CM | POA: Insufficient documentation

## 2021-09-21 DIAGNOSIS — K5900 Constipation, unspecified: Secondary | ICD-10-CM | POA: Diagnosis not present

## 2021-09-21 DIAGNOSIS — Z794 Long term (current) use of insulin: Secondary | ICD-10-CM | POA: Insufficient documentation

## 2021-09-21 HISTORY — DX: Type 2 diabetes mellitus without complications: E11.9

## 2021-09-21 LAB — BASIC METABOLIC PANEL
Anion gap: 8 (ref 5–15)
BUN: 26 mg/dL — ABNORMAL HIGH (ref 8–23)
CO2: 27 mmol/L (ref 22–32)
Calcium: 9.1 mg/dL (ref 8.9–10.3)
Chloride: 100 mmol/L (ref 98–111)
Creatinine, Ser: 1.03 mg/dL (ref 0.61–1.24)
GFR, Estimated: >60 mL/min (ref >60)
Glucose, Bld: 165 mg/dL — ABNORMAL HIGH (ref 70–99)
Potassium: 4.6 mmol/L (ref 3.5–5.1)
Sodium: 135 mmol/L (ref 135–145)

## 2021-09-21 MED ORDER — MAGNESIUM CITRATE PO SOLN: 1.0000 | Freq: Once | ORAL | 0 refills | Status: AC

## 2021-09-21 MED ORDER — LACTATED RINGERS IV BOLUS
1000.0000 mL | Freq: Once | INTRAVENOUS | Status: AC
  Administered 2021-09-21: 1000 mL via INTRAVENOUS

## 2021-09-21 NOTE — ED Provider Notes (Signed)
[facility] EMERGENCY DEPARTMENT Provider Note   CSN: [id number] Arrival date & time: 09/21/21  1400     History  Chief Complaint  Patient presents with   Constipation    Robert Faulkner is a 76 y.o. male with history of hypertension, hyperlipidemia and new diagnosis of diabetes 6 days ago starting [place] who presents to the emergency department for evaluation of constipation x6 days.  Patient states that he feels some bloating but he denies abdominal pain.  He denies nausea and vomiting.  He states that for 3 days, he used over-the-counter stool softeners.  He did an enema 3 days ago along with sugar-free prune juice and then 1 dose of MiraLAX yesterday with no bowel movement.  Patient states that he is usually regular and has a bowel movement every day after his morning oatmeal.  He denies fever, chills, chest pain, shortness of breath.   Constipation Associated symptoms: no abdominal pain, no dysuria, no fever, no nausea and no vomiting        Home Medications Prior to Admission medications   Medication Sig Start Date End Date Taking? Authorizing Provider  magnesium citrate SOLN Take 296 mLs (1 Bottle total) by mouth once for 1 dose. 09/21/21 09/21/21 Yes Tonye Pearson, PA-C  doxylamine, Sleep, (UNISOM) 25 MG tablet Take 25 mg by mouth at bedtime as needed.    [provider]  insulin degludec (TRESIBA FLEXTOUCH) 100 UNIT/ML FlexTouch Pen Inject 10 Units into the skin at bedtime. 09/15/21   Janora Norlander, DO  propranolol (INDERAL) 10 MG tablet Take 1 tablet (10 mg total) by mouth 3 (three) times daily. (NEEDS TO BE SEEN BEFORE NEXT REFILL) 07/10/21   Ronnie Doss M, DO  rosuvastatin (CRESTOR) 10 MG tablet TAKE ONE (1) TABLET BY MOUTH EVERY DAY 05/28/21   Ronnie Doss M, DO      Allergies    Patient has no known allergies.    Review of Systems   Review of Systems  Constitutional:  Negative for fever.  Respiratory:  Negative for shortness of breath.    Cardiovascular:  Negative for chest pain.  Gastrointestinal:  Positive for constipation. Negative for abdominal pain, nausea and vomiting.  Genitourinary:  Negative for dysuria.    Physical Exam Updated Vital Signs BP 135/71 (BP Location: Right Arm)   Pulse 65   Temp 97.7 F (36.5 C) (Oral)   Resp 20   Ht '5\' 11"'$  (1.803 m)   Wt 93.9 kg   SpO2 99%   BMI 28.87 kg/m  Physical Exam Vitals and nursing note reviewed.  Constitutional:      General: He is not in acute distress.    Appearance: He is not ill-appearing.  HENT:     Head: Atraumatic.  Eyes:     Conjunctiva/sclera: Conjunctivae normal.  Cardiovascular:     Rate and Rhythm: Normal rate and regular rhythm.     Pulses: Normal pulses.     Heart sounds: No murmur heard. Pulmonary:     Effort: Pulmonary effort is normal. No respiratory distress.     Breath sounds: Normal breath sounds.  Abdominal:     General: Abdomen is flat. There is no distension.     Palpations: Abdomen is soft.     Tenderness: There is no abdominal tenderness.     Comments: Abdomen is soft, not obviously tender to palpation.  Genitourinary:    Rectum: Normal.     Comments: No fecal impaction on DRE Musculoskeletal:  General: Normal range of motion.     Cervical back: Normal range of motion.  Skin:    General: Skin is warm and dry.     Capillary Refill: Capillary refill takes less than 2 seconds.  Neurological:     General: No focal deficit present.     Mental Status: He is alert.  Psychiatric:        Mood and Affect: Mood normal.     ED Results / Procedures / Treatments   Labs (all labs ordered are listed, but only abnormal results are displayed) Labs Reviewed  BASIC METABOLIC PANEL - Abnormal; Notable for the following components:      Result Value   Glucose, Bld 165 (*)    BUN 26 (*)    All other components within normal limits    EKG None  Radiology No results found.  Procedures Procedures    Medications Ordered  in ED Medications  lactated ringers bolus 1,000 mL (1,000 mLs Intravenous New Bag/Given 09/21/21 1511)    ED Course/ Medical Decision Making/ A&P                           Medical Decision Making Amount and/or Complexity of Data Reviewed Labs: ordered.  Risk OTC drugs.   76 year old male presents to the emergency department for evaluation of abdominal pain x6 days.  Differentials include medication side effect, constipation due to dehydration, SBO or fecal impaction.  Vitals without significant abnormality.  He is overall well-appearing and is pleasant and in no acute distress.  Abdomen is soft, nondistended and not obviously tender to palpation.  No fecal impaction noted on DRE.  Discussed case with my attending, Dr. Sabra Heck, who also evaluated patient and agrees that he does not appear to have an emergent process.  He recommends fluid hydration with 1 L with mag citrate. I ordered labs and BMP was normal. Unfortunately, we do not have mag citrate in the facility, however I did look up the nearest [facility] and it does have it in stock.  Plan to give 1 bag of fluids and have patient pick up mag citrate on his way home.  He will try the entire bottle and then he can do MiraLAX 3 times daily until he is having regular bowel movements and then taper back down.  Patient and son are amenable to plan.  Discharged home in stable condition. Final Clinical Impression(s) / ED Diagnoses Final diagnoses:  Constipation, unspecified constipation type    Rx / DC Orders ED Discharge Orders          Ordered    magnesium citrate SOLN   Once        09/21/21 1449              Tonye Pearson, PA-C 09/21/21 1549    Noemi Chapel, MD 09/22/21 [phone]

## 2021-09-21 NOTE — ED Triage Notes (Signed)
Patient c/o constipation x6 days. Per patient normally goes daily. Patient reports bloated feeling with some abd pain. Denies any nausea or vomiting. Per patient he used stool softners Tuesday-Thursday, Enema on Thursday, sugar free prune juice on Friday, and Mirilax on Saturday with no results.

## 2021-09-21 NOTE — Discharge Instructions (Signed)
Your labs are normal today.  We did give you a bolus of fluids here which can sometimes help with constipation.  When you leave, please go to the [facility] here in [place] and pick up a bottle of magnesium citrate.  Please drink the entire bottle and you will usually produce a bowel movement rather quickly.  Please also take MiraLAX 3 times a day until you are having regular bowel movements again and then begin tapering it down.  If you are still not having a bowel movement, feel free to return to the emergency department.

## 2021-09-22 NOTE — Chronic Care Management (AMB) (Signed)
  Chronic Care Management   Note  09/22/2021 Name: Mahir Prabhakar MRN: [id number] DOB: [birth year]  Bernadette Armijo is a 76 y.o. year old male who is a primary care patient of Janora Norlander, DO. I reached out to [name] by phone today in response to a referral sent by Mr. Taesean Reth PCP.  Mr. Vuncannon was given information about Chronic Care Management services today including:  CCM service includes personalized support from designated clinical staff supervised by his physician, including individualized plan of care and coordination with other care providers 24/7 contact phone numbers for assistance for urgent and routine care needs. Service will only be billed when office clinical staff spend 20 minutes or more in a month to coordinate care. Only one practitioner may furnish and bill the service in a calendar month. The patient may stop CCM services at any time (effective at the end of the month) by phone call to the office staff. The patient is responsible for co-pay (up to 20% after annual deductible is met) if co-pay is required by the individual health plan.   Patient agreed to services and verbal consent obtained.   Follow up plan: Telephone appointment with care management team member scheduled for:10/02/2021  Noreene Larsson, [place], [place] [address] Direct Dial: [phone] [email or web address]

## 2021-09-23 ENCOUNTER — Telehealth: Payer: Self-pay | Admitting: Family Medicine

## 2021-09-23 ENCOUNTER — Other Ambulatory Visit: Payer: Self-pay

## 2021-09-23 DIAGNOSIS — E119 Type 2 diabetes mellitus without complications: Secondary | ICD-10-CM

## 2021-09-23 MED ORDER — INSULIN PEN NEEDLE 32G X 6 MM MISC: 3 refills | Status: DC

## 2021-09-23 MED ORDER — TRESIBA FLEXTOUCH 100 UNIT/ML ~~LOC~~ SOPN: 16.0000 [IU] | PEN_INJECTOR | Freq: Every day | SUBCUTANEOUS | 0 refills | Status: DC

## 2021-09-23 MED ORDER — INSULIN DEGLUDEC 100 UNIT/ML ~~LOC~~ SOPN: 100.0000 [IU] | PEN_INJECTOR | Freq: Every day | SUBCUTANEOUS | Status: DC

## 2021-09-23 NOTE — Telephone Encounter (Signed)
meDs sent

## 2021-09-23 NOTE — Telephone Encounter (Signed)
done

## 2021-09-23 NOTE — Telephone Encounter (Signed)
Rx sent 

## 2021-09-23 NOTE — Telephone Encounter (Signed)
Patient aware and verbalized understanding. °

## 2021-10-01 ENCOUNTER — Other Ambulatory Visit: Payer: Self-pay | Admitting: Family Medicine

## 2021-10-01 ENCOUNTER — Ambulatory Visit (INDEPENDENT_AMBULATORY_CARE_PROVIDER_SITE_OTHER): Payer: Medicare Other | Admitting: Family Medicine

## 2021-10-01 ENCOUNTER — Encounter: Payer: Self-pay | Admitting: Family Medicine

## 2021-10-01 VITALS — BP 110/54 | HR 57 | Temp 97.2°F | Ht 71.0 in | Wt 210.6 lb

## 2021-10-01 DIAGNOSIS — E1159 Type 2 diabetes mellitus with other circulatory complications: Secondary | ICD-10-CM

## 2021-10-01 DIAGNOSIS — E1169 Type 2 diabetes mellitus with other specified complication: Secondary | ICD-10-CM | POA: Diagnosis not present

## 2021-10-01 DIAGNOSIS — I152 Hypertension secondary to endocrine disorders: Secondary | ICD-10-CM

## 2021-10-01 DIAGNOSIS — G250 Essential tremor: Secondary | ICD-10-CM | POA: Diagnosis not present

## 2021-10-01 DIAGNOSIS — E119 Type 2 diabetes mellitus without complications: Secondary | ICD-10-CM

## 2021-10-01 DIAGNOSIS — E785 Hyperlipidemia, unspecified: Secondary | ICD-10-CM

## 2021-10-01 MED ORDER — PROPRANOLOL HCL 10 MG PO TABS: 10.0000 mg | ORAL_TABLET | Freq: Three times a day (TID) | ORAL | 3 refills | Status: DC

## 2021-10-01 NOTE — Progress Notes (Signed)
Subjective: CC:DM PCP: Janora Norlander, DO DGL:OVFIEPP Robert Faulkner is a 76 y.o. male who is accompanied today's visit by his son.  He is presenting to clinic today for:  1. Type 2 Diabetes with hypertension, hyperlipidemia:  Patient here for interval follow-up on new onset type 2 diabetes.  This was diagnosed in July 2023.  He was started on basal insulin given blood sugars well over 600.  He is up to 16 units of Tresiba nightly and is doing well on this.  Average blood sugars are running typically in the 110s.  Highest blood sugar that he is seen as of late is in the 190s and lowest 75.  He has really cut out carbohydrates and only eats foods that are rich in fiber if he does consume carbohydrates.  He reports that thirst and urination have improved substantially and are back to his baseline.  Vision has been good.  He denies any sensory changes in the feet.  No ulcers or other abnormalities in the feet.  He has an appointment to establish care with endocrinology on September 11, which was requested by his son.  Last eye exam: Needs Last foot exam: Needs Last A1c:  Lab Results  Component Value Date   HGBA1C 10.6 (H) 09/15/2021   Nephropathy screen indicated?:  Up-to-date Last flu, zoster and/or pneumovax:  Immunization History  Administered Date(s) Administered   Fluad Quad(high Dose 65+) 12/28/2018, 11/20/2019, 11/26/2020   Influenza, High Dose Seasonal PF 11/25/2015, 12/21/2016   Influenza-Unspecified 12/04/2017   Moderna SARS-COV2 Booster Vaccination 07/24/2020   Moderna Sars-Covid-2 Vaccination 05/01/2019, 05/29/2019, 12/25/2019, 07/24/2020, 12/07/2020   Pneumococcal Conjugate-13 09/09/2016   Pneumococcal Polysaccharide-23 04/21/2018    ROS: No chest pain, shortness of breath, dizziness, visual disturbance    ROS: Per HPI  No Known Allergies Past Medical History:  Diagnosis Date   Diabetes mellitus without complication ([place])    Essential tremor    Hypertension     Tremors of nervous system     Current Outpatient Medications:    doxylamine, Sleep, (UNISOM) 25 MG tablet, Take 25 mg by mouth at bedtime as needed., Disp: , Rfl:    insulin degludec (TRESIBA FLEXTOUCH) 100 UNIT/ML FlexTouch Pen, Inject 16 Units into the skin at bedtime., Disp: 15 mL, Rfl: 0   Insulin Pen Needle 32G X 6 MM MISC, UAD with saxenda, Disp: 100 each, Rfl: 3   propranolol (INDERAL) 10 MG tablet, Take 1 tablet (10 mg total) by mouth 3 (three) times daily. (NEEDS TO BE SEEN BEFORE NEXT REFILL), Disp: 90 tablet, Rfl: 0   rosuvastatin (CRESTOR) 10 MG tablet, TAKE ONE (1) TABLET BY MOUTH EVERY DAY, Disp: 90 tablet, Rfl: 1 Social History   Socioeconomic History   Marital status: Widowed    Spouse name: Not on file   Number of children: 3   Years of education: Not on file   Highest education level: 8th grade  Occupational History   Occupation: retired    Comment: [employer]  Tobacco Use   Smoking status: Former    Packs/day: 2.00    Years: 11.00    Total pack years: 22.00    Types: Cigarettes    Quit date: 09/20/1977    Years since quitting: 44.0   Smokeless tobacco: Never  Vaping Use   Vaping Use: Never used  Substance and Sexual Activity   Alcohol use: No   Drug use: No   Sexual activity: Not on file  Other Topics Concern  Not on file  Social History Narrative   Lives in 2 story home alone - wife passed in 2022 - his children live nearby and visit often   He's involved in church   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, cuts wood, always working)   Social Determinants of Health   Financial Resource Strain: [place]  (05/02/2021)   Overall Financial Resource Strain (CARDIA)    Difficulty of Paying Living Expenses: Not hard at all  Food Insecurity: No [place] (05/02/2021)   Hunger Vital Sign    Worried About Running Out of Food in the Last Year: Never true    [place] in the Last Year: Never true  Transportation Needs:  No Transportation Needs (05/02/2021)   PRAPARE - Hydrologist (Medical): No    Lack of Transportation (Non-Medical): No  Physical Activity: Sufficiently Active (05/02/2021)   Exercise Vital Sign    Days of Exercise per Week: 7 days    Minutes of Exercise per Session: 40 min  Stress: No Stress Concern Present (05/02/2021)   [place]    Feeling of Stress : Not at all  Social Connections: Moderately Integrated (05/02/2021)   Social Connection and Isolation Panel [NHANES]    Frequency of Communication with Friends and Family: Three times a week    Frequency of Social Gatherings with Friends and Family: More than three times a week    Attends Religious Services: More than 4 times per year    Active Member of Clubs or Organizations: Yes    Attends Archivist Meetings: More than 4 times per year    Marital Status: Widowed  Intimate Partner Violence: Not At Risk (05/02/2021)   Humiliation, Afraid, Rape, and Kick questionnaire    Fear of Current or Ex-Partner: No    Emotionally Abused: No    Physically Abused: No    Sexually Abused: No   Family History  Problem Relation Age of Onset   Stroke Mother    Stroke Father    Arthritis Son    Hyperlipidemia Son    Hypertension Son    Colon cancer Neg Hx     Objective: Office vital signs reviewed. BP (!) 110/54   Pulse (!) 57   Temp (!) 97.2 F (36.2 C)   Ht '5\' 11"'$  (1.803 m)   Wt 210 lb 9.6 oz (95.5 kg)   SpO2 90%   BMI 29.37 kg/m   Physical Examination:  General: Awake, alert, well nourished, well appearing elderly male. No acute distress HEENT: Sclera white.  Moist mucous membranes Cardio: Slightly bradycardic with regular rhythm, S1S2 heard, no murmurs appreciated Pulm: clear to auscultation bilaterally, no wheezes, rhonchi or rales; normal work of breathing on room air Neuro: see DM foot  Diabetic Foot Exam - Simple   Simple  Foot Form Diabetic Foot exam was performed with the following findings: Yes 10/01/2021  2:09 PM  Visual Inspection No deformities, no ulcerations, no other skin breakdown bilaterally: Yes Sensation Testing See comments: Yes Pulse Check Posterior Tibialis and Dorsalis pulse intact bilaterally: Yes Comments Absent monofilament sensation bilaterally on digits 3 and 4.  Otherwise monofilament testing is present throughout.  No calluses appreciated      Assessment/ Plan: 76 y.o. male   New onset type 2 diabetes mellitus ([place])  Hyperlipidemia associated with type 2 diabetes mellitus ([place])  Hypertension associated with diabetes ([place]) - Plan: propranolol (INDERAL)  10 MG tablet  Tremor, essential - Plan: propranolol (INDERAL) 10 MG tablet  A1c will be due in the next 2 months.  He has an appointment scheduled endocrinology.  It sounds like his A1c should be back to an appropriate range as his home blood sugars are typically running in the 110s to 120s with only a few outliers in the upper 100s.  I anticipate A1c should be closer to around a 7 by next checkup.  We discussed potential transition over to orals if he desires at that new patient visit with endocrinology.  Diabetic foot exam was performed today.  We will set him up with [facility] for diabetic retinal exam.  We discussed the importance of yearly foot and eye exams today.  Continue diet modification as directed and I have instructed them to contact me with any blood sugars less than 70.  Blood pressure/tremor remains well-controlled with 3 times daily dosing of propranolol  No orders of the defined types were placed in this encounter.  No orders of the defined types were placed in this encounter.    Janora Norlander, DO [place] [phone]

## 2021-10-02 ENCOUNTER — Ambulatory Visit: Payer: Medicare Other | Admitting: Pharmacist

## 2021-10-02 DIAGNOSIS — E119 Type 2 diabetes mellitus without complications: Secondary | ICD-10-CM

## 2021-10-02 NOTE — Progress Notes (Signed)
    10/02/2021 Name: Robert Faulkner MRN: [id number] DOB: 1945/10/19   S: 76 yoM Presents for new onset type 2 diabetes evaluation, education, and management.  Patient was started on basal insulin due to blood sugars over 600  Patient reports Diabetes was diagnosed in July 2023.  Insurance coverage/medication affordability: BCBS  Patient reports adherence with medications. Current diabetes medications include: tresiba 16 units Current hypertension medications include: propranolol Goal 130/80 Current hyperlipidemia medications include: rosuvastatin   Patient denies hypoglycemic events.   Patient reported dietary habits: Eats 3 meals/day  Breakfast -scrambled eggs -[place] bacon -tomato -unsweetened tea  Lunch -half sweet pot -salad -pintos -sweet tea -turnip greens   Dinner  -similar to lunch  Patient-reported exercise habits: n/a  O:  Lab Results  Component Value Date   HGBA1C 10.6 (H) 09/15/2021   Lipid Panel     Component Value Date/Time   CHOL 241 (H) 11/26/2020 1036   TRIG 277 (H) 11/26/2020 1036   HDL 39 (L) 11/26/2020 1036   CHOLHDL 6.2 (H) 11/26/2020 1036   LDLCALC 151 (H) 11/26/2020 1036   Home fasting blood sugars: <130  2 hour post-meal/random blood sugars: <190.    Clinical Atherosclerotic Cardiovascular Disease (ASCVD): No   The 10-year ASCVD risk score (Arnett DK, et al., 2019) is: 49.1%   Values used to calculate the score:     Age: 76 years     Sex: Male     Is Non-Hispanic African American: No     Diabetic: Yes     Tobacco smoker: No     Systolic Blood Pressure: 269 mmHg     Is BP treated: Yes     HDL Cholesterol: 39 mg/dL     Total Cholesterol: 241 mg/dL    A/P:  Diabetes T2DM currently controlled.  Patient is managing diet and lifestyle.  Patient is able to verbalize appropriate hypoglycemia management plan. Patient is adherent with medication. Would continue current management.  May be able to discontinue insulin in the  future and transition to PO options pending repeat labs and progress.  -Extensively discussed pathophysiology of diabetes, recommended lifestyle interventions, dietary effects on blood sugar control  -Counseled on s/sx of and management of hypoglycemia  -Next A1C anticipated 11/2021.   Written patient instructions provided.  Total time in face to face counseling 25 minutes.     Regina Eck, PharmD, BCPS Clinical Pharmacist, [place]  II Phone [phone]

## 2021-10-13 LAB — HM DIABETES EYE EXAM

## 2021-11-03 ENCOUNTER — Ambulatory Visit: Payer: Medicare Other | Admitting: Nurse Practitioner

## 2021-11-03 ENCOUNTER — Encounter: Payer: Self-pay | Admitting: Nurse Practitioner

## 2021-11-03 VITALS — BP 123/65 | HR 56 | Ht 71.0 in | Wt 214.2 lb

## 2021-11-03 DIAGNOSIS — E119 Type 2 diabetes mellitus without complications: Secondary | ICD-10-CM

## 2021-11-03 MED ORDER — TRESIBA FLEXTOUCH 100 UNIT/ML ~~LOC~~ SOPN
12.0000 [IU] | PEN_INJECTOR | Freq: Every day | SUBCUTANEOUS | 0 refills | Status: DC
Start: 2021-11-03 — End: 2021-12-15

## 2021-11-03 NOTE — Progress Notes (Signed)
Endocrinology Consult Note       11/03/2021, 12:49 PM   Subjective:    Patient ID: Robert Faulkner, male    DOB: 04-14-1945.  Robert Faulkner is being seen in consultation for management of currently uncontrolled symptomatic diabetes requested by  Janora Norlander, DO.   Past Medical History:  Diagnosis Date   Diabetes mellitus without complication ([place])    Essential tremor    Hypertension    Tremors of nervous system     Past Surgical History:  Procedure Laterality Date   COLONOSCOPY N/A 09/21/2014   Procedure: COLONOSCOPY;  Surgeon: Danie Binder, MD;  Location: [facility];  Service: Endoscopy;  Laterality: N/A;  1200 - moved to 10:30 - office to notify    Social History   Socioeconomic History   Marital status: Widowed    Spouse name: Not on file   Number of children: 3   Years of education: Not on file   Highest education level: 8th grade  Occupational History   Occupation: retired    Comment: [employer]  Tobacco Use   Smoking status: Former    Packs/day: 2.00    Years: 11.00    Total pack years: 22.00    Types: Cigarettes    Quit date: 09/20/1977    Years since quitting: 44.1   Smokeless tobacco: Never  Vaping Use   Vaping Use: Never used  Substance and Sexual Activity   Alcohol use: No   Drug use: No   Sexual activity: Not on file  Other Topics Concern   Not on file  Social History Narrative   Lives in 2 story home alone - wife passed in 2022 - his children live nearby and visit often   He's involved in church   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, cuts wood, always working)   Social Determinants of Health   Financial Resource Strain: [place]  (05/02/2021)   Overall Financial Resource Strain (CARDIA)    Difficulty of Paying Living Expenses: Not hard at all  Food Insecurity: No [place] (05/02/2021)   Hunger Vital Sign    Worried  About Running Out of Food in the Last Year: Never true    [place] in the Last Year: Never true  Transportation Needs: No Transportation Needs (05/02/2021)   PRAPARE - Hydrologist (Medical): No    Lack of Transportation (Non-Medical): No  Physical Activity: Sufficiently Active (05/02/2021)   Exercise Vital Sign    Days of Exercise per Week: 7 days    Minutes of Exercise per Session: 40 min  Stress: No Stress Concern Present (05/02/2021)   [place]    Feeling of Stress : Not at all  Social Connections: Moderately Integrated (05/02/2021)   Social Connection and Isolation Panel [NHANES]    Frequency of Communication with Friends and Family: Three times a week    Frequency of Social Gatherings with Friends and Family: More than three times a week    Attends Religious Services: More than 4 times per year    Active Member of [employer] or Organizations:  Yes    Attends Club or Organization Meetings: More than 4 times per year    Marital Status: Widowed    Family History  Problem Relation Age of Onset   Stroke Mother    Stroke Father    Arthritis Son    Hyperlipidemia Son    Hypertension Son    Colon cancer Neg Hx     Outpatient Encounter Medications as of 11/03/2021  Medication Sig   Insulin Pen Needle 32G X 6 MM MISC UAD with saxenda   propranolol (INDERAL) 10 MG tablet Take 1 tablet (10 mg total) by mouth 3 (three) times daily.   rosuvastatin (CRESTOR) 10 MG tablet TAKE ONE (1) TABLET BY MOUTH EVERY DAY   [DISCONTINUED] insulin degludec (TRESIBA FLEXTOUCH) 100 UNIT/ML FlexTouch Pen Inject 16 Units into the skin at bedtime.   doxylamine, Sleep, (UNISOM) 25 MG tablet Take 25 mg by mouth at bedtime as needed. (Patient not taking: Reported on 11/03/2021)   insulin degludec (TRESIBA FLEXTOUCH) 100 UNIT/ML FlexTouch Pen Inject 12 Units into the skin at bedtime.   No facility-administered  encounter medications on file as of 11/03/2021.    ALLERGIES: No Known Allergies  VACCINATION STATUS: Immunization History  Administered Date(s) Administered   Fluad Quad(high Dose 65+) 12/28/2018, 11/20/2019, 11/26/2020   Influenza, High Dose Seasonal PF 11/25/2015, 12/21/2016   Influenza-Unspecified 12/04/2017   Moderna SARS-COV2 Booster Vaccination 07/24/2020   Moderna Sars-Covid-2 Vaccination 05/01/2019, 05/29/2019, 12/25/2019, 07/24/2020, 12/07/2020   Pneumococcal Conjugate-13 09/09/2016   Pneumococcal Polysaccharide-23 04/21/2018    Diabetes He presents for his initial diabetic visit. He has type 2 diabetes mellitus. The initial diagnosis of diabetes was made 2 months (Diagnosed in July at approx age of 76) ago. His disease course has been improving. Hypoglycemia symptoms include nervousness/anxiousness, sweats and tremors. There are no diabetic associated symptoms. There are no hypoglycemic complications. There are no diabetic complications. Risk factors for coronary artery disease include diabetes mellitus, dyslipidemia, family history, obesity, male sex and hypertension. Current diabetic treatment includes insulin injections. He is compliant with treatment most of the time. His weight is stable. He is following a generally healthy diet. Meal planning includes avoidance of concentrated sweets. He has not had a previous visit with a dietitian. He participates in exercise daily. His home blood glucose trend is decreasing steadily. His breakfast blood glucose range is generally 90-110 mg/dl. His bedtime blood glucose range is generally 110-130 mg/dl. (He presents today for his consultation, accompanied by his son, with his glucose readings showing at goal glycemic profile overall.  His most recent A1c was 10.6% at his time of diagnosis in July.  His son helps him with his insulin injections and his glucose monitoring due to his severe tremors.  He monitors glucose twice daily.  He eats 4-5  small meals per day and drinks mostly unsweet tea and water.  He stays active in his garden.  He is UTD on eye exam, has never seen podiatrist in the past.) An ACE inhibitor/angiotensin II receptor blocker is contraindicated. He does not see a podiatrist.Eye exam is current.     Review of systems  Constitutional: + Minimally fluctuating body weight, current Body mass index is 29.87 kg/m., no fatigue, no subjective hyperthermia, no subjective hypothermia Eyes: no blurry vision, no xerophthalmia ENT: no sore throat, no nodules palpated in throat, no dysphagia/odynophagia, no hoarseness Cardiovascular: no chest pain, no shortness of breath, no palpitations, no leg swelling Respiratory: no cough, no shortness of breath Gastrointestinal: no nausea/vomiting/diarrhea Musculoskeletal: no  muscle/joint aches Skin: no rashes, no hyperemia Neurological: + essential tremor-chronic per patient, no numbness, no tingling, no dizziness Psychiatric: no depression, no anxiety  Objective:     BP 123/65 (BP Location: Right Arm, Patient Position: Sitting, Cuff Size: Large)   Pulse (!) 56   Ht '5\' 11"'$  (1.803 m)   Wt 214 lb 3.2 oz (97.2 kg)   BMI 29.87 kg/m   Wt Readings from Last 3 Encounters:  11/03/21 214 lb 3.2 oz (97.2 kg)  10/01/21 210 lb 9.6 oz (95.5 kg)  09/21/21 207 lb (93.9 kg)     BP Readings from Last 3 Encounters:  11/03/21 123/65  10/01/21 (!) 110/54  09/21/21 135/71     Physical Exam- Limited  Constitutional:  Body mass index is 29.87 kg/m. , not in acute distress, normal state of mind Eyes:  EOMI, no exophthalmos Neck: Supple Cardiovascular: RRR, no murmurs, rubs, or gallops, no edema Respiratory: Adequate breathing efforts, no crackles, rales, rhonchi, or wheezing Musculoskeletal: no gross deformities, strength intact in all four extremities, no gross restriction of joint movements Skin:  no rashes, no hyperemia Neurological: + chronic essential tremor    CMP ( most  recent) CMP     Component Value Date/Time   NA 135 09/21/2021 1449   NA 129 (L) 09/15/2021 1422   K 4.6 09/21/2021 1449   CL 100 09/21/2021 1449   CO2 27 09/21/2021 1449   GLUCOSE 165 (H) 09/21/2021 1449   BUN 26 (H) 09/21/2021 1449   BUN 23 09/15/2021 1422   CREATININE 1.03 09/21/2021 1449   CALCIUM 9.1 09/21/2021 1449   PROT 6.9 09/15/2021 1422   ALBUMIN 4.6 09/15/2021 1422   AST 34 09/15/2021 1422   ALT 52 (H) 09/15/2021 1422   ALKPHOS 155 (H) 09/15/2021 1422   BILITOT 1.4 (H) 09/15/2021 1422   GFRNONAA >60 09/21/2021 1449   GFRAA 97 08/04/2019 0845     Diabetic Labs (most recent): Lab Results  Component Value Date   HGBA1C 10.6 (H) 09/15/2021   HGBA1C 6.3 (H) 11/26/2020     Lipid Panel ( most recent) Lipid Panel     Component Value Date/Time   CHOL 241 (H) 11/26/2020 1036   TRIG 277 (H) 11/26/2020 1036   HDL 39 (L) 11/26/2020 1036   CHOLHDL 6.2 (H) 11/26/2020 1036   LDLCALC 151 (H) 11/26/2020 1036   LABVLDL 51 (H) 11/26/2020 1036      Lab Results  Component Value Date   TSH 1.940 11/26/2020   TSH 2.660 08/04/2019   TSH 0.277 (L) 01/25/2019           Assessment & Plan:   1) Type 2 diabetes mellitus without complication, without long-term current use of insulin ([facility])  He presents today for his consultation, accompanied by his son, with his glucose readings showing at goal glycemic profile overall.  His most recent A1c was 10.6% at his time of diagnosis in July.  His son helps him with his insulin injections and his glucose monitoring due to his severe tremors.  He monitors glucose twice daily.  He eats 4-5 small meals per day and drinks mostly unsweet tea and water.  He stays active in his garden.  He is UTD on eye exam, has never seen podiatrist in the past.  - Humzah Harty has currently uncontrolled symptomatic type 2 DM since 76 years of age (recently diagnosed), with most recent A1c of 10.6 %.   -Recent labs reviewed.  - I had a long  discussion with him about the progressive nature of diabetes and the pathology behind its complications. -his diabetes is not currently complicated but he remains at a high risk for more acute and chronic complications which include CAD, CVA, CKD, retinopathy, and neuropathy. These are all discussed in detail with him.  The following Lifestyle Medicine recommendations according to [facility]) were discussed and offered to patient and he agrees to start the journey:  A. Whole Foods, Plant-based plate comprising of fruits and vegetables, plant-based proteins, whole-grain carbohydrates was discussed in detail with the patient.   A list for source of those nutrients were also provided to the patient.  Patient will use only water or unsweetened tea for hydration. B.  The need to stay away from risky substances including alcohol, smoking; obtaining 7 to 9 hours of restorative sleep, at least 150 minutes of moderate intensity exercise weekly, the importance of healthy social connections,  and stress reduction techniques were discussed. C.  A full color page of  Calorie density of various food groups per pound showing examples of each food groups was provided to the patient.  - I have counseled him on diet and weight management by adopting a carbohydrate restricted/protein rich diet. Patient is encouraged to switch to unprocessed or minimally processed complex starch and increased protein intake (animal or plant source), fruits, and vegetables. -  he is advised to stick to a routine mealtimes to eat 3 meals a day and avoid unnecessary snacks (to snack only to correct hypoglycemia).   - he acknowledges that there is a room for improvement in his food and drink choices. - Suggestion is made for him to avoid simple carbohydrates from his diet including Cakes, Sweet Desserts, Ice Cream, Soda (diet and regular), Sweet Tea, Candies, Chips, Cookies, Store Bought Juices, Alcohol in  Excess of 1-2 drinks a day, Artificial Sweeteners, Coffee Creamer, and "Sugar-free" Products. This will help patient to have more stable blood glucose profile and potentially avoid unintended weight gain.  - I have approached him with the following individualized plan to manage his diabetes and patient agrees:   -He is advised to lower his Tresiba to 12 units SQ nightly (which son helps him with).  He has a potential of coming off all medications for diabetes and manage it with lifestyle modifications alone.  At the very least, we may look at changing him to an oral agent in the future.  -he is encouraged to continue monitoring blood glucose twice daily, before breakfast and before bed, and to call the clinic if he has readings less than 70 or above 300 for 3 tests in a row.   - he is warned not to take insulin without proper monitoring per orders. - Adjustment parameters are given to him for hypo and hyperglycemia in writing.  - Specific targets for  A1c; LDL, HDL, and Triglycerides were discussed with the patient.  2) Blood Pressure /Hypertension:  his blood pressure is controlled to target.   he is advised to continue his current medications including Propanolol 10 mg p.o. daily with breakfast.  3) Lipids/Hyperlipidemia:    Review of his recent lipid panel from 11/26/20 showed uncontrolled LDL at 151 and elevated triglycerides of 277 .  he is advised to continue Crestor 10 mg daily at bedtime.  Side effects and precautions discussed with him.  4)  Weight/Diet:  his Body mass index is 29.87 kg/m.  -   he is NOT a candidate for weight loss.  Exercise, and detailed carbohydrates information provided  -  detailed on discharge instructions.  5) Chronic Care/Health Maintenance: -he is not on ACEI/ARB and is on Statin medications and is encouraged to initiate and continue to follow up with Ophthalmology, Dentist, Podiatrist at least yearly or according to recommendations, and advised to stay away  from smoking. I have recommended yearly flu vaccine and pneumonia vaccine at least every 5 years; moderate intensity exercise for up to 150 minutes weekly; and sleep for at least 7 hours a day.  - he is advised to maintain close follow up with Janora Norlander, DO for primary care needs, as well as his other providers for optimal and coordinated care.   - Time spent in this patient care: 60 min, of which > 50% was spent in counseling him about his diabetes and the rest reviewing his blood glucose logs, discussing his hypoglycemia and hyperglycemia episodes, reviewing his current and previous labs/studies (including abstraction from other facilities) and medications doses and developing a long term treatment plan based on the latest standards of care/guidelines; and documenting his care.    Please refer to Patient Instructions for Blood Glucose Monitoring and Insulin/Medications Dosing Guide" in media tab for additional information. Please also refer to "Patient Self Inventory" in the Media tab for reviewed elements of pertinent patient history.  Robert Faulkner participated in the discussions, expressed understanding, and voiced agreement with the above plans.  All questions were answered to his satisfaction. he is encouraged to contact clinic should he have any questions or concerns prior to his return visit.     Follow up plan: - Return in about 1 month (around 12/03/2021) for Diabetes F/U, Bring meter and logs.    Rayetta Pigg, [facility] [address] [place], [place] [address] Phone: [phone] Fax: [phone]  11/03/2021, 12:49 PM

## 2021-11-03 NOTE — Patient Instructions (Signed)

## 2021-11-13 ENCOUNTER — Ambulatory Visit (INDEPENDENT_AMBULATORY_CARE_PROVIDER_SITE_OTHER): Payer: Medicare Other | Admitting: Family Medicine

## 2021-11-13 ENCOUNTER — Encounter: Payer: Self-pay | Admitting: Family Medicine

## 2021-11-13 VITALS — BP 125/62 | HR 50 | Temp 97.2°F | Ht 71.0 in | Wt 211.5 lb

## 2021-11-13 DIAGNOSIS — Z23 Encounter for immunization: Secondary | ICD-10-CM

## 2021-11-13 DIAGNOSIS — S91104A Unspecified open wound of right lesser toe(s) without damage to nail, initial encounter: Secondary | ICD-10-CM | POA: Diagnosis not present

## 2021-11-13 NOTE — Patient Instructions (Signed)

## 2021-11-13 NOTE — Progress Notes (Signed)
   Acute Office Visit  Subjective:     Patient ID: Robert Faulkner, male    DOB: November 10, 1945, 76 y.o.   MRN: [id number]  Chief Complaint  Patient presents with   Wound Check    HPI Patient is in today for a wound. He reports a blister on the medial side of his right 4th toe for the last week. This popped on it's own about 6 days ago. He reports clear, watery drainage. Denies pain, fever, warmth, or surrounding erythema. Report no change in size. He has been keeps a loose bandage on the wound and cleaning it with warm soapy water. He is concerned today because it has not improved yet.  ROS As per HPI.      Objective:    BP 125/62   Pulse (!) 50   Temp (!) 97.2 F (36.2 C) (Temporal)   Ht '5\' 11"'$  (1.803 m)   Wt 211 lb 8 oz (95.9 kg)   SpO2 95%   BMI 29.50 kg/m    Physical Exam Vitals and nursing note reviewed.  Pulmonary:     Effort: Pulmonary effort is normal. No respiratory distress.  Skin:    General: Skin is warm and dry.     Findings: Wound present.     Comments: 1 cm x 0.5 cm shallow wound to medial aspect of right 4th toe. Red granulation base. No erythema, tenderness, warmth, or exudate present. Brisk cap refill.   Neurological:     General: No focal deficit present.     Mental Status: He is oriented to person, place, and time.  Psychiatric:        Mood and Affect: Mood normal.        Behavior: Behavior normal.     No results found for any visits on 11/13/21.      Assessment & Plan:   Robert Faulkner was seen today for wound check.  Diagnoses and all orders for this visit:  Open wound of fourth toe, right, initial encounter Shallow, no current signs of infection. Discussed home wound care and return precautions. He is a diabetic, so will have him follow up in 1 week for recheck sooner if needed.   Need for immunization against influenza -     Flu Vaccine QUAD High Dose(Fluad)   Return in about 1 week (around 11/20/2021) for wound recheck.  Gwenlyn Perking, FNP

## 2021-11-20 ENCOUNTER — Encounter: Payer: Self-pay | Admitting: Family Medicine

## 2021-11-20 ENCOUNTER — Ambulatory Visit: Payer: Medicare Other | Admitting: Family Medicine

## 2021-11-20 VITALS — BP 117/65 | HR 56 | Temp 97.1°F | Ht 71.0 in | Wt 211.6 lb

## 2021-11-20 DIAGNOSIS — S91104D Unspecified open wound of right lesser toe(s) without damage to nail, subsequent encounter: Secondary | ICD-10-CM

## 2021-11-20 NOTE — Patient Instructions (Signed)

## 2021-11-20 NOTE — Progress Notes (Signed)
   Acute Office Visit  Subjective:     Patient ID: Robert Faulkner, male    DOB: 07-Jan-1946, 76 y.o.   MRN: [id number]  Chief Complaint  Patient presents with   Wound Check    HPI Patient is in today for a wound recheck. He had a blister that popped on its own on the medial side of his right 4th toe about 2 weeks ago. He reports that it has been healing well. He has been keeping a loose bandage on the wound and cleaning it with warm soapy water. He has been applying OTC antibiotic ointment. Denies pain, fever, warmth, or surrounding erythema. Denies exudate or increase in size.   ROS As per HPI.      Objective:    BP 117/65   Pulse (!) 56   Temp (!) 97.1 F (36.2 C)   Ht '5\' 11"'$  (1.803 m)   Wt 211 lb 9.6 oz (96 kg)   SpO2 97%   BMI 29.51 kg/m   Physical Exam Vitals and nursing note reviewed.  Pulmonary:     Effort: Pulmonary effort is normal. No respiratory distress.  Skin:    General: Skin is warm and dry.     Findings: Wound present.     Comments: 0.25 cm x 0.25 cm shallow wound to medial aspect of right 4th toe. No erythema, tenderness, warmth, or exudate present. Brisk cap refill.   Neurological:     General: No focal deficit present.     Mental Status: He is oriented to person, place, and time.  Psychiatric:        Mood and Affect: Mood normal.        Behavior: Behavior normal.     No results found for any visits on 11/20/21.      Assessment & Plan:   Robert Faulkner was seen today for cough.  Diagnoses and all orders for this visit:  Open wound of fourth toe, right, subsequent encounter Healing well. Continue home wound care. Return to office for new or worsening symptoms, or if symptoms persist.   The patient indicates understanding of these issues and agrees with the plan.  Gwenlyn Perking, FNP

## 2021-12-15 ENCOUNTER — Encounter: Payer: Self-pay | Admitting: Nurse Practitioner

## 2021-12-15 ENCOUNTER — Ambulatory Visit: Payer: Medicare Other | Admitting: Nurse Practitioner

## 2021-12-15 VITALS — BP 127/66 | HR 52 | Ht 71.0 in | Wt 212.0 lb

## 2021-12-15 DIAGNOSIS — E119 Type 2 diabetes mellitus without complications: Secondary | ICD-10-CM | POA: Diagnosis not present

## 2021-12-15 LAB — POCT GLYCOSYLATED HEMOGLOBIN (HGB A1C): Hemoglobin A1C: 5.6 % (ref 4.0–5.6)

## 2021-12-15 MED ORDER — TRESIBA FLEXTOUCH 100 UNIT/ML ~~LOC~~ SOPN
6.0000 [IU] | PEN_INJECTOR | Freq: Every day | SUBCUTANEOUS | 0 refills | Status: DC
Start: 2021-12-15 — End: 2021-12-30

## 2021-12-15 NOTE — Progress Notes (Signed)
Endocrinology Follow Up Note       12/15/2021, 8:51 AM   Subjective:    Patient ID: Robert Faulkner, male    DOB: 01-11-1946.  Robert Faulkner is being seen in follow up after being seen in consultation for management of currently uncontrolled symptomatic diabetes requested by  Janora Norlander, DO.   Past Medical History:  Diagnosis Date   Diabetes mellitus without complication ([place])    Essential tremor    Hypertension    Tremors of nervous system     Past Surgical History:  Procedure Laterality Date   COLONOSCOPY N/A 09/21/2014   Procedure: COLONOSCOPY;  Surgeon: Danie Binder, MD;  Location: [facility];  Service: Endoscopy;  Laterality: N/A;  1200 - moved to 10:30 - office to notify    Social History   Socioeconomic History   Marital status: Widowed    Spouse name: Not on file   Number of children: 3   Years of education: Not on file   Highest education level: 8th grade  Occupational History   Occupation: retired    Comment: [employer]  Tobacco Use   Smoking status: Former    Packs/day: 2.00    Years: 11.00    Total pack years: 22.00    Types: Cigarettes    Quit date: 09/20/1977    Years since quitting: 44.2   Smokeless tobacco: Never  Vaping Use   Vaping Use: Never used  Substance and Sexual Activity   Alcohol use: No   Drug use: No   Sexual activity: Not on file  Other Topics Concern   Not on file  Social History Narrative   Lives in 2 story home alone - wife passed in 2022 - his children live nearby and visit often   He's involved in church   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, cuts wood, always working)   Social Determinants of Health   Financial Resource Strain: [place]  (05/02/2021)   Overall Financial Resource Strain (CARDIA)    Difficulty of Paying Living Expenses: Not hard at all  Food Insecurity: No [place] (05/02/2021)    Hunger Vital Sign    Worried About Running Out of Food in the Last Year: Never true    [place] in the Last Year: Never true  Transportation Needs: No Transportation Needs (05/02/2021)   PRAPARE - Hydrologist (Medical): No    Lack of Transportation (Non-Medical): No  Physical Activity: Sufficiently Active (05/02/2021)   Exercise Vital Sign    Days of Exercise per Week: 7 days    Minutes of Exercise per Session: 40 min  Stress: No Stress Concern Present (05/02/2021)   [place]    Feeling of Stress : Not at all  Social Connections: Moderately Integrated (05/02/2021)   Social Connection and Isolation Panel [NHANES]    Frequency of Communication with Friends and Family: Three times a week    Frequency of Social Gatherings with Friends and Family: More than three times a week    Attends Religious Services: More than 4 times per year  Active Member of Clubs or Organizations: Yes    Attends Archivist Meetings: More than 4 times per year    Marital Status: Widowed    Family History  Problem Relation Age of Onset   Stroke Mother    Stroke Father    Arthritis Son    Hyperlipidemia Son    Hypertension Son    Colon cancer Neg Hx     Outpatient Encounter Medications as of 12/15/2021  Medication Sig   Insulin Pen Needle 32G X 6 MM MISC UAD with saxenda   Multiple Vitamin (ONE-DAILY MULTI-VITAMIN PO) Take by mouth daily.   naproxen sodium (ALEVE) 220 MG tablet Take 220 mg by mouth. Patient states that he takes 2 by mouth every morning and 1 at bedtime   propranolol (INDERAL) 10 MG tablet Take 1 tablet (10 mg total) by mouth 3 (three) times daily.   rosuvastatin (CRESTOR) 10 MG tablet TAKE ONE (1) TABLET BY MOUTH EVERY DAY   [DISCONTINUED] insulin degludec (TRESIBA FLEXTOUCH) 100 UNIT/ML FlexTouch Pen Inject 12 Units into the skin at bedtime.   insulin degludec (TRESIBA  FLEXTOUCH) 100 UNIT/ML FlexTouch Pen Inject 6 Units into the skin at bedtime.   No facility-administered encounter medications on file as of 12/15/2021.    ALLERGIES: No Known Allergies  VACCINATION STATUS: Immunization History  Administered Date(s) Administered   Fluad Quad(high Dose 65+) 12/28/2018, 11/20/2019, 11/26/2020, 11/13/2021   Influenza, High Dose Seasonal PF 11/25/2015, 12/21/2016   Influenza-Unspecified 12/04/2017   Moderna SARS-COV2 Booster Vaccination 07/24/2020   Moderna Sars-Covid-2 Vaccination 05/01/2019, 05/29/2019, 12/25/2019, 07/24/2020, 12/07/2020   Pneumococcal Conjugate-13 09/09/2016   Pneumococcal Polysaccharide-23 04/21/2018    Diabetes He presents for his follow-up diabetic visit. He has type 2 diabetes mellitus. Onset time: Diagnosed in July at approx age of 25. His disease course has been improving. There are no hypoglycemic associated symptoms. There are no diabetic associated symptoms. There are no hypoglycemic complications. There are no diabetic complications. Risk factors for coronary artery disease include diabetes mellitus, dyslipidemia, family history, obesity, male sex and hypertension. Current diabetic treatment includes insulin injections. He is compliant with treatment most of the time. His weight is stable. He is following a generally healthy diet. Meal planning includes avoidance of concentrated sweets. He has not had a previous visit with a dietitian. He participates in exercise daily. His home blood glucose trend is decreasing steadily. His breakfast blood glucose range is generally 70-90 mg/dl. His bedtime blood glucose range is generally 90-110 mg/dl. (He presents today, accompanied by his son, with his logs showing at target glycemic profile overall.  His POCT A1c today is 5.6%, improving drastically from last visit of 10.6% at time of diagnosis.  He has done well avoiding concentrated sweets.  He denies any significant hypoglycemia.) An ACE  inhibitor/angiotensin II receptor blocker is contraindicated. He does not see a podiatrist.Eye exam is current.     Review of systems  Constitutional: + Minimally fluctuating body weight, current Body mass index is 29.57 kg/m., no fatigue, no subjective hyperthermia, no subjective hypothermia Eyes: no blurry vision, no xerophthalmia ENT: no sore throat, no nodules palpated in throat, no dysphagia/odynophagia, no hoarseness Cardiovascular: no chest pain, no shortness of breath, no palpitations, no leg swelling Respiratory: no cough, no shortness of breath Gastrointestinal: no nausea/vomiting/diarrhea Musculoskeletal: no muscle/joint aches Skin: no rashes, no hyperemia Neurological: + essential tremor-chronic per patient, no numbness, no tingling, no dizziness Psychiatric: no depression, no anxiety  Objective:     BP 127/66 (BP  Location: Right Arm, Patient Position: Sitting, Cuff Size: Large)   Pulse (!) 52   Ht '5\' 11"'$  (1.803 m)   Wt 212 lb (96.2 kg)   BMI 29.57 kg/m   Wt Readings from Last 3 Encounters:  12/15/21 212 lb (96.2 kg)  11/20/21 211 lb 9.6 oz (96 kg)  11/13/21 211 lb 8 oz (95.9 kg)     BP Readings from Last 3 Encounters:  12/15/21 127/66  11/20/21 117/65  11/13/21 125/62     Physical Exam- Limited  Constitutional:  Body mass index is 29.57 kg/m. , not in acute distress, normal state of mind Eyes:  EOMI, no exophthalmos Neck: Supple Cardiovascular: RRR, no murmurs, rubs, or gallops, no edema Respiratory: Adequate breathing efforts, no crackles, rales, rhonchi, or wheezing Musculoskeletal: no gross deformities, strength intact in all four extremities, no gross restriction of joint movements Skin:  no rashes, no hyperemia Neurological: + chronic essential tremor    CMP ( most recent) CMP     Component Value Date/Time   NA 135 09/21/2021 1449   NA 129 (L) 09/15/2021 1422   K 4.6 09/21/2021 1449   CL 100 09/21/2021 1449   CO2 27 09/21/2021 1449    GLUCOSE 165 (H) 09/21/2021 1449   BUN 26 (H) 09/21/2021 1449   BUN 23 09/15/2021 1422   CREATININE 1.03 09/21/2021 1449   CALCIUM 9.1 09/21/2021 1449   PROT 6.9 09/15/2021 1422   ALBUMIN 4.6 09/15/2021 1422   AST 34 09/15/2021 1422   ALT 52 (H) 09/15/2021 1422   ALKPHOS 155 (H) 09/15/2021 1422   BILITOT 1.4 (H) 09/15/2021 1422   GFRNONAA >60 09/21/2021 1449   GFRAA 97 08/04/2019 0845     Diabetic Labs (most recent): Lab Results  Component Value Date   HGBA1C 5.6 12/15/2021   HGBA1C 10.6 (H) 09/15/2021   HGBA1C 6.3 (H) 11/26/2020     Lipid Panel ( most recent) Lipid Panel     Component Value Date/Time   CHOL 241 (H) 11/26/2020 1036   TRIG 277 (H) 11/26/2020 1036   HDL 39 (L) 11/26/2020 1036   CHOLHDL 6.2 (H) 11/26/2020 1036   LDLCALC 151 (H) 11/26/2020 1036   LABVLDL 51 (H) 11/26/2020 1036      Lab Results  Component Value Date   TSH 1.940 11/26/2020   TSH 2.660 08/04/2019   TSH 0.277 (L) 01/25/2019           Assessment & Plan:   1) Type 2 diabetes mellitus without complication, without long-term current use of insulin ([place])  He presents today, accompanied by his son, with his logs showing at target glycemic profile overall.  His POCT A1c today is 5.6%, improving drastically from last visit of 10.6% at time of diagnosis.  He has done well avoiding concentrated sweets.  He denies any significant hypoglycemia.  - Robert Faulkner has currently uncontrolled symptomatic type 2 DM since 76 years of age.  -Recent labs reviewed.  - I had a long discussion with him about the progressive nature of diabetes and the pathology behind its complications. -his diabetes is not currently complicated but he remains at a high risk for more acute and chronic complications which include CAD, CVA, CKD, retinopathy, and neuropathy. These are all discussed in detail with him.  The following Lifestyle Medicine recommendations according to [facility]) were discussed and offered to patient and he agrees to start the journey:  A. Whole Foods, Plant-based plate comprising of fruits and  vegetables, plant-based proteins, whole-grain carbohydrates was discussed in detail with the patient.   A list for source of those nutrients were also provided to the patient.  Patient will use only water or unsweetened tea for hydration. B.  The need to stay away from risky substances including alcohol, smoking; obtaining 7 to 9 hours of restorative sleep, at least 150 minutes of moderate intensity exercise weekly, the importance of healthy social connections,  and stress reduction techniques were discussed. C.  A full color page of  Calorie density of various food groups per pound showing examples of each food groups was provided to the patient.  - Nutritional counseling repeated at each appointment due to patients tendency to fall back in to old habits.  - The patient admits there is a room for improvement in their diet and drink choices. -  Suggestion is made for the patient to avoid simple carbohydrates from their diet including Cakes, Sweet Desserts / Pastries, Ice Cream, Soda (diet and regular), Sweet Tea, Candies, Chips, Cookies, Sweet Pastries, Store Bought Juices, Alcohol in Excess of 1-2 drinks a day, Artificial Sweeteners, Coffee Creamer, and "Sugar-free" Products. This will help patient to have stable blood glucose profile and potentially avoid unintended weight gain.   - I encouraged the patient to switch to unprocessed or minimally processed complex starch and increased protein intake (animal or plant source), fruits, and vegetables.   - Patient is advised to stick to a routine mealtimes to eat 3 meals a day and avoid unnecessary snacks (to snack only to correct hypoglycemia).  - I have approached him with the following individualized plan to manage his diabetes and patient agrees:   -He is advised to lower his Tresiba to 6 units SQ nightly (which  son helps him with).  He has a potential of coming off all medications for diabetes and manage it with lifestyle modifications alone.  I advised him to reach out to me with glucose readings in 2 weeks to see if we can stop his insulin altogether.    -At the very least, we may look at changing him to an oral agent in the future.  -he is encouraged to continue monitoring blood glucose twice daily, before breakfast and before bed, and to call the clinic if he has readings less than 70 or above 300 for 3 tests in a row.   - he is warned not to take insulin without proper monitoring per orders. - Adjustment parameters are given to him for hypo and hyperglycemia in writing.  - Specific targets for  A1c; LDL, HDL, and Triglycerides were discussed with the patient.  2) Blood Pressure /Hypertension:  his blood pressure is controlled to target.   he is advised to continue his current medications including Propanolol 10 mg p.o. daily with breakfast.  3) Lipids/Hyperlipidemia:    Review of his recent lipid panel from 11/26/20 showed uncontrolled LDL at 151 and elevated triglycerides of 277 .  he is advised to continue Crestor 10 mg daily at bedtime.  Side effects and precautions discussed with him.  4)  Weight/Diet:  his Body mass index is 29.57 kg/m.  -   he is NOT a candidate for weight loss.  Exercise, and detailed carbohydrates information provided  -  detailed on discharge instructions.  5) Chronic Care/Health Maintenance: -he is not on ACEI/ARB and is on Statin medications and is encouraged to initiate and continue to follow up with Ophthalmology, Dentist, Podiatrist at least yearly or according to recommendations, and  advised to stay away from smoking. I have recommended yearly flu vaccine and pneumonia vaccine at least every 5 years; moderate intensity exercise for up to 150 minutes weekly; and sleep for at least 7 hours a day.  - he is advised to maintain close follow up with Janora Norlander,  DO for primary care needs, as well as his other providers for optimal and coordinated care.     I spent 30 minutes in the care of the patient today including review of labs from [place], Lipids, Thyroid Function, Hematology (current and previous including abstractions from other facilities); face-to-face time discussing  his blood glucose readings/logs, discussing hypoglycemia and hyperglycemia episodes and symptoms, medications doses, his options of short and long term treatment based on the latest standards of care / guidelines;  discussion about incorporating lifestyle medicine;  and documenting the encounter. Risk reduction counseling performed per USPSTF guidelines to reduce obesity and cardiovascular risk factors.     Please refer to Patient Instructions for Blood Glucose Monitoring and Insulin/Medications Dosing Guide"  in media tab for additional information. Please  also refer to " Patient Self Inventory" in the Media  tab for reviewed elements of pertinent patient history.  Robert Faulkner participated in the discussions, expressed understanding, and voiced agreement with the above plans.  All questions were answered to his satisfaction. he is encouraged to contact clinic should he have any questions or concerns prior to his return visit.     Follow up plan: - Return in about 3 months (around 03/17/2022) for Diabetes F/U with A1c in office, No previsit labs, Bring meter and logs.   Rayetta Pigg, [facility] [address] [place], [place] [address] Phone: [phone] Fax: [phone]  12/15/2021, 8:51 AM

## 2021-12-15 NOTE — Patient Instructions (Signed)

## 2021-12-29 ENCOUNTER — Telehealth: Payer: Self-pay | Admitting: Nurse Practitioner

## 2021-12-29 NOTE — Telephone Encounter (Signed)
New message    10/23 morning 83 afternoon  78  10/24 morning 100 afternoon 77  10/25 morning 105 afternoon 104  10/26 morning 103 afternoon  104  10/27 morning 105 afternoon  81  10/28 morning  93 afternoon  75  10/29 morning 98 afternoon  114  10/30 morning 100 afternoon  78  10/31 morning 99 afternoon  86  11/1 morning 109 afternoon  84  11/2 morning  101 afternoon  87  11/3 morning 98 afternoon  78  11/4 morning 115 afternoon  119  11/5 morning  102 afternoon 87  11/6 morning 105 .

## 2021-12-29 NOTE — Telephone Encounter (Signed)
Patient was called and told Whitney's recommendation. He was very appreciative and tearful. He does have appointment tomorrow at 10:30 [place].

## 2021-12-29 NOTE — Telephone Encounter (Signed)
He can stop his insulin altogether at this time!  He is doing wonderfully!

## 2021-12-30 ENCOUNTER — Ambulatory Visit (INDEPENDENT_AMBULATORY_CARE_PROVIDER_SITE_OTHER): Payer: Medicare Other | Admitting: Family Medicine

## 2021-12-30 ENCOUNTER — Encounter: Payer: Self-pay | Admitting: Family Medicine

## 2021-12-30 VITALS — BP 129/74 | HR 51 | Temp 98.1°F | Ht 71.0 in | Wt 209.2 lb

## 2021-12-30 DIAGNOSIS — E1159 Type 2 diabetes mellitus with other circulatory complications: Secondary | ICD-10-CM | POA: Diagnosis not present

## 2021-12-30 DIAGNOSIS — I152 Hypertension secondary to endocrine disorders: Secondary | ICD-10-CM

## 2021-12-30 DIAGNOSIS — E1169 Type 2 diabetes mellitus with other specified complication: Secondary | ICD-10-CM | POA: Diagnosis not present

## 2021-12-30 DIAGNOSIS — E119 Type 2 diabetes mellitus without complications: Secondary | ICD-10-CM | POA: Diagnosis not present

## 2021-12-30 DIAGNOSIS — E785 Hyperlipidemia, unspecified: Secondary | ICD-10-CM | POA: Diagnosis not present

## 2021-12-30 NOTE — Progress Notes (Signed)
Subjective: CC:DM PCP: Janora Norlander, DO XIP:JASNKNL Robert Faulkner is a 76 y.o. male who is accompanied today's visit by his son.  He is presenting to clinic today for:  1.  Type 2 diabetes associate with hypertension and hyperlipidemia Patient is under the care of endocrinology.  He recently had an A1c that was most excellently controlled and he was allowed to discontinue [place].  He is extremely happy about this.  He continues to have a strict diet to reduce blood sugar spikes and continues to blood sugar checked at least twice daily.  No hypo or hyperglycemic episodes reported.  He feels well.  Continues to stay active.  Compliant with propranolol and Crestor.  No chest pain, shortness of breath, polydipsia, polyuria or visual disturbance.   ROS: Per HPI  No Known Allergies Past Medical History:  Diagnosis Date   Diabetes mellitus without complication ([place])    Essential tremor    Hypertension    Tremors of nervous system     Current Outpatient Medications:    Multiple Vitamin (ONE-DAILY MULTI-VITAMIN PO), Take by mouth daily., Disp: , Rfl:    naproxen sodium (ALEVE) 220 MG tablet, Take 220 mg by mouth. Patient states that he takes 2 by mouth every morning and 1 at bedtime, Disp: , Rfl:    propranolol (INDERAL) 10 MG tablet, Take 1 tablet (10 mg total) by mouth 3 (three) times daily., Disp: 270 tablet, Rfl: 3   rosuvastatin (CRESTOR) 10 MG tablet, TAKE ONE (1) TABLET BY MOUTH EVERY DAY, Disp: 90 tablet, Rfl: 0 Social History   Socioeconomic History   Marital status: Widowed    Spouse name: Not on file   Number of children: 3   Years of education: Not on file   Highest education level: 8th grade  Occupational History   Occupation: retired    Comment: [employer]  Tobacco Use   Smoking status: Former    Packs/day: 2.00    Years: 11.00    Total pack years: 22.00    Types: Cigarettes    Quit date: 09/20/1977    Years since quitting: 44.3   Smokeless  tobacco: Never  Vaping Use   Vaping Use: Never used  Substance and Sexual Activity   Alcohol use: No   Drug use: No   Sexual activity: Not on file  Other Topics Concern   Not on file  Social History Narrative   Lives in 2 story home alone - wife passed in 2022 - his children live nearby and visit often   He's involved in church   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, cuts wood, always working)   Social Determinants of Health   Financial Resource Strain: [place]  (05/02/2021)   Overall Financial Resource Strain (CARDIA)    Difficulty of Paying Living Expenses: Not hard at all  Food Insecurity: No [place] (05/02/2021)   Hunger Vital Sign    Worried About Running Out of Food in the Last Year: Never true    [place] in the Last Year: Never true  Transportation Needs: No Transportation Needs (05/02/2021)   PRAPARE - Hydrologist (Medical): No    Lack of Transportation (Non-Medical): No  Physical Activity: Sufficiently Active (05/02/2021)   Exercise Vital Sign    Days of Exercise per Week: 7 days    Minutes of Exercise per Session: 40 min  Stress: No Stress Concern Present (05/02/2021)   [employer]ational [employer] - Occupational Stress Questionnaire    Feeling of Stress : Not at all  Social Connections: Moderately Integrated (05/02/2021)   Social Connection and Isolation Panel [NHANES]    Frequency of Communication with Friends and Family: Three times a week    Frequency of Social Gatherings with Friends and Family: More than three times a week    Attends Religious Services: More than 4 times per year    Active Member of Clubs or Organizations: Yes    Attends Archivist Meetings: More than 4 times per year    Marital Status: Widowed  Intimate Partner Violence: Not At Risk (05/02/2021)   Humiliation, Afraid, Rape, and Kick questionnaire    Fear of Current or Ex-Partner: No    Emotionally Abused: No     Physically Abused: No    Sexually Abused: No   Family History  Problem Relation Age of Onset   Stroke Mother    Stroke Father    Arthritis Son    Hyperlipidemia Son    Hypertension Son    Colon cancer Neg Hx     Objective: Office vital signs reviewed. BP 129/74   Pulse (!) 51   Temp 98.1 F (36.7 C)   Ht _0  (1.803 m)   Wt 209 lb 3.2 oz (94.9 kg)   SpO2 94%   BMI 29.18 kg/m   Physical Examination:  General: Awake, alert, well nourished, No acute distress HEENT: sclera white, MMM Cardio: Slightly bradycardic with regular rhythm.  S1S2 heard, no murmurs appreciated Pulm: clear to auscultation bilaterally, no wheezes, rhonchi or rales; normal work of breathing on room air    Assessment/ Plan: 77 y.o. male   Diet-controlled diabetes mellitus ([place]) - Plan: CMP14+EGFR  Hyperlipidemia associated with type 2 diabetes mellitus ([place]) - Plan: Lipid panel, CMP14+EGFR  Hypertension associated with diabetes ([place]) - Plan: CMP14+EGFR  Diabetes diet controlled.  He will come in for fasting labs at a future date.  I am so glad to see that he was able to come off of medications and hope that he can continue his lifestyle changes going forward.  He will keep follow-up with endocrinology in January and I will be glad to see him in April for another close follow-up.  After that point, hopefully we can stretch his checkups to 38-monthintervals.  For now, continue statin and beta-blocker.  Blood pressure has been well controlled  Orders Placed This Encounter  Procedures   Lipid panel    Standing Status:   Future    Standing Expiration Date:   12/31/2022   CMP14+EGFR    Standing Status:   Future    Standing Expiration Date:   12/31/2022   No orders of the defined types were placed in this encounter.    AJanora Norlander DO [place]([phone]

## 2021-12-31 ENCOUNTER — Ambulatory Visit: Payer: Medicare Other | Admitting: Family Medicine

## 2021-12-31 ENCOUNTER — Other Ambulatory Visit: Payer: Medicare Other

## 2021-12-31 DIAGNOSIS — E119 Type 2 diabetes mellitus without complications: Secondary | ICD-10-CM | POA: Diagnosis not present

## 2021-12-31 DIAGNOSIS — E785 Hyperlipidemia, unspecified: Secondary | ICD-10-CM | POA: Diagnosis not present

## 2021-12-31 DIAGNOSIS — E1169 Type 2 diabetes mellitus with other specified complication: Secondary | ICD-10-CM | POA: Diagnosis not present

## 2021-12-31 DIAGNOSIS — E1159 Type 2 diabetes mellitus with other circulatory complications: Secondary | ICD-10-CM

## 2022-01-01 LAB — CMP14+EGFR
ALT: 21 IU/L (ref 0–44)
AST: 26 IU/L (ref 0–40)
Albumin/Globulin Ratio: 2 (ref 1.2–2.2)
Albumin: 4.4 g/dL (ref 3.8–4.8)
Alkaline Phosphatase: 82 IU/L (ref 44–121)
BUN/Creatinine Ratio: 22 (ref 10–24)
BUN: 20 mg/dL (ref 8–27)
Bilirubin Total: 1.1 mg/dL (ref 0.0–1.2)
CO2: 26 mmol/L (ref 20–29)
Calcium: 9.5 mg/dL (ref 8.6–10.2)
Chloride: 104 mmol/L (ref 96–106)
Creatinine, Ser: 0.92 mg/dL (ref 0.76–1.27)
Globulin, Total: 2.2 g/dL (ref 1.5–4.5)
Glucose: 103 mg/dL — ABNORMAL HIGH (ref 70–99)
Potassium: 5.1 mmol/L (ref 3.5–5.2)
Sodium: 142 mmol/L (ref 134–144)
Total Protein: 6.6 g/dL (ref 6.0–8.5)
eGFR: 85 mL/min/{1.73_m2} (ref >59)

## 2022-01-01 LAB — LIPID PANEL
Chol/HDL Ratio: 3.1 ratio (ref 0.0–5.0)
Cholesterol, Total: 151 mg/dL (ref 100–199)
HDL: 48 mg/dL (ref >39)
LDL Chol Calc (NIH): 88 mg/dL (ref 0–99)
Triglycerides: 77 mg/dL (ref 0–149)
VLDL Cholesterol Cal: 15 mg/dL (ref 5–40)

## 2022-01-31 ENCOUNTER — Other Ambulatory Visit: Payer: Self-pay | Admitting: Family Medicine

## 2022-03-16 ENCOUNTER — Ambulatory Visit: Payer: Medicare Other | Admitting: Nurse Practitioner

## 2022-03-16 ENCOUNTER — Encounter: Payer: Self-pay | Admitting: Nurse Practitioner

## 2022-03-16 VITALS — BP 146/74 | HR 50 | Ht 71.0 in | Wt 220.4 lb

## 2022-03-16 DIAGNOSIS — E119 Type 2 diabetes mellitus without complications: Secondary | ICD-10-CM

## 2022-03-16 LAB — POCT GLYCOSYLATED HEMOGLOBIN (HGB A1C): Hemoglobin A1C: 5.7 % — AB (ref 4.0–5.6)

## 2022-03-16 NOTE — Progress Notes (Signed)
Endocrinology Follow Up Note       03/16/2022, 9:05 AM   Subjective:    Patient ID: Robert Faulkner, male    DOB: 08/03/1945.  Robert Faulkner is being seen in follow up after being seen in consultation for management of currently uncontrolled symptomatic diabetes requested by  Janora Norlander, DO.   Past Medical History:  Diagnosis Date   Diabetes mellitus without complication ([place])    Essential tremor    Hypertension    Tremors of nervous system     Past Surgical History:  Procedure Laterality Date   COLONOSCOPY N/A 09/21/2014   Procedure: COLONOSCOPY;  Surgeon: Danie Binder, MD;  Location: [facility];  Service: Endoscopy;  Laterality: N/A;  1200 - moved to 10:30 - office to notify    Social History   Socioeconomic History   Marital status: Widowed    Spouse name: Not on file   Number of children: 3   Years of education: Not on file   Highest education level: 8th grade  Occupational History   Occupation: retired    Comment: [employer]  Tobacco Use   Smoking status: Former    Packs/day: 2.00    Years: 11.00    Total pack years: 22.00    Types: Cigarettes    Quit date: 09/20/1977    Years since quitting: 44.5   Smokeless tobacco: Never  Vaping Use   Vaping Use: Never used  Substance and Sexual Activity   Alcohol use: No   Drug use: No   Sexual activity: Not on file  Other Topics Concern   Not on file  Social History Narrative   Lives in 2 story home alone - wife passed in 2022 - his children live nearby and visit often   He's involved in church   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, cuts wood, always working)   Social Determinants of Health   Financial Resource Strain: [place]  (05/02/2021)   Overall Financial Resource Strain (CARDIA)    Difficulty of Paying Living Expenses: Not hard at all  Food Insecurity: No [place] (05/02/2021)    Hunger Vital Sign    Worried About Running Out of Food in the Last Year: Never true    [place] in the Last Year: Never true  Transportation Needs: No Transportation Needs (05/02/2021)   PRAPARE - Hydrologist (Medical): No    Lack of Transportation (Non-Medical): No  Physical Activity: Sufficiently Active (05/02/2021)   Exercise Vital Sign    Days of Exercise per Week: 7 days    Minutes of Exercise per Session: 40 min  Stress: No Stress Concern Present (05/02/2021)   [place]    Feeling of Stress : Not at all  Social Connections: Moderately Integrated (05/02/2021)   Social Connection and Isolation Panel [NHANES]    Frequency of Communication with Friends and Family: Three times a week    Frequency of Social Gatherings with Friends and Family: More than three times a week    Attends Religious Services: More than 4 times per year  Active Member of Clubs or Organizations: Yes    Attends Archivist Meetings: More than 4 times per year    Marital Status: Widowed    Family History  Problem Relation Age of Onset   Stroke Mother    Stroke Father    Arthritis Son    Hyperlipidemia Son    Hypertension Son    Colon cancer Neg Hx     Outpatient Encounter Medications as of 03/16/2022  Medication Sig   Multiple Vitamin (ONE-DAILY MULTI-VITAMIN PO) Take by mouth daily.   naproxen sodium (ALEVE) 220 MG tablet Take 220 mg by mouth. Patient states that he takes 2 by mouth every morning and 1 at bedtime   propranolol (INDERAL) 10 MG tablet Take 1 tablet (10 mg total) by mouth 3 (three) times daily.   rosuvastatin (CRESTOR) 10 MG tablet TAKE ONE (1) TABLET BY MOUTH EVERY DAY   No facility-administered encounter medications on file as of 03/16/2022.    ALLERGIES: No Known Allergies  VACCINATION STATUS: Immunization History  Administered Date(s) Administered   Fluad Quad(high Dose  65+) 12/28/2018, 11/20/2019, 11/26/2020, 11/13/2021   Influenza, High Dose Seasonal PF 11/25/2015, 12/21/2016   Influenza-Unspecified 12/04/2017   Moderna SARS-COV2 Booster Vaccination 07/24/2020   Moderna Sars-Covid-2 Vaccination 05/01/2019, 05/29/2019, 12/25/2019, 07/24/2020, 12/07/2020   Pneumococcal Conjugate-13 09/09/2016   Pneumococcal Polysaccharide-23 04/21/2018    Diabetes He presents for his follow-up diabetic visit. He has type 2 diabetes mellitus. Onset time: Diagnosed in July at approx age of 21. His disease course has been improving. There are no hypoglycemic associated symptoms. There are no diabetic associated symptoms. There are no hypoglycemic complications. There are no diabetic complications. Risk factors for coronary artery disease include diabetes mellitus, dyslipidemia, family history, obesity, male sex and hypertension. Current diabetic treatment includes diet. He is compliant with treatment most of the time. His weight is decreasing steadily. He is following a generally healthy diet. Meal planning includes avoidance of concentrated sweets. He has not had a previous visit with a dietitian. He participates in exercise daily. His home blood glucose trend is fluctuating minimally. His overall blood glucose range is 110-130 mg/dl. (He presents today, accompanied by his son, with his logs showing at target glycemic profile overall.  His POCT A1c today is 5.7%, essentially unchanged from previous visit.  Analysis of his meter shows 7-day average of 119, 14-day average of 116, 30-day average of 118, 90-day average of 108.  He has done well with managing his glucose with diet and lifestyle changes only.) An ACE inhibitor/angiotensin II receptor blocker is contraindicated. He does not see a podiatrist.Eye exam is current.     Review of systems  Constitutional: + Minimally fluctuating body weight, current Body mass index is 30.74 kg/m., no fatigue, no subjective hyperthermia, no  subjective hypothermia Eyes: no blurry vision, no xerophthalmia ENT: no sore throat, no nodules palpated in throat, no dysphagia/odynophagia, no hoarseness Cardiovascular: no chest pain, no shortness of breath, no palpitations, no leg swelling Respiratory: no cough, no shortness of breath Gastrointestinal: no nausea/vomiting/diarrhea Musculoskeletal: no muscle/joint aches Skin: no rashes, no hyperemia Neurological: + essential tremor-chronic per patient, no numbness, no tingling, no dizziness Psychiatric: no depression, no anxiety  Objective:     BP (!) 146/74 Comment: Manuel Cuff  Pulse (!) 50   Ht '5\' 11"'$  (1.803 m)   Wt 220 lb 6.4 oz (100 kg)   BMI 30.74 kg/m   Wt Readings from Last 3 Encounters:  03/16/22 220 lb 6.4 oz (100  kg)  12/30/21 209 lb 3.2 oz (94.9 kg)  12/15/21 212 lb (96.2 kg)     BP Readings from Last 3 Encounters:  03/16/22 (!) 146/74  12/30/21 129/74  12/15/21 127/66     Physical Exam- Limited  Constitutional:  Body mass index is 30.74 kg/m. , not in acute distress, normal state of mind Eyes:  EOMI, no exophthalmos Musculoskeletal: no gross deformities, strength intact in all four extremities, no gross restriction of joint movements Skin:  no rashes, no hyperemia Neurological: + chronic essential tremor    CMP ( most recent) CMP     Component Value Date/Time   NA 142 12/31/2021 0827   K 5.1 12/31/2021 0827   CL 104 12/31/2021 0827   CO2 26 12/31/2021 0827   GLUCOSE 103 (H) 12/31/2021 0827   GLUCOSE 165 (H) 09/21/2021 1449   BUN 20 12/31/2021 0827   CREATININE 0.92 12/31/2021 0827   CALCIUM 9.5 12/31/2021 0827   PROT 6.6 12/31/2021 0827   ALBUMIN 4.4 12/31/2021 0827   AST 26 12/31/2021 0827   ALT 21 12/31/2021 0827   ALKPHOS 82 12/31/2021 0827   BILITOT 1.1 12/31/2021 0827   GFRNONAA >60 09/21/2021 1449   GFRAA 97 08/04/2019 0845     Diabetic Labs (most recent): Lab Results  Component Value Date   HGBA1C 5.6 12/15/2021   HGBA1C  10.6 (H) 09/15/2021   HGBA1C 6.3 (H) 11/26/2020     Lipid Panel ( most recent) Lipid Panel     Component Value Date/Time   CHOL 151 12/31/2021 0827   TRIG 77 12/31/2021 0827   HDL 48 12/31/2021 0827   CHOLHDL 3.1 12/31/2021 0827   LDLCALC 88 12/31/2021 0827   LABVLDL 15 12/31/2021 0827      Lab Results  Component Value Date   TSH 1.940 11/26/2020   TSH 2.660 08/04/2019   TSH 0.277 (L) 01/25/2019           Assessment & Plan:   1) Type 2 diabetes mellitus without complication, without long-term current use of insulin ([place])  He presents today, accompanied by his son, with his logs showing at target glycemic profile overall.  His POCT A1c today is 5.7%, essentially unchanged from previous visit.  Analysis of his meter shows 7-day average of 119, 14-day average of 116, 30-day average of 118, 90-day average of 108.  He has done well with managing his glucose with diet and lifestyle changes only.  - Robert Faulkner has currently uncontrolled symptomatic type 2 DM since 77 years of age.  -Recent labs reviewed.  - I had a long discussion with him about the progressive nature of diabetes and the pathology behind its complications. -his diabetes is not currently complicated but he remains at a high risk for more acute and chronic complications which include CAD, CVA, CKD, retinopathy, and neuropathy. These are all discussed in detail with him.  The following Lifestyle Medicine recommendations according to [facility]) were discussed and offered to patient and he agrees to start the journey:  A. Whole Foods, Plant-based plate comprising of fruits and vegetables, plant-based proteins, whole-grain carbohydrates was discussed in detail with the patient.   A list for source of those nutrients were also provided to the patient.  Patient will use only water or unsweetened tea for hydration. B.  The need to stay away from risky substances including alcohol,  smoking; obtaining 7 to 9 hours of restorative sleep, at least 150 minutes of moderate intensity exercise  weekly, the importance of healthy social connections,  and stress reduction techniques were discussed. C.  A full color page of  Calorie density of various food groups per pound showing examples of each food groups was provided to the patient.  - Nutritional counseling repeated at each appointment due to patients tendency to fall back in to old habits.  - The patient admits there is a room for improvement in their diet and drink choices. -  Suggestion is made for the patient to avoid simple carbohydrates from their diet including Cakes, Sweet Desserts / Pastries, Ice Cream, Soda (diet and regular), Sweet Tea, Candies, Chips, Cookies, Sweet Pastries, Store Bought Juices, Alcohol in Excess of 1-2 drinks a day, Artificial Sweeteners, Coffee Creamer, and "Sugar-free" Products. This will help patient to have stable blood glucose profile and potentially avoid unintended weight gain.   - I encouraged the patient to switch to unprocessed or minimally processed complex starch and increased protein intake (animal or plant source), fruits, and vegetables.   - Patient is advised to stick to a routine mealtimes to eat 3 meals a day and avoid unnecessary snacks (to snack only to correct hypoglycemia).  - I have approached him with the following individualized plan to manage his diabetes and patient agrees:    -He can stay off medications altogether for now given his great glucose control with diet/lifestyle changes alone.  Will see him back in 6 months and if he is able to maintain such good control, will see him back on as needed basis.  -he can take a break from routine glucose monitoring for now.    - Specific targets for  A1c; LDL, HDL, and Triglycerides were discussed with the patient.  2) Blood Pressure /Hypertension:  his blood pressure is controlled to target for his age.   he is advised to  continue his current medications including Propanolol 10 mg p.o. daily with breakfast.  3) Lipids/Hyperlipidemia:    Review of his recent lipid panel from 12/31/21 showed controlled LDL 88.  he is advised to continue Crestor 10 mg daily at bedtime.  Side effects and precautions discussed with him.  4)  Weight/Diet:  his Body mass index is 30.74 kg/m.  -   he is NOT a candidate for weight loss.  Exercise, and detailed carbohydrates information provided  -  detailed on discharge instructions.  5) Chronic Care/Health Maintenance: -he is not on ACEI/ARB and is on Statin medications and is encouraged to initiate and continue to follow up with Ophthalmology, Dentist, Podiatrist at least yearly or according to recommendations, and advised to stay away from smoking. I have recommended yearly flu vaccine and pneumonia vaccine at least every 5 years; moderate intensity exercise for up to 150 minutes weekly; and sleep for at least 7 hours a day.  - he is advised to maintain close follow up with Janora Norlander, DO for primary care needs, as well as his other providers for optimal and coordinated care.     I spent 35 minutes in the care of the patient today including review of labs from [place], Lipids, Thyroid Function, Hematology (current and previous including abstractions from other facilities); face-to-face time discussing  his blood glucose readings/logs, discussing hypoglycemia and hyperglycemia episodes and symptoms, medications doses, his options of short and long term treatment based on the latest standards of care / guidelines;  discussion about incorporating lifestyle medicine;  and documenting the encounter. Risk reduction counseling performed per USPSTF guidelines to reduce obesity and cardiovascular  risk factors.     Please refer to Patient Instructions for Blood Glucose Monitoring and Insulin/Medications Dosing Guide"  in media tab for additional information. Please  also refer to " Patient  Self Inventory" in the Media  tab for reviewed elements of pertinent patient history.  Robert Faulkner participated in the discussions, expressed understanding, and voiced agreement with the above plans.  All questions were answered to his satisfaction. he is encouraged to contact clinic should he have any questions or concerns prior to his return visit.     Follow up plan: - Return in about 6 months (around 09/14/2022) for Diabetes F/U with A1c in office, No previsit labs.   Rayetta Pigg, [facility] [address] [place], [place] [address] Phone: [phone] Fax: [phone]  03/16/2022, 9:05 AM

## 2022-03-16 NOTE — Addendum Note (Signed)
Addended by: Grayland Ormond on: 03/16/2022 09:41 AM   Modules accepted: Orders

## 2022-05-06 ENCOUNTER — Ambulatory Visit (INDEPENDENT_AMBULATORY_CARE_PROVIDER_SITE_OTHER): Payer: Medicare Other

## 2022-05-06 VITALS — Ht 71.0 in | Wt 209.0 lb

## 2022-05-06 DIAGNOSIS — Z0000 Encounter for general adult medical examination without abnormal findings: Secondary | ICD-10-CM

## 2022-05-06 DIAGNOSIS — Z0100 Encounter for examination of eyes and vision without abnormal findings: Secondary | ICD-10-CM

## 2022-05-06 NOTE — Patient Instructions (Signed)
Mr. Robert Faulkner , Thank you for taking time to come for your Medicare Wellness Visit. I appreciate your ongoing commitment to your health goals. Please review the following plan we discussed and let me know if I can assist you in the future.   These are the goals we discussed:  Goals      AWV     04/24/2019 AWV Goal: Fall Prevention  Over the next year, patient will decrease their risk for falls by: Using assistive devices, such as a cane or walker, as needed Identifying fall risks within their home and correcting them by: Removing throw rugs Adding handrails to stairs or ramps Removing clutter and keeping a clear pathway throughout the home Increasing light, especially at night Adding shower handles/bars Raising toilet seat Identifying potential personal risk factors for falls: Medication side effects Incontinence/urgency Vestibular dysfunction Hearing loss Musculoskeletal disorders Neurological disorders Orthostatic hypotension       Exercise 3x per week (30 min per time)     Continue to exercise Daily      Have 3 meals a day     Try lean grilled or baked meats.      Patient Stated     04/30/2020 AWV Goal: Fall Prevention  Over the next year, patient will decrease their risk for falls by: Using assistive devices, such as a cane or walker, as needed Identifying fall risks within their home and correcting them by: Removing throw rugs Adding handrails to stairs or ramps Removing clutter and keeping a clear pathway throughout the home Increasing light, especially at night Adding shower handles/bars Raising toilet seat Identifying potential personal risk factors for falls: Medication side effects Incontinence/urgency Vestibular dysfunction Hearing loss Musculoskeletal disorders Neurological disorders Orthostatic hypotension          This is a list of the screening recommended for you and due dates:  Health Maintenance  Topic Date Due   DTaP/Tdap/Td vaccine (1  - Tdap) Never done   Zoster (Shingles) Vaccine (1 of 2) Never done   COVID-19 Vaccine (7 - 2023-24 season) 10/24/2021   Hepatitis C Screening: USPSTF Recommendation to screen - Ages 18-79 yo.  12/30/2022*   Hemoglobin A1C  09/14/2022   Yearly kidney health urinalysis for diabetes  09/16/2022   Complete foot exam   10/02/2022   Eye exam for diabetics  10/14/2022   Yearly kidney function blood test for diabetes  01/01/2023   Medicare Annual Wellness Visit  05/06/2023   Pneumonia Vaccine  Completed   Flu Shot  Completed   HPV Vaccine  Aged Out   Colon Cancer Screening  Discontinued  *Topic was postponed. The date shown is not the original due date.    Advanced directives: Advance directive discussed with you today. I have provided a copy for you to complete at home and have notarized. Once this is complete please bring a copy in to our office so we can scan it into your chart.   Conditions/risks identified: Aim for 30 minutes of exercise or brisk walking, 6-8 glasses of water, and 5 servings of fruits and vegetables each day.   Next appointment: Follow up in one year for your annual wellness visit.   Preventive Care 69 Years and Older, Male  Preventive care refers to lifestyle choices and visits with your health care provider that can promote health and wellness. What does preventive care include? A yearly physical exam. This is also called an annual well check. Dental exams once or twice a year. Routine eye exams.  Ask your health care provider how often you should have your eyes checked. Personal lifestyle choices, including: Daily care of your teeth and gums. Regular physical activity. Eating a healthy diet. Avoiding tobacco and drug use. Limiting alcohol use. Practicing safe sex. Taking low doses of aspirin every day. Taking vitamin and mineral supplements as recommended by your health care provider. What happens during an annual well check? The services and screenings done by  your health care provider during your annual well check will depend on your age, overall health, lifestyle risk factors, and family history of disease. Counseling  Your health care provider may ask you questions about your: Alcohol use. Tobacco use. Drug use. Emotional well-being. Home and relationship well-being. Sexual activity. Eating habits. History of falls. Memory and ability to understand (cognition). Work and work Statistician. Screening  You may have the following tests or measurements: Height, weight, and BMI. Blood pressure. Lipid and cholesterol levels. These may be checked every 5 years, or more frequently if you are over 17 years old. Skin check. Lung cancer screening. You may have this screening every year starting at age 63 if you have a 30-pack-year history of smoking and currently smoke or have quit within the past 15 years. Fecal occult blood test (FOBT) of the stool. You may have this test every year starting at age 31. Flexible sigmoidoscopy or colonoscopy. You may have a sigmoidoscopy every 5 years or a colonoscopy every 10 years starting at age 78. Prostate cancer screening. Recommendations will vary depending on your family history and other risks. Hepatitis C blood test. Hepatitis B blood test. Sexually transmitted disease (STD) testing. Diabetes screening. This is done by checking your blood sugar (glucose) after you have not eaten for a while (fasting). You may have this done every 1-3 years. Abdominal aortic aneurysm (AAA) screening. You may need this if you are a current or former smoker. Osteoporosis. You may be screened starting at age 31 if you are at high risk. Talk with your health care provider about your test results, treatment options, and if necessary, the need for more tests. Vaccines  Your health care provider may recommend certain vaccines, such as: Influenza vaccine. This is recommended every year. Tetanus, diphtheria, and acellular pertussis  (Tdap, Td) vaccine. You may need a Td booster every 10 years. Zoster vaccine. You may need this after age 35. Pneumococcal 13-valent conjugate (PCV13) vaccine. One dose is recommended after age 46. Pneumococcal polysaccharide (PPSV23) vaccine. One dose is recommended after age 46. Talk to your health care provider about which screenings and vaccines you need and how often you need them. This information is not intended to replace advice given to you by your health care provider. Make sure you discuss any questions you have with your health care provider. Document Released: 03/08/2015 Document Revised: 10/30/2015 Document Reviewed: 12/11/2014 Elsevier Interactive Patient Education  2017 [place] Prevention in the Home Falls can cause injuries. They can happen to people of all ages. There are many things you can do to make your home safe and to help prevent falls. What can I do on the outside of my home? Regularly fix the edges of walkways and driveways and fix any cracks. Remove anything that might make you trip as you walk through a door, such as a raised step or threshold. Trim any bushes or trees on the path to your home. Use bright outdoor lighting. Clear any walking paths of anything that might make someone trip, such as rocks  or tools. Regularly check to see if handrails are loose or broken. Make sure that both sides of any steps have handrails. Any raised decks and porches should have guardrails on the edges. Have any leaves, snow, or ice cleared regularly. Use sand or salt on walking paths during winter. Clean up any spills in your garage right away. This includes oil or grease spills. What can I do in the bathroom? Use night lights. Install grab bars by the toilet and in the tub and shower. Do not use towel bars as grab bars. Use non-skid mats or decals in the tub or shower. If you need to sit down in the shower, use a plastic, non-slip stool. Keep the floor dry. Clean  up any water that spills on the floor as soon as it happens. Remove soap buildup in the tub or shower regularly. Attach bath mats securely with double-sided non-slip rug tape. Do not have throw rugs and other things on the floor that can make you trip. What can I do in the bedroom? Use night lights. Make sure that you have a light by your bed that is easy to reach. Do not use any sheets or blankets that are too big for your bed. They should not hang down onto the floor. Have a firm chair that has side arms. You can use this for support while you get dressed. Do not have throw rugs and other things on the floor that can make you trip. What can I do in the kitchen? Clean up any spills right away. Avoid walking on wet floors. Keep items that you use a lot in easy-to-reach places. If you need to reach something above you, use a strong step stool that has a grab bar. Keep electrical cords out of the way. Do not use floor polish or wax that makes floors slippery. If you must use wax, use non-skid floor wax. Do not have throw rugs and other things on the floor that can make you trip. What can I do with my stairs? Do not leave any items on the stairs. Make sure that there are handrails on both sides of the stairs and use them. Fix handrails that are broken or loose. Make sure that handrails are as long as the stairways. Check any carpeting to make sure that it is firmly attached to the stairs. Fix any carpet that is loose or worn. Avoid having throw rugs at the top or bottom of the stairs. If you do have throw rugs, attach them to the floor with carpet tape. Make sure that you have a light switch at the top of the stairs and the bottom of the stairs. If you do not have them, ask someone to add them for you. What else can I do to help prevent falls? Wear shoes that: Do not have high heels. Have rubber bottoms. Are comfortable and fit you well. Are closed at the toe. Do not wear sandals. If you  use a stepladder: Make sure that it is fully opened. Do not climb a closed stepladder. Make sure that both sides of the stepladder are locked into place. Ask someone to hold it for you, if possible. Clearly mark and make sure that you can see: Any grab bars or handrails. First and last steps. Where the edge of each step is. Use tools that help you move around (mobility aids) if they are needed. These include: Canes. Walkers. Scooters. Crutches. Turn on the lights when you go into a dark area.  Replace any light bulbs as soon as they burn out. Set up your furniture so you have a clear path. Avoid moving your furniture around. If any of your floors are uneven, fix them. If there are any pets around you, be aware of where they are. Review your medicines with your doctor. Some medicines can make you feel dizzy. This can increase your chance of falling. Ask your doctor what other things that you can do to help prevent falls. This information is not intended to replace advice given to you by your health care provider. Make sure you discuss any questions you have with your health care provider. Document Released: 12/06/2008 Document Revised: 07/18/2015 Document Reviewed: 03/16/2014 Elsevier Interactive Patient Education  2017 [employer].

## 2022-05-06 NOTE — Progress Notes (Signed)
Subjective:   Robert Faulkner is a 77 y.o. male who presents for Medicare Annual/Subsequent preventive examination. I connected with  [name] on 05/06/22 by a audio enabled telemedicine application and verified that I am speaking with the correct person using two identifiers.  Patient Location: Home  Provider Location: Home Office  I discussed the limitations of evaluation and management by telemedicine. The patient expressed understanding and agreed to proceed.  Review of Systems     Cardiac Risk Factors include: advanced age (>69mn, >>50women);male gender;hypertension     Objective:    Today's Vitals   05/06/22 1201  Weight: 209 lb (94.8 kg)  Height: '5\' 11"'$  (1.803 m)   Body mass index is 29.15 kg/m.     05/06/2022   12:06 PM 09/21/2021    2:13 PM 05/02/2021   12:12 PM 04/30/2020    9:58 AM 04/24/2019    9:37 AM 01/21/2019    7:45 PM 01/21/2019    4:30 PM  Advanced Directives  Does Patient Have a Medical Advance Directive? No Yes Yes No Yes Yes Yes  Type of Advance Directive  [place]  [place]  Living will  Does patient want to make changes to medical advance directive?      No - Patient declined No - Patient declined  Copy of HBeech [place] Chart?   No - copy requested  No - copy requested    Would patient like information on creating a medical advance directive? No - Patient declined   No - Patient declined       Current Medications (verified) Outpatient Encounter Medications as of 05/06/2022  Medication Sig   Multiple Vitamin (ONE-DAILY MULTI-VITAMIN PO) Take by mouth daily.   naproxen sodium (ALEVE) 220 MG tablet Take 220 mg by mouth. Patient states that he takes 2 by mouth every morning and 1 at bedtime   propranolol (INDERAL) 10 MG tablet Take 1 tablet (10 mg total) by mouth 3 (three) times daily.   rosuvastatin (CRESTOR) 10 MG tablet TAKE ONE (1) TABLET BY  MOUTH EVERY DAY   No facility-administered encounter medications on file as of 05/06/2022.    Allergies (verified) Patient has no known allergies.   History: Past Medical History:  Diagnosis Date   Diabetes mellitus without complication ([place]    Essential tremor    Hypertension    Tremors of nervous system    Past Surgical History:  Procedure Laterality Date   COLONOSCOPY N/A 09/21/2014   Procedure: COLONOSCOPY;  Surgeon: SDanie Binder MD;  [facility];  Service: Endoscopy;  Laterality: N/A;  1200 - moved to 10:30 - office to notify   Family History  Problem Relation Age of Onset   Stroke Mother    Stroke Father    Arthritis Son    Hyperlipidemia Son    Hypertension Son    Colon cancer Neg Hx    Social History   Socioeconomic History   Marital status: Widowed    Spouse name: Not on file   Number of children: 3   Years of education: Not on file   Highest education level: 8th grade  Occupational History   Occupation: retired    [place] Tobacco Use   Smoking status: Former    Packs/day: 2.00    Years: 11.00    Total pack years: 22.00    Types: Cigarettes    Quit date:  09/20/1977    Years since quitting: 44.6   Smokeless tobacco: Never  Vaping Use   Vaping Use: Never used  Substance and Sexual Activity   Alcohol use: No   Drug use: No   Sexual activity: Not on file  Other Topics Concern   Not on file  Social History Narrative   Lives in 2 story home alone - wife passed in 2022 - his children live nearby and visit often   He's involved in church   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, cuts wood, always working)   Social Determinants of Health   Financial Resource Strain: [place]  (05/06/2022)   Overall Financial Resource Strain (CARDIA)    Difficulty of Paying Living Expenses: Not hard at all  Food Insecurity: No Food Insecurity (05/06/2022)   Hunger Vital Sign    Worried About Running Out of Food  in the Last Year: Never true    [place] in the Last Year: Never true  Transportation Needs: No Transportation Needs (05/06/2022)   PRAPARE - Hydrologist (Medical): No    Lack of Transportation (Non-Medical): No  Physical Activity: Insufficiently Active (05/06/2022)   Exercise Vital Sign    Days of Exercise per Week: 3 days    Minutes of Exercise per Session: 30 min  Stress: No Stress Concern Present (05/06/2022)   [place]    Feeling of Stress : Not at all  Social Connections: Moderately Integrated (05/06/2022)   Social Connection and Isolation Panel [NHANES]    Frequency of Communication with Friends and Family: More than three times a week    Frequency of Social Gatherings with Friends and Family: More than three times a week    Attends Religious Services: More than 4 times per year    Active Member of [employer] or Organizations: Yes    Attends Archivist Meetings: More than 4 times per year    Marital Status: Widowed    Tobacco Counseling Counseling given: Not Answered   Clinical Intake:  Pre-visit preparation completed: Yes  Pain : No/denies pain     Nutritional Risks: None Diabetes: No  How often do you need to have someone help you when you read instructions, pamphlets, or other written materials from your doctor or pharmacy?: 1 - Never  Diabetic?no   Interpreter Needed?: No  Information entered by :: Jadene Pierini, LPN   Activities of Daily Living    05/06/2022   12:06 PM  In your present state of health, do you have any difficulty performing the following activities:  Hearing? 0  Vision? 0  Difficulty concentrating or making decisions? 0  Walking or climbing stairs? 0  Dressing or bathing? 0  Doing errands, shopping? 0  Preparing Food and eating ? N  Using the Toilet? N  In the past six months, have you accidently leaked urine? N  Do you have  problems with loss of bowel control? N  Managing your Medications? N  Managing your Finances? N  Housekeeping or managing your Housekeeping? N    Patient Care Team: Janora Norlander, DO as PCP - General (Family Medicine) Danie Binder, MD (Inactive) as Consulting Physician (Gastroenterology) Clinic, Lynn Ito, [facility] as Pharmacist (Family Medicine)  Indicate any recent Medical Services you may have received from other than [facility] providers in the past year (date may be approximate).     Assessment:  This is a routine wellness examination for [place].  Hearing/Vision screen Vision Screening - Comments:: Referral 05/06/2022  Dietary issues and exercise activities discussed: Current Exercise Habits: Home exercise routine, Type of exercise: walking, Time (Minutes): 30, Frequency (Times/Week): 3, Weekly Exercise (Minutes/Week): 90, Intensity: Mild, Exercise limited by: None identified   Goals Addressed             This Visit's Progress    AWV   On track    04/24/2019 AWV Goal: Fall Prevention  Over the next year, patient will decrease their risk for falls by: Using assistive devices, such as a cane or walker, as needed Identifying fall risks within their home and correcting them by: Removing throw rugs Adding handrails to stairs or ramps Removing clutter and keeping a clear pathway throughout the home Increasing light, especially at night Adding shower handles/bars Raising toilet seat Identifying potential personal risk factors for falls: Medication side effects Incontinence/urgency Vestibular dysfunction Hearing loss Musculoskeletal disorders Neurological disorders Orthostatic hypotension         Depression Screen    05/06/2022   12:05 PM 12/30/2021   10:13 AM 11/20/2021    1:10 PM 11/13/2021    9:38 AM 10/01/2021    1:27 PM 09/15/2021    2:19 PM 05/02/2021   12:04 PM  PHQ 2/9 Scores  PHQ - 2 Score 0 0 0 0 0 0 0  PHQ- 9 Score  0  0        Fall Risk    05/06/2022   12:04 PM 12/30/2021   10:13 AM 11/20/2021    1:10 PM 11/13/2021    9:38 AM 10/01/2021    1:27 PM  Fall Risk   Falls in the past year? 0 0 0 0 0  Number falls in past yr: 0      Injury with Fall? 0      Risk for fall due to : No Fall Risks      Follow up Falls prevention discussed        FALL RISK PREVENTION PERTAINING TO THE HOME:  Any stairs in or around the home? Yes  If so, are there any without handrails? No  Home free of loose throw rugs in walkways, pet beds, electrical cords, etc? Yes  Adequate lighting in your home to reduce risk of falls? Yes   ASSISTIVE DEVICES UTILIZED TO PREVENT FALLS:  Life alert? No  Use of a cane, walker or w/c? No  Grab bars in the bathroom? Yes  Shower chair or bench in shower? Yes  Elevated toilet seat or a handicapped toilet? Yes       11/26/2020   10:13 AM 04/21/2018    2:25 PM 09/09/2016    8:44 AM  MMSE - Mini Mental State Exam  Orientation to time '4 5 5  '$ Orientation to Place '5 4 5  '$ Registration '3 3 3  '$ Attention/ Calculation 0 4 4  Recall 0 3 2  Language- name 2 objects '2 2 2  '$ Language- repeat '1 1 1  '$ Language- follow 3 step command '3 3 3  '$ Language- read & follow direction '1 1 1  '$ Write a sentence '1 1 1  '$ Copy design '1 1 1  '$ Total score '21 28 28        '$ 05/06/2022   12:07 PM 05/02/2021   12:08 PM 04/30/2020    9:59 AM 04/24/2019    9:42 AM  6CIT Screen  What Year? 0 points 0 points 0 points 0 points  What month? 0 points 0 points 0 points 0 points  What time? 0 points 0 points 0 points 0 points  Count back from 20 0 points 0 points 0 points 2 points  Months in reverse 0 points 0 points 0 points 0 points  Repeat phrase 0 points 2 points 2 points 2 points  Total Score 0 points 2 points 2 points 4 points    Immunizations Immunization History  Administered Date(s) Administered   Fluad Quad(high Dose 65+) 12/28/2018, 11/20/2019, 11/26/2020, 11/13/2021   Influenza, High Dose Seasonal PF 11/25/2015,  12/21/2016   Influenza-Unspecified 12/04/2017   Moderna SARS-COV2 Booster Vaccination 07/24/2020   Moderna Sars-Covid-2 Vaccination 05/01/2019, 05/29/2019, 12/25/2019, 07/24/2020, 12/07/2020   Pneumococcal Conjugate-13 09/09/2016   Pneumococcal Polysaccharide-23 04/21/2018    TDAP status: Due, Education has been provided regarding the importance of this vaccine. Advised may receive this vaccine at local pharmacy or Health Dept. Aware to provide a copy of the vaccination record if obtained from local pharmacy or Health Dept. Verbalized acceptance and understanding.  Flu Vaccine status: Up to date  Pneumococcal vaccine status: Up to date  Covid-19 vaccine status: Completed vaccines  Qualifies for Shingles Vaccine? Yes   Zostavax completed No   Shingrix Completed?: No.    Education has been provided regarding the importance of this vaccine. Patient has been advised to call insurance company to determine out of pocket expense if they have not yet received this vaccine. Advised may also receive vaccine at local pharmacy or Health Dept. Verbalized acceptance and understanding.  Screening Tests Health Maintenance  Topic Date Due   DTaP/Tdap/Td (1 - Tdap) Never done   Zoster Vaccines- Shingrix (1 of 2) Never done   COVID-19 Vaccine (7 - 2023-24 season) 10/24/2021   Hepatitis C Screening  12/30/2022 (Originally 03/10/1963)   HEMOGLOBIN A1C  09/14/2022   Diabetic kidney evaluation - Urine ACR  09/16/2022   FOOT EXAM  10/02/2022   OPHTHALMOLOGY EXAM  10/14/2022   Diabetic kidney evaluation - eGFR measurement  01/01/2023   Medicare Annual Wellness (AWV)  05/06/2023   Pneumonia Vaccine 49+ Years old  Completed   INFLUENZA VACCINE  Completed   HPV VACCINES  Aged Out   COLONOSCOPY (Pts 45-91yr Insurance coverage will need to be confirmed)  Discontinued    Health Maintenance  Health Maintenance Due  Topic Date Due   DTaP/Tdap/Td (1 - Tdap) Never done   Zoster Vaccines- Shingrix (1 of 2)  Never done   COVID-19 Vaccine (7 - 2023-24 season) 10/24/2021    Colorectal cancer screening: No longer required.   Lung Cancer Screening: (Low Dose CT Chest recommended if Age 79-80years, 30 pack-year currently smoking OR have quit w/in 15years.) does not qualify.   Lung Cancer Screening Referral: n/a  Additional Screening:  Hepatitis C Screening: does not qualify;   Vision Screening: Recommended annual ophthalmology exams for early detection of glaucoma and other disorders of the eye. Is the patient up to date with their annual eye exam?  No  Who is the provider or what is the name of the office in which the patient attends annual eye exams? None referral 05/06/2022 If pt is not established with a provider, would they like to be referred to a provider to establish care? No .   Dental Screening: Recommended annual dental exams for proper oral hygiene  Community Resource Referral / Chronic Care Management: CRR required this visit?  No   CCM required this visit?  No  Plan:     I have personally reviewed and noted the following in the patient's chart:   Medical and social history Use of alcohol, tobacco or illicit drugs  Current medications and supplements including opioid prescriptions. Patient is not currently taking opioid prescriptions. Functional ability and status Nutritional status Physical activity Advanced directives List of other physicians Hospitalizations, surgeries, and ER visits in previous 12 months Vitals Screenings to include cognitive, depression, and falls Referrals and appointments  In addition, I have reviewed and discussed with patient certain preventive protocols, quality metrics, and best practice recommendations. A written personalized care plan for preventive services as well as general preventive health recommendations were provided to patient.     Daphane Shepherd, LPN   [id number]   Nurse Notes: Due Tdap Vaccine

## 2022-05-12 NOTE — Progress Notes (Signed)
I have reviewed and agree with the above AWV documentation.   Monia Pouch, FNP-C [facility] [address] [place], [place] [address] [phone] 05/12/22

## 2022-06-08 ENCOUNTER — Ambulatory Visit (INDEPENDENT_AMBULATORY_CARE_PROVIDER_SITE_OTHER): Payer: Medicare Other | Admitting: Family Medicine

## 2022-06-08 ENCOUNTER — Encounter: Payer: Self-pay | Admitting: Family Medicine

## 2022-06-08 VITALS — BP 155/77 | HR 52 | Temp 98.6°F | Ht 71.0 in | Wt 217.0 lb

## 2022-06-08 DIAGNOSIS — E1159 Type 2 diabetes mellitus with other circulatory complications: Secondary | ICD-10-CM

## 2022-06-08 DIAGNOSIS — E1169 Type 2 diabetes mellitus with other specified complication: Secondary | ICD-10-CM

## 2022-06-08 DIAGNOSIS — G250 Essential tremor: Secondary | ICD-10-CM | POA: Diagnosis not present

## 2022-06-08 DIAGNOSIS — E785 Hyperlipidemia, unspecified: Secondary | ICD-10-CM

## 2022-06-08 DIAGNOSIS — I152 Hypertension secondary to endocrine disorders: Secondary | ICD-10-CM

## 2022-06-08 DIAGNOSIS — E119 Type 2 diabetes mellitus without complications: Secondary | ICD-10-CM

## 2022-06-08 LAB — BAYER DCA HB A1C WAIVED: HB A1C (BAYER DCA - WAIVED): 5.7 % — ABNORMAL HIGH (ref 4.8–5.6)

## 2022-06-08 MED ORDER — PROPRANOLOL HCL 10 MG PO TABS: 10.0000 mg | ORAL_TABLET | Freq: Three times a day (TID) | ORAL | 3 refills | Status: DC

## 2022-06-08 MED ORDER — ROSUVASTATIN CALCIUM 10 MG PO TABS: ORAL_TABLET | ORAL | 3 refills | Status: DC

## 2022-06-08 NOTE — Progress Notes (Signed)
Subjective: CC:DM PCP: Raliegh Ip, DO LFY:[id number] Robert Faulkner is a 77 y.o. male presenting to clinic today for:  1. Type 2 Diabetes with hypertension, hyperlipidemia:  He is accompanied to today's visit by his son.  He reports he is feeling the best he ever has. No concerns today. High at home: 117; Low at home: 41, Taking medication(s): none.  DM diet controlled.  Compliant with Propranolol and Crestor.  BP runs 140/80s at home  Last eye exam: UTD Last foot exam: UTD Last A1c:  Lab Results  Component Value Date   HGBA1C 5.7 (A) 03/16/2022   Nephropathy screen indicated?: UTD Last flu, zoster and/or pneumovax:  Immunization History  Administered Date(s) Administered   Fluad Quad(high Dose 65+) 12/28/2018, 11/20/2019, 11/26/2020, 11/13/2021   Influenza, High Dose Seasonal PF 11/25/2015, 12/21/2016   Influenza-Unspecified 12/04/2017   Moderna SARS-COV2 Booster Vaccination 07/24/2020   Moderna Sars-Covid-2 Vaccination 05/01/2019, 05/29/2019, 12/25/2019, 07/24/2020, 12/07/2020   Pneumococcal Conjugate-13 09/09/2016   Pneumococcal Polysaccharide-23 04/21/2018    ROS: Denies dizziness, LOC, polyuria, polydipsia, unintended weight loss/gain, foot ulcerations, numbness or tingling in extremities, shortness of breath or chest pain.    ROS: Per HPI  No Known Allergies Past Medical History:  Diagnosis Date   Diabetes mellitus without complication    Essential tremor    Hypertension    Tremors of nervous system     Current Outpatient Medications:    Multiple Vitamin (ONE-DAILY MULTI-VITAMIN PO), Take by mouth daily., Disp: , Rfl:    naproxen sodium (ALEVE) 220 MG tablet, Take 220 mg by mouth. Patient states that he takes 2 by mouth every morning and 1 at bedtime, Disp: , Rfl:    propranolol (INDERAL) 10 MG tablet, Take 1 tablet (10 mg total) by mouth 3 (three) times daily., Disp: 270 tablet, Rfl: 3   rosuvastatin (CRESTOR) 10 MG tablet, TAKE ONE (1) TABLET BY MOUTH  EVERY DAY, Disp: 90 tablet, Rfl: 1 Social History   Socioeconomic History   Marital status: Widowed    Spouse name: Not on file   Number of children: 3   Years of education: Not on file   Highest education level: 8th grade  Occupational History   Occupation: retired    Comment: [employer]  Tobacco Use   Smoking status: Former    Packs/day: 2.00    Years: 11.00    Additional pack years: 0.00    Total pack years: 22.00    Types: Cigarettes    Quit date: 09/20/1977    Years since quitting: 44.7   Smokeless tobacco: Never  Vaping Use   Vaping Use: Never used  Substance and Sexual Activity   Alcohol use: No   Drug use: No   Sexual activity: Not on file  Other Topics Concern   Not on file  Social History Narrative   Lives in 2 story home alone - wife passed in 2022 - his children live nearby and visit often   He's involved in church   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, cuts wood, always working)   Social Determinants of Health   Financial Resource Strain: Low Risk  (06/07/2022)   Overall Financial Resource Strain (CARDIA)    Difficulty of Paying Living Expenses: Not hard at all  Food Insecurity: No Food Insecurity (06/07/2022)   Hunger Vital Sign    Worried About Running Out of Food in the Last Year: Never true    Ran Out of Food in the Last Year:  Never true  Transportation Needs: No Transportation Needs (06/07/2022)   PRAPARE - Administrator, Civil Service (Medical): No    Lack of Transportation (Non-Medical): No  Physical Activity: Sufficiently Active (06/07/2022)   Exercise Vital Sign    Days of Exercise per Week: 4 days    Minutes of Exercise per Session: 40 min  Recent Concern: Physical Activity - Insufficiently Active (05/06/2022)   Exercise Vital Sign    Days of Exercise per Week: 3 days    Minutes of Exercise per Session: 30 min  Stress: No Stress Concern Present (06/07/2022)   [employer] of Occupational Health -  Occupational Stress Questionnaire    Feeling of Stress : Not at all  Social Connections: Moderately Integrated (06/07/2022)   Social Connection and Isolation Panel [NHANES]    Frequency of Communication with Friends and Family: More than three times a week    Frequency of Social Gatherings with Friends and Family: More than three times a week    Attends Religious Services: More than 4 times per year    Active Member of [employer] or Organizations: Yes    Attends Banker Meetings: More than 4 times per year    Marital Status: Widowed  Intimate Partner Violence: Not At Risk (05/06/2022)   Humiliation, Afraid, Rape, and Kick questionnaire    Fear of Current or Ex-Partner: No    Emotionally Abused: No    Physically Abused: No    Sexually Abused: No   Family History  Problem Relation Age of Onset   Stroke Mother    Stroke Father    Arthritis Son    Hyperlipidemia Son    Hypertension Son    Colon cancer Neg Hx     Objective: Office vital signs reviewed. BP (!) 155/77   Pulse (!) 52   Temp 98.6 F (37 C)   Ht  (1.803 m)   Wt 217 lb (98.4 kg)   SpO2 95%   BMI 30.27 kg/m   Physical Examination:  General: Awake, alert, well nourished, No acute distress HEENT: sclera white, MMM. No carotid bruits Cardio: bradycardic with regular rhythm, S1S2 heard, no murmurs appreciated Pulm: clear to auscultation bilaterally, no wheezes, rhonchi or rales; normal work of breathing on room air Neuro: no tremor appreciated today    Assessment/ Plan: 77 y.o. male   Diet-controlled diabetes mellitus - Plan: Bayer DCA Hb A1c Waived  Hyperlipidemia associated with type 2 diabetes mellitus - Plan: rosuvastatin (CRESTOR) 10 MG tablet  Hypertension associated with diabetes - Plan: propranolol (INDERAL) 10 MG tablet  Tremor, essential - Plan: propranolol (INDERAL) 10 MG tablet  Sugar remains well controlled with A1c 5.7 today.  No meds needed  Not due for fasting lipid.  Crestor  renewed  BP not at goal but has not taken propranolol today.  Return in 2 weeks for BP check with nurse.  Continue current med. Limit salt.  Rx renewed   Orders Placed This Encounter  Procedures   Bayer DCA Hb A1c Waived   No orders of the defined types were placed in this encounter.    Raliegh Ip, DO [facility] [phone]

## 2022-06-22 ENCOUNTER — Ambulatory Visit: Payer: Medicare Other

## 2022-06-22 VITALS — BP 134/73 | HR 56

## 2022-06-22 DIAGNOSIS — Z0130 Encounter for examination of blood pressure without abnormal findings: Secondary | ICD-10-CM

## 2022-06-22 NOTE — Progress Notes (Signed)
Patient here today for blood pressure check Blood pressure 134/73, pulse 56

## 2022-09-14 ENCOUNTER — Encounter: Payer: Self-pay | Admitting: Nurse Practitioner

## 2022-09-14 ENCOUNTER — Ambulatory Visit: Payer: Medicare Other | Admitting: Nurse Practitioner

## 2022-09-14 VITALS — BP 142/80 | HR 49 | Ht 71.0 in | Wt 219.2 lb

## 2022-09-14 DIAGNOSIS — E119 Type 2 diabetes mellitus without complications: Secondary | ICD-10-CM

## 2022-09-14 LAB — POCT GLYCOSYLATED HEMOGLOBIN (HGB A1C): Hemoglobin A1C: 5.7 % — AB (ref 4.0–5.6)

## 2022-09-14 NOTE — Progress Notes (Signed)
Endocrinology Follow Up Note       09/14/2022, 8:38 AM   Subjective:    Patient ID: Robert Faulkner, male    DOB: June 05, 1945.  Robert Faulkner is being seen in follow up after being seen in consultation for management of currently uncontrolled symptomatic diabetes requested by  Raliegh Ip, DO.   Past Medical History:  Diagnosis Date   Diabetes mellitus without complication (HCC)    Essential tremor    Hypertension    Tremors of nervous system     Past Surgical History:  Procedure Laterality Date   COLONOSCOPY N/A 09/21/2014   Procedure: COLONOSCOPY;  Surgeon: West Bali, MD;  Location: [facility];  Service: Endoscopy;  Laterality: N/A;  1200 - moved to 10:30 - office to notify    Social History   Socioeconomic History   Marital status: Widowed    Spouse name: Not on file   Number of children: 3   Years of education: Not on file   Highest education level: 8th grade  Occupational History   Occupation: retired    Comment: [employer]  Tobacco Use   Smoking status: Former    Current packs/day: 0.00    Average packs/day: 2.0 packs/day for 11.0 years (22.0 ttl pk-yrs)    Types: Cigarettes    Start date: 09/21/1966    Quit date: 09/20/1977    Years since quitting: 45.0   Smokeless tobacco: Never  Vaping Use   Vaping status: Never Used  Substance and Sexual Activity   Alcohol use: No   Drug use: No   Sexual activity: Not on file  Other Topics Concern   Not on file  Social History Narrative   Lives in 2 story home alone - wife passed in 2022 - his children live nearby and visit often   He's involved in church   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, cuts wood, always working)   Social Determinants of Health   Financial Resource Strain: Low Risk  (06/07/2022)   Overall Financial Resource Strain (CARDIA)    Difficulty of Paying Living Expenses: Not  hard at all  Food Insecurity: No Food Insecurity (06/07/2022)   Hunger Vital Sign    Worried About Running Out of Food in the Last Year: Never true    Ran Out of Food in the Last Year: Never true  Transportation Needs: No Transportation Needs (06/07/2022)   PRAPARE - Administrator, Civil Service (Medical): No    Lack of Transportation (Non-Medical): No  Physical Activity: Sufficiently Active (06/07/2022)   Exercise Vital Sign    Days of Exercise per Week: 4 days    Minutes of Exercise per Session: 40 min  Recent Concern: Physical Activity - Insufficiently Active (05/06/2022)   Exercise Vital Sign    Days of Exercise per Week: 3 days    Minutes of Exercise per Session: 30 min  Stress: No Stress Concern Present (06/07/2022)   [employer] of Occupational Health - Occupational Stress Questionnaire    Feeling of Stress : Not at all  Social Connections: Moderately Integrated (06/07/2022)   Social Connection and Isolation Panel [NHANES]  Frequency of Communication with Friends and Family: More than three times a week    Frequency of Social Gatherings with Friends and Family: More than three times a week    Attends Religious Services: More than 4 times per year    Active Member of [employer] or Organizations: Yes    Attends Banker Meetings: More than 4 times per year    Marital Status: Widowed    Family History  Problem Relation Age of Onset   Stroke Mother    Stroke Father    Arthritis Son    Hyperlipidemia Son    Hypertension Son    Colon cancer Neg Hx     Outpatient Encounter Medications as of 09/14/2022  Medication Sig   Multiple Vitamin (ONE-DAILY MULTI-VITAMIN PO) Take by mouth daily.   naproxen sodium (ALEVE) 220 MG tablet Take 220 mg by mouth. Patient states that he takes 2 by mouth every morning and 1 at bedtime   propranolol (INDERAL) 10 MG tablet Take 1 tablet (10 mg total) by mouth 3 (three) times daily.   rosuvastatin (CRESTOR) 10 MG tablet TAKE  ONE (1) TABLET BY MOUTH EVERY DAY   No facility-administered encounter medications on file as of 09/14/2022.    ALLERGIES: No Known Allergies  VACCINATION STATUS: Immunization History  Administered Date(s) Administered   Fluad Quad(high Dose 65+) 12/28/2018, 11/20/2019, 11/26/2020, 11/13/2021   Influenza, High Dose Seasonal PF 11/25/2015, 12/21/2016   Influenza-Unspecified 12/04/2017   Moderna SARS-COV2 Booster Vaccination 07/24/2020   Moderna Sars-Covid-2 Vaccination 05/01/2019, 05/29/2019, 12/25/2019, 07/24/2020, 12/07/2020   Pneumococcal Conjugate-13 09/09/2016   Pneumococcal Polysaccharide-23 04/21/2018    Diabetes He presents for his follow-up diabetic visit. He has type 2 diabetes mellitus. Onset time: Diagnosed in July at approx age of 19. His disease course has been stable. There are no hypoglycemic associated symptoms. There are no diabetic associated symptoms. There are no hypoglycemic complications. There are no diabetic complications. Risk factors for coronary artery disease include diabetes mellitus, dyslipidemia, family history, obesity, male sex and hypertension. Current diabetic treatment includes diet. He is compliant with treatment most of the time. His weight is fluctuating minimally. He is following a generally healthy diet. Meal planning includes avoidance of concentrated sweets. He has not had a previous visit with a dietitian. He participates in exercise daily. His home blood glucose trend is fluctuating minimally. His overall blood glucose range is 110-130 mg/dl. (He presents today, accompanied by his son, with his logs showing at target glycemic profile overall.  His POCT A1c today is 5.7%, unchanged from previous visit.  Analysis of his meter shows 7-day average of 119, 14-day average of 114, 30-day average of 117, 90-day average of 110.  He has done well with managing his glucose with diet and lifestyle changes only.) An ACE inhibitor/angiotensin II receptor blocker is  contraindicated. He does not see a podiatrist.Eye exam is current.     Review of systems  Constitutional: + Minimally fluctuating body weight, current Body mass index is 30.57 kg/m., no fatigue, no subjective hyperthermia, no subjective hypothermia Eyes: no blurry vision, no xerophthalmia ENT: no sore throat, no nodules palpated in throat, no dysphagia/odynophagia, no hoarseness Cardiovascular: no chest pain, no shortness of breath, no palpitations, no leg swelling Respiratory: no cough, no shortness of breath Gastrointestinal: no nausea/vomiting/diarrhea Musculoskeletal: no muscle/joint aches Skin: no rashes, no hyperemia Neurological: + essential tremor-chronic per patient, no numbness, no tingling, no dizziness Psychiatric: no depression, no anxiety  Objective:     BP Marland Kitchen)  142/80 (BP Location: Right Arm, Patient Position: Sitting, Cuff Size: Large) Comment: Retake with manuel cuff  Pulse (!) 49   Ht 5\' 11"  (1.803 m)   Wt 219 lb 3.2 oz (99.4 kg)   BMI 30.57 kg/m   Wt Readings from Last 3 Encounters:  09/14/22 219 lb 3.2 oz (99.4 kg)  06/08/22 217 lb (98.4 kg)  05/06/22 209 lb (94.8 kg)     BP Readings from Last 3 Encounters:  09/14/22 (!) 142/80  06/22/22 134/73  06/08/22 (!) 155/77     Physical Exam- Limited  Constitutional:  Body mass index is 30.57 kg/m. , not in acute distress, normal state of mind Eyes:  EOMI, no exophthalmos Musculoskeletal: no gross deformities, strength intact in all four extremities, no gross restriction of joint movements Skin:  no rashes, no hyperemia Neurological: + chronic essential tremor    CMP ( most recent) CMP     Component Value Date/Time   NA 142 12/31/2021 0827   K 5.1 12/31/2021 0827   CL 104 12/31/2021 0827   CO2 26 12/31/2021 0827   GLUCOSE 103 (H) 12/31/2021 0827   GLUCOSE 165 (H) 09/21/2021 1449   BUN 20 12/31/2021 0827   CREATININE 0.92 12/31/2021 0827   CALCIUM 9.5 12/31/2021 0827   PROT 6.6 12/31/2021 0827    ALBUMIN 4.4 12/31/2021 0827   AST 26 12/31/2021 0827   ALT 21 12/31/2021 0827   ALKPHOS 82 12/31/2021 0827   BILITOT 1.1 12/31/2021 0827   GFRNONAA >60 09/21/2021 1449   GFRAA 97 08/04/2019 0845     Diabetic Labs (most recent): Lab Results  Component Value Date   HGBA1C 5.7 (A) 09/14/2022   HGBA1C 5.7 (H) 06/08/2022   HGBA1C 5.7 (A) 03/16/2022     Lipid Panel ( most recent) Lipid Panel     Component Value Date/Time   CHOL 151 12/31/2021 0827   TRIG 77 12/31/2021 0827   HDL 48 12/31/2021 0827   CHOLHDL 3.1 12/31/2021 0827   LDLCALC 88 12/31/2021 0827   LABVLDL 15 12/31/2021 0827      Lab Results  Component Value Date   TSH 1.940 11/26/2020   TSH 2.660 08/04/2019   TSH 0.277 (L) 01/25/2019           Assessment & Plan:   1) Type 2 diabetes mellitus without complication, without long-term current use of insulin (HCC)  He presents today, accompanied by his son, with his logs showing at target glycemic profile overall.  His POCT A1c today is 5.7%, unchanged from previous visit.  Analysis of his meter shows 7-day average of 119, 14-day average of 114, 30-day average of 117, 90-day average of 110.  He has done well with managing his glucose with diet and lifestyle changes only.  - Robert Faulkner has currently uncontrolled symptomatic type 2 DM since 77 years of age.  -Recent labs reviewed.  - I had a long discussion with him about the progressive nature of diabetes and the pathology behind its complications. -his diabetes is not currently complicated but he remains at a high risk for more acute and chronic complications which include CAD, CVA, CKD, retinopathy, and neuropathy. These are all discussed in detail with him.  The following Lifestyle Medicine recommendations according to American College of Lifestyle Medicine [facility]) were discussed and offered to patient and he agrees to start the journey:  A. Whole Foods, Plant-based plate comprising of fruits and  vegetables, plant-based proteins, whole-grain carbohydrates was discussed in detail with the patient.  A list for source of those nutrients were also provided to the patient.  Patient will use only water or unsweetened tea for hydration. B.  The need to stay away from risky substances including alcohol, smoking; obtaining 7 to 9 hours of restorative sleep, at least 150 minutes of moderate intensity exercise weekly, the importance of healthy social connections,  and stress reduction techniques were discussed. C.  A full color page of  Calorie density of various food groups per pound showing examples of each food groups was provided to the patient.  - Nutritional counseling repeated at each appointment due to patients tendency to fall back in to old habits.  - The patient admits there is a room for improvement in their diet and drink choices. -  Suggestion is made for the patient to avoid simple carbohydrates from their diet including Cakes, Sweet Desserts / Pastries, Ice Cream, Soda (diet and regular), Sweet Tea, Candies, Chips, Cookies, Sweet Pastries, Store Bought Juices, Alcohol in Excess of 1-2 drinks a day, Artificial Sweeteners, Coffee Creamer, and "Sugar-free" Products. This will help patient to have stable blood glucose profile and potentially avoid unintended weight gain.   - I encouraged the patient to switch to unprocessed or minimally processed complex starch and increased protein intake (animal or plant source), fruits, and vegetables.   - Patient is advised to stick to a routine mealtimes to eat 3 meals a day and avoid unnecessary snacks (to snack only to correct hypoglycemia).  - I have approached him with the following individualized plan to manage his diabetes and patient agrees:    -He can stay off medications altogether for now given his great glucose control with diet/lifestyle changes alone.  Will see him back in 1 year or sooner, if needed.  -he can take a break from routine  glucose monitoring for now.    - Specific targets for  A1c; LDL, HDL, and Triglycerides were discussed with the patient.  2) Blood Pressure /Hypertension:  his blood pressure is controlled to target for his age.   he is advised to continue his current medications including Propanolol 10 mg p.o. daily with breakfast.  3) Lipids/Hyperlipidemia:    Review of his recent lipid panel from 12/31/21 showed controlled LDL 88.  he is advised to continue Crestor 10 mg daily at bedtime.  Side effects and precautions discussed with him.  4)  Weight/Diet:  his Body mass index is 30.57 kg/m.  -   he is NOT a candidate for weight loss.  Exercise, and detailed carbohydrates information provided  -  detailed on discharge instructions.  5) Chronic Care/Health Maintenance: -he is not on ACEI/ARB and is on Statin medications and is encouraged to initiate and continue to follow up with Ophthalmology, Dentist, Podiatrist at least yearly or according to recommendations, and advised to stay away from smoking. I have recommended yearly flu vaccine and pneumonia vaccine at least every 5 years; moderate intensity exercise for up to 150 minutes weekly; and sleep for at least 7 hours a day.  - he is advised to maintain close follow up with Raliegh Ip, DO for primary care needs, as well as his other providers for optimal and coordinated care.      I spent  21  minutes in the care of the patient today including review of labs from CMP, Lipids, Thyroid Function, Hematology (current and previous including abstractions from other facilities); face-to-face time discussing  his blood glucose readings/logs, discussing hypoglycemia and hyperglycemia episodes and symptoms,  medications doses, his options of short and long term treatment based on the latest standards of care / guidelines;  discussion about incorporating lifestyle medicine;  and documenting the encounter. Risk reduction counseling performed per USPSTF  guidelines to reduce obesity and cardiovascular risk factors.     Please refer to Patient Instructions for Blood Glucose Monitoring and Insulin/Medications Dosing Guide"  in media tab for additional information. Please  also refer to " Patient Self Inventory" in the Media  tab for reviewed elements of pertinent patient history.  Robert Faulkner participated in the discussions, expressed understanding, and voiced agreement with the above plans.  All questions were answered to his satisfaction. he is encouraged to contact clinic should he have any questions or concerns prior to his return visit.     Follow up plan: - Return in about 1 year (around 09/14/2023) for No previsit labs, Bring meter and logs.   Ronny Bacon, [facility] [address] [place], [place] [address] Phone: [phone] Fax: [phone]  09/14/2022, 8:38 AM

## 2022-10-19 ENCOUNTER — Encounter: Payer: Self-pay | Admitting: Family Medicine

## 2022-10-19 ENCOUNTER — Ambulatory Visit: Payer: Medicare Other | Admitting: Family Medicine

## 2022-10-19 VITALS — BP 132/76 | HR 50 | Temp 98.6°F | Ht 71.0 in | Wt 221.8 lb

## 2022-10-19 DIAGNOSIS — Z125 Encounter for screening for malignant neoplasm of prostate: Secondary | ICD-10-CM | POA: Diagnosis not present

## 2022-10-19 DIAGNOSIS — E1169 Type 2 diabetes mellitus with other specified complication: Secondary | ICD-10-CM

## 2022-10-19 DIAGNOSIS — E785 Hyperlipidemia, unspecified: Secondary | ICD-10-CM

## 2022-10-19 DIAGNOSIS — E1159 Type 2 diabetes mellitus with other circulatory complications: Secondary | ICD-10-CM

## 2022-10-19 DIAGNOSIS — E119 Type 2 diabetes mellitus without complications: Secondary | ICD-10-CM | POA: Diagnosis not present

## 2022-10-19 DIAGNOSIS — Z130 Encounter for screening for diseases of the blood and blood-forming organs and certain disorders involving the immune mechanism: Secondary | ICD-10-CM

## 2022-10-19 DIAGNOSIS — I152 Hypertension secondary to endocrine disorders: Secondary | ICD-10-CM

## 2022-10-19 DIAGNOSIS — Z23 Encounter for immunization: Secondary | ICD-10-CM

## 2022-10-19 NOTE — Patient Instructions (Signed)

## 2022-10-19 NOTE — Addendum Note (Signed)
Addended by: Waynette Buttery on: 10/19/2022 09:13 AM   Modules accepted: Orders

## 2022-10-19 NOTE — Progress Notes (Signed)
Subjective: CC:DM PCP: Robert Ip, DO [id number] Robert Faulkner is a 77 y.o. male presenting to clinic today for:  1. Type 2 Diabetes with hypertension, hyperlipidemia:  Compliant with a carb restricted diet.  Stays active as tolerated.  Saw endocrinology last month and A1c remained under excellent control through diet alone.  He is compliant with his statin.  He takes propranolol primarily for tremor.  Diabetes Health Maintenance Due  Topic Date Due   FOOT EXAM  10/02/2022   OPHTHALMOLOGY EXAM  10/14/2022   HEMOGLOBIN A1C  03/17/2023    Last A1c:  Lab Results  Component Value Date   HGBA1C 5.7 (A) 09/14/2022    ROS: Denies dizziness, LOC, polyuria, polydipsia, unintended weight loss/gain, foot ulcerations, numbness or tingling in extremities, shortness of breath or chest pain.   ROS: Per HPI  No Known Allergies Past Medical History:  Diagnosis Date   Diabetes mellitus without complication (HCC)    Essential tremor    Hypertension    Tremors of nervous system     Current Outpatient Medications:    Multiple Vitamin (ONE-DAILY MULTI-VITAMIN PO), Take by mouth daily., Disp: , Rfl:    naproxen sodium (ALEVE) 220 MG tablet, Take 220 mg by mouth. Patient states that he takes 2 by mouth every morning and 1 at bedtime, Disp: , Rfl:    propranolol (INDERAL) 10 MG tablet, Take 1 tablet (10 mg total) by mouth 3 (three) times daily., Disp: 300 tablet, Rfl: 3   rosuvastatin (CRESTOR) 10 MG tablet, TAKE ONE (1) TABLET BY MOUTH EVERY DAY, Disp: 100 tablet, Rfl: 3 Social History   Socioeconomic History   Marital status: Widowed    Spouse name: Not on file   Number of children: 3   Years of education: Not on file   Highest education level: 8th grade  Occupational History   Occupation: retired    Comment: [employer]  Tobacco Use   Smoking status: Former    Current packs/day: 0.00    Average packs/day: 2.0 packs/day for 11.0 years (22.0 ttl pk-yrs)     Types: Cigarettes    Start date: 09/21/1966    Quit date: 09/20/1977    Years since quitting: 45.1   Smokeless tobacco: Never  Vaping Use   Vaping status: Never Used  Substance and Sexual Activity   Alcohol use: No   Drug use: No   Sexual activity: Not on file  Other Topics Concern   Not on file  Social History Narrative   Lives in 2 story home alone - wife passed in 2022 - his children live nearby and visit often   He's involved in church   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, cuts wood, always working)   Social Determinants of Health   Financial Resource Strain: Low Risk  (06/07/2022)   Overall Financial Resource Strain (CARDIA)    Difficulty of Paying Living Expenses: Not hard at all  Food Insecurity: No Food Insecurity (06/07/2022)   Hunger Vital Sign    Worried About Running Out of Food in the Last Year: Never true    Ran Out of Food in the Last Year: Never true  Transportation Needs: No Transportation Needs (06/07/2022)   PRAPARE - Administrator, Civil Service (Medical): No    Lack of Transportation (Non-Medical): No  Physical Activity: Sufficiently Active (06/07/2022)   Exercise Vital Sign    Days of Exercise per Week: 4 days    Minutes of Exercise  per Session: 40 min  Recent Concern: Physical Activity - Insufficiently Active (05/06/2022)   Exercise Vital Sign    Days of Exercise per Week: 3 days    Minutes of Exercise per Session: 30 min  Stress: No Stress Concern Present (06/07/2022)   [employer] of Occupational Health - Occupational Stress Questionnaire    Feeling of Stress : Not at all  Social Connections: Moderately Integrated (06/07/2022)   Social Connection and Isolation Panel [NHANES]    Frequency of Communication with Friends and Family: More than three times a week    Frequency of Social Gatherings with Friends and Family: More than three times a week    Attends Religious Services: More than 4 times per year    Active Member of  [employer] or Organizations: Yes    Attends Banker Meetings: More than 4 times per year    Marital Status: Widowed  Intimate Partner Violence: Not At Risk (05/06/2022)   Humiliation, Afraid, Rape, and Kick questionnaire    Fear of Current or Ex-Partner: No    Emotionally Abused: No    Physically Abused: No    Sexually Abused: No   Family History  Problem Relation Age of Onset   Stroke Mother    Stroke Father    Arthritis Son    Hyperlipidemia Son    Hypertension Son    Colon cancer Neg Hx     Objective: Office vital signs reviewed. BP 132/76   Pulse (!) 50   Temp 98.6 F (37 C)   Ht 5\' 11"  (1.803 m)   Wt 221 lb 12.8 oz (100.6 kg)   SpO2 95%   BMI 30.93 kg/m   Physical Examination:  General: Awake, alert, well nourished, No acute distress HEENT:  sclera white, MMM Cardio: regular rate and rhythm, S1S2 heard, no murmurs appreciated Pulm: clear to auscultation bilaterally, no wheezes, rhonchi or rales; normal work of breathing on room air Extremities: warm, well perfused, No edema, cyanosis or clubbing; +2 pulses bilaterally Neuro: hard of hearing.  Diabetic Foot Exam - Simple   Simple Foot Form Diabetic Foot exam was performed with the following findings: Yes 10/19/2022  8:07 AM  Visual Inspection No deformities, no ulcerations, no other skin breakdown bilaterally: Yes Sensation Testing See comments: Yes Pulse Check Posterior Tibialis and Dorsalis pulse intact bilaterally: Yes Comments Absent monofilament sensation in R digits 1,3,5; L digits 2-4.      Assessment/ Plan: 77 y.o. male   Diet-controlled diabetes mellitus (HCC) - Plan: CMP14+EGFR, Microalbumin / creatinine urine ratio, CANCELED: Bayer DCA Hb A1c Waived  Hyperlipidemia associated with type 2 diabetes mellitus (HCC) - Plan: CMP14+EGFR, Lipid Panel  Hypertension associated with diabetes (HCC) - Plan: CMP14+EGFR  Screening, anemia, deficiency, iron - Plan: CBC  Screening for malignant  neoplasm of prostate - Plan: PSA  Sugar controlled.  Foot exam shows absent monofilament sensation in several digits, reinforced home foot exams.  Check urine micro  Fasting labs collected. Continue statin  BP diet controlled. On BB for tremor  Screening CBC and PSA ordered.  Shingrix #1 administered   Robert Ip, DO [facility] [phone]

## 2022-10-19 NOTE — Addendum Note (Signed)
Addended by: Waynette Buttery on: 10/19/2022 08:45 AM   Modules accepted: Orders

## 2022-10-20 ENCOUNTER — Other Ambulatory Visit: Payer: Self-pay | Admitting: Family Medicine

## 2022-10-20 DIAGNOSIS — E1169 Type 2 diabetes mellitus with other specified complication: Secondary | ICD-10-CM

## 2022-10-20 LAB — CBC
Hematocrit: 42.5 % (ref 37.5–51.0)
Hemoglobin: 14.7 g/dL (ref 13.0–17.7)
MCH: 31.3 pg (ref 26.6–33.0)
MCHC: 34.6 g/dL (ref 31.5–35.7)
MCV: 91 fL (ref 79–97)
Platelets: 156 10*3/uL (ref 150–450)
RBC: 4.69 x10E6/uL (ref 4.14–5.80)
RDW: 12.4 % (ref 11.6–15.4)
WBC: 7 10*3/uL (ref 3.4–10.8)

## 2022-10-20 LAB — LIPID PANEL
Chol/HDL Ratio: 5.8 ratio — ABNORMAL HIGH (ref 0.0–5.0)
Cholesterol, Total: 222 mg/dL — ABNORMAL HIGH (ref 100–199)
HDL: 38 mg/dL — ABNORMAL LOW (ref >39)
LDL Chol Calc (NIH): 150 mg/dL — ABNORMAL HIGH (ref 0–99)
Triglycerides: 187 mg/dL — ABNORMAL HIGH (ref 0–149)
VLDL Cholesterol Cal: 34 mg/dL (ref 5–40)

## 2022-10-20 LAB — CMP14+EGFR
ALT: 25 IU/L (ref 0–44)
AST: 26 IU/L (ref 0–40)
Albumin: 4.1 g/dL (ref 3.8–4.8)
Alkaline Phosphatase: 78 IU/L (ref 44–121)
BUN/Creatinine Ratio: 15 (ref 10–24)
BUN: 16 mg/dL (ref 8–27)
Bilirubin Total: 0.9 mg/dL (ref 0.0–1.2)
CO2: 25 mmol/L (ref 20–29)
Calcium: 9.4 mg/dL (ref 8.6–10.2)
Chloride: 102 mmol/L (ref 96–106)
Creatinine, Ser: 1.05 mg/dL (ref 0.76–1.27)
Globulin, Total: 2.3 g/dL (ref 1.5–4.5)
Glucose: 113 mg/dL — ABNORMAL HIGH (ref 70–99)
Potassium: 4.8 mmol/L (ref 3.5–5.2)
Sodium: 138 mmol/L (ref 134–144)
Total Protein: 6.4 g/dL (ref 6.0–8.5)
eGFR: 72 mL/min/{1.73_m2} (ref >59)

## 2022-10-20 LAB — MICROALBUMIN / CREATININE URINE RATIO
Creatinine, Urine: 127.1 mg/dL
Microalb/Creat Ratio: 7 mg/g{creat} (ref 0–29)
Microalbumin, Urine: 8.5 ug/mL

## 2022-10-20 LAB — PSA: Prostate Specific Ag, Serum: 0.8 ng/mL (ref 0.0–4.0)

## 2022-10-20 MED ORDER — EZETIMIBE 10 MG PO TABS: 10.0000 mg | ORAL_TABLET | Freq: Every day | ORAL | 3 refills | Status: DC

## 2022-12-02 ENCOUNTER — Ambulatory Visit (INDEPENDENT_AMBULATORY_CARE_PROVIDER_SITE_OTHER): Payer: Medicare Other

## 2022-12-02 DIAGNOSIS — Z23 Encounter for immunization: Secondary | ICD-10-CM | POA: Diagnosis not present

## 2023-01-18 ENCOUNTER — Other Ambulatory Visit: Payer: Medicare Other

## 2023-01-18 DIAGNOSIS — E1169 Type 2 diabetes mellitus with other specified complication: Secondary | ICD-10-CM | POA: Diagnosis not present

## 2023-01-18 DIAGNOSIS — E785 Hyperlipidemia, unspecified: Secondary | ICD-10-CM | POA: Diagnosis not present

## 2023-01-19 LAB — LIPID PANEL
Chol/HDL Ratio: 5.8 ratio — ABNORMAL HIGH (ref 0.0–5.0)
Cholesterol, Total: 213 mg/dL — ABNORMAL HIGH (ref 100–199)
HDL: 37 mg/dL — ABNORMAL LOW
LDL Chol Calc (NIH): 141 mg/dL — ABNORMAL HIGH (ref 0–99)
Triglycerides: 193 mg/dL — ABNORMAL HIGH (ref 0–149)
VLDL Cholesterol Cal: 35 mg/dL (ref 5–40)

## 2023-01-19 LAB — HEPATIC FUNCTION PANEL
ALT: 30 [IU]/L (ref 0–44)
AST: 29 [IU]/L (ref 0–40)
Albumin: 4.5 g/dL (ref 3.8–4.8)
Alkaline Phosphatase: 81 [IU]/L (ref 44–121)
Bilirubin Total: 1.4 mg/dL — ABNORMAL HIGH (ref 0.0–1.2)
Bilirubin, Direct: 0.33 mg/dL (ref 0.00–0.40)
Total Protein: 6.7 g/dL (ref 6.0–8.5)

## 2023-01-19 MED ORDER — ROSUVASTATIN CALCIUM 20 MG PO TABS: 20.0000 mg | ORAL_TABLET | Freq: Every day | ORAL | 3 refills | Status: DC

## 2023-04-03 DIAGNOSIS — Z20822 Contact with and (suspected) exposure to covid-19: Secondary | ICD-10-CM | POA: Diagnosis not present

## 2023-04-03 DIAGNOSIS — R0989 Other specified symptoms and signs involving the circulatory and respiratory systems: Secondary | ICD-10-CM | POA: Diagnosis not present

## 2023-04-03 DIAGNOSIS — J40 Bronchitis, not specified as acute or chronic: Secondary | ICD-10-CM | POA: Diagnosis not present

## 2023-04-03 DIAGNOSIS — R059 Cough, unspecified: Secondary | ICD-10-CM | POA: Diagnosis not present

## 2023-04-03 DIAGNOSIS — J210 Acute bronchiolitis due to respiratory syncytial virus: Secondary | ICD-10-CM | POA: Diagnosis not present

## 2023-04-12 ENCOUNTER — Telehealth: Payer: Self-pay

## 2023-04-12 NOTE — Telephone Encounter (Signed)
I did offer dod , but pt declined ask to be seen by pcp

## 2023-04-12 NOTE — Telephone Encounter (Signed)
Pt called in and stated he is having a lot of trouble ever since he got home from the urgent care after testing positive for rsv , he states they done a breathing treatment there that really helped him but he is still having trouble breathing , very congested I made him a apt with [place] Wednesday morning , advised him if he starts having trouble breathing or feeling worse to call 911 or go to the er , I told him I would send a message to you and let you know what is going on

## 2023-04-12 NOTE — Telephone Encounter (Signed)
Thank you for the update, please do offer him DOD if he wants to be seen sooner tomorrow.

## 2023-04-13 ENCOUNTER — Ambulatory Visit: Payer: Medicare Other | Admitting: Family Medicine

## 2023-04-13 ENCOUNTER — Encounter: Payer: Self-pay | Admitting: Family Medicine

## 2023-04-13 ENCOUNTER — Ambulatory Visit (INDEPENDENT_AMBULATORY_CARE_PROVIDER_SITE_OTHER): Payer: Medicare Other

## 2023-04-13 VITALS — BP 122/76 | HR 100 | Temp 98.7°F | Ht 71.0 in | Wt 222.0 lb

## 2023-04-13 DIAGNOSIS — B9689 Other specified bacterial agents as the cause of diseases classified elsewhere: Secondary | ICD-10-CM | POA: Diagnosis not present

## 2023-04-13 DIAGNOSIS — J205 Acute bronchitis due to respiratory syncytial virus: Secondary | ICD-10-CM

## 2023-04-13 DIAGNOSIS — H6123 Impacted cerumen, bilateral: Secondary | ICD-10-CM

## 2023-04-13 DIAGNOSIS — R0602 Shortness of breath: Secondary | ICD-10-CM | POA: Diagnosis not present

## 2023-04-13 MED ORDER — CEFDINIR 300 MG PO CAPS: 300.0000 mg | ORAL_CAPSULE | Freq: Two times a day (BID) | ORAL | 0 refills | Status: DC

## 2023-04-13 MED ORDER — ALBUTEROL SULFATE (2.5 MG/3ML) 0.083% IN NEBU
2.5000 mg | INHALATION_SOLUTION | Freq: Once | RESPIRATORY_TRACT | Status: AC
  Administered 2023-04-13: 2.5 mg via RESPIRATORY_TRACT

## 2023-04-13 MED ORDER — IPRATROPIUM-ALBUTEROL 0.5-2.5 (3) MG/3ML IN SOLN: 3.0000 mL | Freq: Once | RESPIRATORY_TRACT | Status: DC

## 2023-04-13 NOTE — Progress Notes (Signed)
Subjective: CC: Follow-up RSV bronchitis PCP: Raliegh Ip, DO ZOX:[id number] Robert Faulkner is a 78 y.o. male presenting to clinic today for:  1.  RSV bronchitis Patient is accompanied by his son today.  He was diagnosed with RSV about 2 weeks ago.  He was treated with nebulizer in the office and then discharged with albuterol and prednisone Dosepak.  He notes that today is the first good day that he has had.  He has had cough that is been productive and as of late it had brown sputum.  He denies hemoptysis.  He is not reporting any shortness of breath but has felt pretty puny over the last 2 weeks.  No measured fevers.  No chest pain reported.  ROS: Per HPI  No Known Allergies Past Medical History:  Diagnosis Date   Diabetes mellitus without complication (HCC)    Essential tremor    Hypertension    Tremors of nervous system     Current Outpatient Medications:    ezetimibe (ZETIA) 10 MG tablet, Take 1 tablet (10 mg total) by mouth daily., Disp: 90 tablet, Rfl: 3   Multiple Vitamin (ONE-DAILY MULTI-VITAMIN PO), Take by mouth daily., Disp: , Rfl:    naproxen sodium (ALEVE) 220 MG tablet, Take 220 mg by mouth. Patient states that he takes 2 by mouth every morning and 1 at bedtime, Disp: , Rfl:    propranolol (INDERAL) 10 MG tablet, Take 1 tablet (10 mg total) by mouth 3 (three) times daily., Disp: 300 tablet, Rfl: 3   rosuvastatin (CRESTOR) 20 MG tablet, Take 1 tablet (20 mg total) by mouth daily. Dose change, Disp: 90 tablet, Rfl: 3 Social History   Socioeconomic History   Marital status: Widowed    Spouse name: Not on file   Number of children: 3   Years of education: Not on file   Highest education level: 8th grade  Occupational History   Occupation: retired    Comment: [employer]  Tobacco Use   Smoking status: Former    Current packs/day: 0.00    Average packs/day: 2.0 packs/day for 11.0 years (22.0 ttl pk-yrs)    Types: Cigarettes    Start date:  09/21/1966    Quit date: 09/20/1977    Years since quitting: 45.5   Smokeless tobacco: Never  Vaping Use   Vaping status: Never Used  Substance and Sexual Activity   Alcohol use: No   Drug use: No   Sexual activity: Not on file  Other Topics Concern   Not on file  Social History Narrative   Lives in 2 story home alone - wife passed in 2022 - his children live nearby and visit often   He's involved in church   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, cuts wood, always working)   Social Drivers of Corporate investment banker Strain: Low Risk  (06/07/2022)   Overall Financial Resource Strain (CARDIA)    Difficulty of Paying Living Expenses: Not hard at all  Food Insecurity: No Food Insecurity (06/07/2022)   Hunger Vital Sign    Worried About Running Out of Food in the Last Year: Never true    Ran Out of Food in the Last Year: Never true  Transportation Needs: No Transportation Needs (06/07/2022)   PRAPARE - Administrator, Civil Service (Medical): No    Lack of Transportation (Non-Medical): No  Physical Activity: Sufficiently Active (06/07/2022)   Exercise Vital Sign    Days of Exercise  per Week: 4 days    Minutes of Exercise per Session: 40 min  Recent Concern: Physical Activity - Insufficiently Active (05/06/2022)   Exercise Vital Sign    Days of Exercise per Week: 3 days    Minutes of Exercise per Session: 30 min  Stress: No Stress Concern Present (06/07/2022)   [employer] of Occupational Health - Occupational Stress Questionnaire    Feeling of Stress : Not at all  Social Connections: Moderately Integrated (06/07/2022)   Social Connection and Isolation Panel [NHANES]    Frequency of Communication with Friends and Family: More than three times a week    Frequency of Social Gatherings with Friends and Family: More than three times a week    Attends Religious Services: More than 4 times per year    Active Member of [employer] or Organizations: Yes    Attends  Banker Meetings: More than 4 times per year    Marital Status: Widowed  Intimate Partner Violence: Not At Risk (05/06/2022)   Humiliation, Afraid, Rape, and Kick questionnaire    Fear of Current or Ex-Partner: No    Emotionally Abused: No    Physically Abused: No    Sexually Abused: No   Family History  Problem Relation Age of Onset   Stroke Mother    Stroke Father    Arthritis Son    Hyperlipidemia Son    Hypertension Son    Colon cancer Neg Hx     Objective: Office vital signs reviewed. BP 122/76   Pulse 100   Temp 98.7 F (37.1 C)   Ht 5\' 11"  (1.803 m)   Wt 222 lb (100.7 kg)   SpO2 93%   BMI 30.96 kg/m   Physical Examination:  General: Awake, alert, nontoxic elderly male, No acute distress HEENT: Sclera white.  TMs obscured by cerumen bilaterally Cardio: regular rate and rhythm, S1S2 heard, no murmurs appreciated Pulm: clear to auscultation bilaterally, no wheezes, rhonchi or rales; normal work of breathing on room air  Assessment/ Plan: 78 y.o. male   RSV bronchitis - Plan: DG Chest 2 View  Acute bacterial bronchitis - Plan: cefdinir (OMNICEF) 300 MG capsule  Bilateral impacted cerumen  Personal review of chest x-ray demonstrated no acute pulmonary infiltrates to suggest secondary bacterial pneumonia but given brown sputum I do have concerns about secondary bacterial bronchitis and therefore going to empirically treat him with Omnicef.  He will continue his albuterol as needed.  He is given a albuterol nebulizer here in office.  His pulmonary exam was fairly unremarkable except for intermittent coughing.  I encouraged use of Mucinex plain with plenty of water in efforts to get the mucus out of the chest.  We discussed red flag signs and symptoms warranting further evaluation.  Debrox recommended for bilateral impacted cerumen and he may return at a later date to have these irrigated if needed   Raliegh Ip, DO [facility] [phone]

## 2023-04-14 ENCOUNTER — Encounter: Payer: Self-pay | Admitting: Family Medicine

## 2023-04-14 ENCOUNTER — Ambulatory Visit: Payer: Medicare Other | Admitting: Family Medicine

## 2023-04-26 ENCOUNTER — Ambulatory Visit (INDEPENDENT_AMBULATORY_CARE_PROVIDER_SITE_OTHER)

## 2023-04-26 ENCOUNTER — Ambulatory Visit (INDEPENDENT_AMBULATORY_CARE_PROVIDER_SITE_OTHER): Payer: Medicare Other | Admitting: Family Medicine

## 2023-04-26 VITALS — BP 129/74 | HR 60 | Temp 98.3°F | Ht 71.0 in | Wt 224.0 lb

## 2023-04-26 DIAGNOSIS — Z0001 Encounter for general adult medical examination with abnormal findings: Secondary | ICD-10-CM

## 2023-04-26 DIAGNOSIS — E785 Hyperlipidemia, unspecified: Secondary | ICD-10-CM

## 2023-04-26 DIAGNOSIS — I152 Hypertension secondary to endocrine disorders: Secondary | ICD-10-CM | POA: Diagnosis not present

## 2023-04-26 DIAGNOSIS — Z23 Encounter for immunization: Secondary | ICD-10-CM

## 2023-04-26 DIAGNOSIS — E1169 Type 2 diabetes mellitus with other specified complication: Secondary | ICD-10-CM

## 2023-04-26 DIAGNOSIS — Z0000 Encounter for general adult medical examination without abnormal findings: Secondary | ICD-10-CM

## 2023-04-26 DIAGNOSIS — E1159 Type 2 diabetes mellitus with other circulatory complications: Secondary | ICD-10-CM | POA: Diagnosis not present

## 2023-04-26 DIAGNOSIS — G250 Essential tremor: Secondary | ICD-10-CM | POA: Diagnosis not present

## 2023-04-26 LAB — BAYER DCA HB A1C WAIVED: HB A1C (BAYER DCA - WAIVED): 6.3 % — ABNORMAL HIGH (ref 4.8–5.6)

## 2023-04-26 LAB — HM DIABETES EYE EXAM

## 2023-04-26 MED ORDER — EZETIMIBE 10 MG PO TABS: 10.0000 mg | ORAL_TABLET | Freq: Every day | ORAL | 3 refills | Status: DC

## 2023-04-26 MED ORDER — ROSUVASTATIN CALCIUM 20 MG PO TABS: 20.0000 mg | ORAL_TABLET | Freq: Every day | ORAL | 3 refills | Status: DC

## 2023-04-26 MED ORDER — PROPRANOLOL HCL 10 MG PO TABS: 10.0000 mg | ORAL_TABLET | Freq: Three times a day (TID) | ORAL | 3 refills | Status: DC

## 2023-04-26 NOTE — Progress Notes (Signed)
 Robert Faulkner arrived 04/26/2023 and has given verbal consent to obtain images and complete their overdue diabetic retinal screening.  The images have been sent to an ophthalmologist or optometrist for review and interpretation.  Results will be sent back to Raliegh Ip, DO for review.  Patient has been informed they will be contacted when we receive the results via telephone or MyChart

## 2023-04-26 NOTE — Patient Instructions (Addendum)
 Seborrheic Keratosis A seborrheic keratosis is a common, noncancerous (benign) skin growth. These growths are velvety, waxy, or rough spots that appear on the skin. They are often tan, brown, or black. The skin growths can be flat or raised and may be scaly. What are the causes? The cause of this condition is not known. What increases the risk? You are more likely to develop this condition if you: Have a family history of seborrheic keratosis. Are 80 years old or older. Are pregnant. Have had estrogen replacement therapy. What are the signs or symptoms? Symptoms of this condition include growths on the face, chest, shoulders, back, or other areas. These growths: Are usually painless, but may become irritated and itchy. Can be tan, yellow, brown, black, or other colors. Are slightly raised or have a flat surface. Are sometimes rough or wart-like in texture. Are often velvety or waxy on the surface. Are round or oval-shaped. Often occur in groups, but may occur as a single growth. How is this diagnosed? This condition is diagnosed with a medical history and physical exam. A sample of the growth may be tested (skin biopsy). You may also need to see a skin specialist (dermatologist). How is this treated? Treatment is not usually needed for this condition unless the growths are irritated or bleed often. You may also choose to have the growths removed if you do not like their appearance. Growth removal may include a procedure in which: Liquid nitrogen is applied to "freeze" off the growth (cryosurgery). This is the most common procedure. The growth is burned off with electricity (electrocautery). The growth is removed by scraping (curettage). Follow these instructions at home: Watch your growth or growths for any changes. Do not scratch or pick at the growth or growths. This can cause them to become irritated or infected. Contact a health care provider if: You suddenly have many new  growths. Your growth bleeds, itches, or hurts. Your growth suddenly becomes larger or changes color. Summary A seborrheic keratosis is a common, noncancerous skin growth. Treatment is not usually needed for this condition unless the growths are irritated or bleed often. Watch your growth or growths for any changes. Contact a health care provider if you suddenly have many new growths or your growth suddenly becomes larger or changes color. This information is not intended to replace advice given to you by your health care provider. Make sure you discuss any questions you have with your health care provider. Document Revised: 04/25/2021 Document Reviewed: 04/25/2021 Elsevier Patient Education  2024 [employer].

## 2023-04-26 NOTE — Progress Notes (Signed)
 Robert Faulkner is a 78 y.o. male who is accompanied today's visit by his son.  He presents to office today for annual physical exam examination.    Concerns today include: 1. Type 2 Diabetes with hypertension, hyperlipidemia:  Patient is compliant with his cholesterol medicines.  He has been tolerating the increased dose of rosuvastatin without difficulty.  He is fasting this morning.  He has had diet-controlled diabetes and reports no hypoglycemic episodes.  Has monitoring equipment at home if needed.  Compliant with propranolol which is also used for his essential tremor  Last eye exam: needs Last foot exam: UTD Last A1c:  Lab Results  Component Value Date   HGBA1C 5.7 (A) 09/14/2022   Nephropathy screen indicated?: UTD Last flu, zoster and/or pneumovax:  Immunization History  Administered Date(s) Administered   Fluad Quad(high Dose 65+) 12/28/2018, 11/20/2019, 11/26/2020, 11/13/2021   Fluad Trivalent(High Dose 65+) 12/02/2022   Influenza, High Dose Seasonal PF 11/25/2015, 12/21/2016   Influenza-Unspecified 12/04/2017   Moderna SARS-COV2 Booster Vaccination 07/24/2020   Moderna Sars-Covid-2 Vaccination 05/01/2019, 05/29/2019, 12/25/2019, 07/24/2020, 12/07/2020   Pneumococcal Conjugate-13 09/09/2016   Pneumococcal Polysaccharide-23 04/21/2018   Tdap 05/06/2021   Zoster Recombinant(Shingrix) 10/19/2022    ROS: Denies dizziness, LOC, polyuria, polydipsia, unintended weight loss/gain, foot ulcerations, numbness or tingling in extremities, shortness of breath or chest pain.  Reports resolution of URI that he was seen for a few weeks ago   Occupation: Retired, Marital status: Widowed, Substance use: None Health Maintenance Due  Topic Date Due   OPHTHALMOLOGY EXAM  10/14/2022   Zoster Vaccines- Shingrix (2 of 2) 12/14/2022   HEMOGLOBIN A1C  03/17/2023   Medicare Annual Wellness (AWV)  05/06/2023   Refills needed today: All  Immunization History  Administered Date(s)  Administered   Fluad Quad(high Dose 65+) 12/28/2018, 11/20/2019, 11/26/2020, 11/13/2021   Fluad Trivalent(High Dose 65+) 12/02/2022   Influenza, High Dose Seasonal PF 11/25/2015, 12/21/2016   Influenza-Unspecified 12/04/2017   Moderna SARS-COV2 Booster Vaccination 07/24/2020   Moderna Sars-Covid-2 Vaccination 05/01/2019, 05/29/2019, 12/25/2019, 07/24/2020, 12/07/2020   Pneumococcal Conjugate-13 09/09/2016   Pneumococcal Polysaccharide-23 04/21/2018   Tdap 05/06/2021   Zoster Recombinant(Shingrix) 10/19/2022   Past Medical History:  Diagnosis Date   Diabetes mellitus without complication (HCC)    Essential tremor    Hypertension    Tremors of nervous system    Social History   Socioeconomic History   Marital status: Widowed    Spouse name: Not on file   Number of children: 3   Years of education: Not on file   Highest education level: 8th grade  Occupational History   Occupation: retired    Comment: [employer]  Tobacco Use   Smoking status: Former    Current packs/day: 0.00    Average packs/day: 2.0 packs/day for 11.0 years (22.0 ttl pk-yrs)    Types: Cigarettes    Start date: 09/21/1966    Quit date: 09/20/1977    Years since quitting: 45.6   Smokeless tobacco: Never  Vaping Use   Vaping status: Never Used  Substance and Sexual Activity   Alcohol use: No   Drug use: No   Sexual activity: Not on file  Other Topics Concern   Not on file  Social History Narrative   Lives in 2 story home alone - wife passed in 2022 - his children live nearby and visit often   He's involved in church   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, cuts wood, always working)  Social Drivers of Corporate investment banker Strain: Low Risk  (04/26/2023)   Overall Financial Resource Strain (CARDIA)    Difficulty of Paying Living Expenses: Not hard at all  Food Insecurity: No Food Insecurity (04/26/2023)   Hunger Vital Sign    Worried About Running Out of Food in the  Last Year: Never true    Ran Out of Food in the Last Year: Never true  Transportation Needs: No Transportation Needs (04/26/2023)   PRAPARE - Administrator, Civil Service (Medical): No    Lack of Transportation (Non-Medical): No  Physical Activity: Sufficiently Active (04/26/2023)   Exercise Vital Sign    Days of Exercise per Week: 4 days    Minutes of Exercise per Session: 40 min  Stress: No Stress Concern Present (04/26/2023)   [employer] of Occupational Health - Occupational Stress Questionnaire    Feeling of Stress : Not at all  Social Connections: Moderately Integrated (04/26/2023)   Social Connection and Isolation Panel [NHANES]    Frequency of Communication with Friends and Family: More than three times a week    Frequency of Social Gatherings with Friends and Family: More than three times a week    Attends Religious Services: More than 4 times per year    Active Member of [employer] or Organizations: Yes    Attends Banker Meetings: More than 4 times per year    Marital Status: Widowed  Intimate Partner Violence: Not At Risk (05/06/2022)   Humiliation, Afraid, Rape, and Kick questionnaire    Fear of Current or Ex-Partner: No    Emotionally Abused: No    Physically Abused: No    Sexually Abused: No   Past Surgical History:  Procedure Laterality Date   COLONOSCOPY N/A 09/21/2014   Procedure: COLONOSCOPY;  Surgeon: West Bali, MD;  Location: [facility];  Service: Endoscopy;  Laterality: N/A;  1200 - moved to 10:30 - office to notify   Family History  Problem Relation Age of Onset   Stroke Mother    Stroke Father    Arthritis Son    Hyperlipidemia Son    Hypertension Son    Colon cancer Neg Hx     Current Outpatient Medications:    Multiple Vitamin (ONE-DAILY MULTI-VITAMIN PO), Take by mouth daily., Disp: , Rfl:    naproxen sodium (ALEVE) 220 MG tablet, Take 220 mg by mouth. Patient states that he takes 2 by mouth every morning and 1 at  bedtime, Disp: , Rfl:    ezetimibe (ZETIA) 10 MG tablet, Take 1 tablet (10 mg total) by mouth daily., Disp: 100 tablet, Rfl: 3   propranolol (INDERAL) 10 MG tablet, Take 1 tablet (10 mg total) by mouth 3 (three) times daily., Disp: 300 tablet, Rfl: 3   rosuvastatin (CRESTOR) 20 MG tablet, Take 1 tablet (20 mg total) by mouth daily. Dose change, Disp: 100 tablet, Rfl: 3  No Known Allergies   ROS: Review of Systems A comprehensive review of systems was negative except for: Eyes: positive for contacts/glasses Integument/breast: positive for multiple skin lesions.  [facility]    Physical exam BP 129/74   Pulse 60   Temp 98.3 F (36.8 C)   Ht 5\' 11"  (1.803 m)   Wt 224 lb (101.6 kg)   SpO2 94%   BMI 31.24 kg/m  General appearance: alert, cooperative, appears stated age, no distress, and mildly obese Head: Normocephalic, without obvious abnormality, atraumatic Eyes: negative  findings: lids and lashes normal, conjunctivae and sclerae normal, corneas clear, and pupils equal, round, reactive to light and accomodation Ears: normal TM's and external ear canals both ears Nose: Nares normal. Septum midline. Mucosa normal. No drainage or sinus tenderness. Throat: lips, mucosa, and tongue normal; teeth and gums normal Neck: no adenopathy, supple, symmetrical, trachea midline, and thyroid not enlarged, symmetric, no tenderness/mass/nodules Back: symmetric, no curvature. ROM normal. No CVA tenderness. Lungs: clear to auscultation bilaterally Chest wall: no tenderness Heart: regular rate and rhythm, S1, S2 normal, no murmur, click, rub or gallop Abdomen: soft, non-tender; bowel sounds normal; no masses,  no organomegaly Extremities: extremities normal, atraumatic, no cyanosis or edema Pulses: 2+ and symmetric Skin:  Multiple pigmented skin lesions along the upper extremities.  He has several seborrheic keratoses along the upper extremities, trunk.  Few actinic keratosis  noted along the forearm as well Lymph nodes: Cervical, supraclavicular, and axillary nodes normal. Neurologic: Grossly normal      04/26/2023   10:55 AM 04/13/2023    3:54 PM 10/19/2022    7:52 AM  Depression screen PHQ 2/9  Decreased Interest 0 0 0  Down, Depressed, Hopeless 0 0 0  PHQ - 2 Score 0 0 0  Altered sleeping 0 0 0  Tired, decreased energy 0 0 0  Change in appetite 0 0 0  Feeling bad or failure about yourself  0 0 0  Trouble concentrating 0 0 0  Moving slowly or fidgety/restless 0 0 0  Suicidal thoughts 0 0 0  PHQ-9 Score 0 0 0  Difficult doing work/chores Not difficult at all Not difficult at all Not difficult at all      04/26/2023   10:54 AM 04/13/2023    3:54 PM 10/19/2022    7:52 AM 12/30/2021   10:14 AM  GAD 7 : Generalized Anxiety Score  Nervous, Anxious, on Edge 0 0 0 0  Control/stop worrying 0 0 0 0  Worry too much - different things 0 0 0 0  Trouble relaxing 0 0 0 0  Restless 0 0 0 0  Easily annoyed or irritable 0 0 0 0  Afraid - awful might happen 0 0 0 0  Total GAD 7 Score 0 0 0 0  Anxiety Difficulty    Not difficult at all     Assessment/ Plan: [name] here for annual physical exam.   Annual physical exam  Type 2 diabetes mellitus with other specified complication, without long-term current use of insulin (HCC) - Plan: CMP14+EGFR, Bayer DCA Hb A1c Waived  Hyperlipidemia associated with type 2 diabetes mellitus (HCC) - Plan: CMP14+EGFR, Lipid Panel, rosuvastatin (CRESTOR) 20 MG tablet, ezetimibe (ZETIA) 10 MG tablet  Hypertension associated with diabetes (HCC) - Plan: CMP14+EGFR, propranolol (INDERAL) 10 MG tablet  Tremor, essential - Plan: propranolol (INDERAL) 10 MG tablet  Tetanus shot administered today.  Sugar with small rise but still under good control with A1c at 6.3.  Continue diet modification to reduce blood sugar.  Check renal function, liver enzymes.  Diabetic eye exam was performed today and he will [facility]  Fasting lipid collected.  Continue increased dose of statin and Zetia.  Check liver enzymes given increased dose  Blood pressure under excellent control.  No changes needed.  Tremors chronic and stable.  Counseled on healthy lifestyle choices, including diet (rich in fruits, vegetables and lean meats and low in salt and simple carbohydrates) and exercise (  at least 30 minutes of moderate physical activity daily).  Patient to follow up 36m for DM  Julieana Eshleman M. Nadine Counts, DO

## 2023-04-27 ENCOUNTER — Encounter: Payer: Self-pay | Admitting: Family Medicine

## 2023-04-27 LAB — CMP14+EGFR
ALT: 20 IU/L (ref 0–44)
AST: 21 IU/L (ref 0–40)
Albumin: 4.3 g/dL (ref 3.8–4.8)
Alkaline Phosphatase: 85 IU/L (ref 44–121)
BUN/Creatinine Ratio: 14 (ref 10–24)
BUN: 13 mg/dL (ref 8–27)
Bilirubin Total: 1 mg/dL (ref 0.0–1.2)
CO2: 23 mmol/L (ref 20–29)
Calcium: 9.5 mg/dL (ref 8.6–10.2)
Chloride: 103 mmol/L (ref 96–106)
Creatinine, Ser: 0.94 mg/dL (ref 0.76–1.27)
Globulin, Total: 2.2 g/dL (ref 1.5–4.5)
Glucose: 116 mg/dL — ABNORMAL HIGH (ref 70–99)
Potassium: 4.7 mmol/L (ref 3.5–5.2)
Sodium: 140 mmol/L (ref 134–144)
Total Protein: 6.5 g/dL (ref 6.0–8.5)
eGFR: 82 mL/min/{1.73_m2} (ref >59)

## 2023-04-27 LAB — LIPID PANEL
Chol/HDL Ratio: 3.5 ratio (ref 0.0–5.0)
Cholesterol, Total: 148 mg/dL (ref 100–199)
HDL: 42 mg/dL (ref >39)
LDL Chol Calc (NIH): 85 mg/dL (ref 0–99)
Triglycerides: 116 mg/dL (ref 0–149)
VLDL Cholesterol Cal: 21 mg/dL (ref 5–40)

## 2023-05-03 DIAGNOSIS — M19011 Primary osteoarthritis, right shoulder: Secondary | ICD-10-CM | POA: Diagnosis not present

## 2023-05-07 ENCOUNTER — Ambulatory Visit: Payer: Medicare Other

## 2023-05-07 VITALS — Ht 71.0 in | Wt 224.0 lb

## 2023-05-07 DIAGNOSIS — Z0000 Encounter for general adult medical examination without abnormal findings: Secondary | ICD-10-CM | POA: Diagnosis not present

## 2023-05-07 NOTE — Progress Notes (Signed)
 Subjective:   Robert Faulkner is a 78 y.o. who presents for a Medicare Wellness preventive visit.  Visit Complete: Virtual I connected with  Robert Faulkner on 05/07/23 by a video and audio enabled telemedicine application and verified that I am speaking with the correct person using two identifiers.  Patient Location: Home  Provider Location: Home Office  I discussed the limitations of evaluation and management by telemedicine. The patient expressed understanding and agreed to proceed.  Vital Signs: Because this visit was a virtual/telehealth visit, some criteria may be missing or patient reported. Any vitals not documented were not able to be obtained and vitals that have been documented are patient reported.  VideoDeclined- This patient declined Librarian, academic. Therefore the visit was completed with audio only.  Persons Participating in Visit: Patient.  AWV Questionnaire: No: Patient Medicare AWV questionnaire was not completed prior to this visit.  Cardiac Risk Factors include: advanced age (>23men, >16 women);male gender;diabetes mellitus;hypertension;dyslipidemia     Objective:    Today's Vitals   05/07/23 1158  Weight: 224 lb (101.6 kg)  Height: 5\' 11"  (1.803 m)   Body mass index is 31.24 kg/m.     05/07/2023   12:04 PM 05/06/2022   12:06 PM 09/21/2021    2:13 PM 05/02/2021   12:12 PM 04/30/2020    9:58 AM 04/24/2019    9:37 AM 01/21/2019    7:45 PM  Advanced Directives  Does Patient Have a Medical Advance Directive? No No Yes Yes No Yes Yes  Type of Advance Directive   Living will Healthcare Power of [place];Living will  Healthcare Power of Why;Living will   Does patient want to make changes to medical advance directive?       No - Patient declined  Copy of Healthcare Power of Attorney in Chart?    No - copy requested  No - copy requested   Would patient like information on creating a medical advance directive? Yes  (MAU/Ambulatory/Procedural Areas - Information given) No - Patient declined   No - Patient declined      Current Medications (verified) Outpatient Encounter Medications as of 05/07/2023  Medication Sig   ezetimibe (ZETIA) 10 MG tablet Take 1 tablet (10 mg total) by mouth daily.   Multiple Vitamin (ONE-DAILY MULTI-VITAMIN PO) Take by mouth daily.   naproxen sodium (ALEVE) 220 MG tablet Take 220 mg by mouth. Patient states that he takes 2 by mouth every morning and 1 at bedtime   propranolol (INDERAL) 10 MG tablet Take 1 tablet (10 mg total) by mouth 3 (three) times daily.   rosuvastatin (CRESTOR) 20 MG tablet Take 1 tablet (20 mg total) by mouth daily. Dose change   No facility-administered encounter medications on file as of 05/07/2023.    Allergies (verified) Patient has no known allergies.   History: Past Medical History:  Diagnosis Date   Diabetes mellitus without complication (HCC)    Essential tremor    Hypertension    Tremors of nervous system    Past Surgical History:  Procedure Laterality Date   COLONOSCOPY N/A 09/21/2014   Procedure: COLONOSCOPY;  Surgeon: West Bali, MD;  Location: [facility];  Service: Endoscopy;  Laterality: N/A;  1200 - moved to 10:30 - office to notify   Family History  Problem Relation Age of Onset   Stroke Mother    Stroke Father    Arthritis Son    Hyperlipidemia Son    Hypertension Son    Diabetes Son  Colon cancer Neg Hx    Social History   Socioeconomic History   Marital status: Widowed    Spouse name: Not on file   Number of children: 3   Years of education: Not on file   Highest education level: 8th grade  Occupational History   Occupation: retired    Comment: [employer]  Tobacco Use   Smoking status: Former    Current packs/day: 0.00    Average packs/day: 2.0 packs/day for 11.0 years (22.0 ttl pk-yrs)    Types: Cigarettes    Start date: 09/21/1966    Quit date: 09/20/1977    Years since  quitting: 45.6   Smokeless tobacco: Never  Vaping Use   Vaping status: Never Used  Substance and Sexual Activity   Alcohol use: No   Drug use: No   Sexual activity: Not on file  Other Topics Concern   Not on file  Social History Narrative   Lives in 2 story home alone - wife passed in 2022 - his children live nearby and visit often   He's involved in church   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, cuts wood, always working)   Social Drivers of Corporate investment banker Strain: Low Risk  (05/07/2023)   Overall Financial Resource Strain (CARDIA)    Difficulty of Paying Living Expenses: Not hard at all  Food Insecurity: No Food Insecurity (05/07/2023)   Hunger Vital Sign    Worried About Running Out of Food in the Last Year: Never true    Ran Out of Food in the Last Year: Never true  Transportation Needs: No Transportation Needs (05/07/2023)   PRAPARE - Administrator, Civil Service (Medical): No    Lack of Transportation (Non-Medical): No  Physical Activity: Sufficiently Active (05/07/2023)   Exercise Vital Sign    Days of Exercise per Week: 5 days    Minutes of Exercise per Session: 30 min  Stress: No Stress Concern Present (05/07/2023)   [employer] of Occupational Health - Occupational Stress Questionnaire    Feeling of Stress : Not at all  Social Connections: Moderately Integrated (05/07/2023)   Social Connection and Isolation Panel [NHANES]    Frequency of Communication with Friends and Family: More than three times a week    Frequency of Social Gatherings with Friends and Family: More than three times a week    Attends Religious Services: More than 4 times per year    Active Member of [employer] or Organizations: Yes    Attends Banker Meetings: More than 4 times per year    Marital Status: Widowed    Tobacco Counseling Counseling given: Not Answered    Clinical Intake:  Pre-visit preparation completed: Yes  Pain : No/denies  pain     Diabetes: Yes CBG done?: No Did pt. bring in CBG monitor from home?: No  How often do you need to have someone help you when you read instructions, pamphlets, or other written materials from your doctor or pharmacy?: 1 - Never  Interpreter Needed?: No  Information entered by :: Kandis Fantasia LPN   Activities of Daily Living     05/07/2023   11:59 AM  In your present state of health, do you have any difficulty performing the following activities:  Hearing? 0  Vision? 0  Difficulty concentrating or making decisions? 0  Walking or climbing stairs? 0  Dressing or bathing? 0  Doing errands, shopping? 0  Preparing Food  and eating ? N  Using the Toilet? N  In the past six months, have you accidently leaked urine? N  Do you have problems with loss of bowel control? N  Managing your Medications? N  Managing your Finances? N  Housekeeping or managing your Housekeeping? N    Patient Care Team: Raliegh Ip, DO as PCP - General (Family Medicine) West Bali, MD (Inactive) as Consulting Physician (Gastroenterology) Clinic, Shiela Mayer, [facility] as Pharmacist (Family Medicine)  Indicate any recent Medical Services you may have received from other than [facility] providers in the past year (date may be approximate).     Assessment:   This is a routine wellness examination for [place].  Hearing/Vision screen Hearing Screening - Comments:: Denies hearing difficulties   Vision Screening - Comments:: No vision problems; will schedule routine eye exam soon     Goals Addressed             This Visit's Progress    COMPLETED: AWV       04/24/2019 AWV Goal: Fall Prevention  Over the next year, patient will decrease their risk for falls by: Using assistive devices, such as a cane or walker, as needed Identifying fall risks within their home and correcting them by: Removing throw rugs Adding handrails to stairs or ramps Removing clutter and  keeping a clear pathway throughout the home Increasing light, especially at night Adding shower handles/bars Raising toilet seat Identifying potential personal risk factors for falls: Medication side effects Incontinence/urgency Vestibular dysfunction Hearing loss Musculoskeletal disorders Neurological disorders Orthostatic hypotension       COMPLETED: Patient Stated       04/30/2020 AWV Goal: Fall Prevention  Over the next year, patient will decrease their risk for falls by: Using assistive devices, such as a cane or walker, as needed Identifying fall risks within their home and correcting them by: Removing throw rugs Adding handrails to stairs or ramps Removing clutter and keeping a clear pathway throughout the home Increasing light, especially at night Adding shower handles/bars Raising toilet seat Identifying potential personal risk factors for falls: Medication side effects Incontinence/urgency Vestibular dysfunction Hearing loss Musculoskeletal disorders Neurological disorders Orthostatic hypotension       Remain active and independent         Depression Screen     05/07/2023   12:02 PM 04/26/2023   10:55 AM 04/13/2023    3:54 PM 10/19/2022    7:52 AM 05/06/2022   12:05 PM 12/30/2021   10:13 AM 11/20/2021    1:10 PM  PHQ 2/9 Scores  PHQ - 2 Score 0 0 0 0 0 0 0  PHQ- 9 Score 0 0 0 0  0     Fall Risk     05/07/2023   12:05 PM 04/26/2023   10:55 AM 10/19/2022    7:52 AM 05/06/2022   12:04 PM 12/30/2021   10:13 AM  Fall Risk   Falls in the past year? 0 0 0 0 0  Number falls in past yr: 0 0 0 0   Injury with Fall? 0 0 0 0   Risk for fall due to : No Fall Risks No Fall Risks No Fall Risks No Fall Risks   Follow up Falls prevention discussed;Education provided;Falls evaluation completed Falls evaluation completed Education provided Falls prevention discussed     MEDICARE RISK AT HOME:  Medicare Risk at Home Any stairs in or around the home?: No If so, are  there any without  handrails?: No Home free of loose throw rugs in walkways, pet beds, electrical cords, etc?: Yes Adequate lighting in your home to reduce risk of falls?: Yes Life alert?: No Use of a cane, walker or w/c?: No Grab bars in the bathroom?: Yes Shower chair or bench in shower?: No Elevated toilet seat or a handicapped toilet?: Yes  TIMED UP AND GO:  Was the test performed?  No  Cognitive Function: 6CIT completed    11/26/2020   10:13 AM 04/21/2018    2:25 PM 09/09/2016    8:44 AM  MMSE - Mini Mental State Exam  Orientation to time 4 5 5   Orientation to Place 5 4 5   Registration 3 3 3   Attention/ Calculation 0 4 4  Recall 0 3 2  Language- name 2 objects 2 2 2   Language- repeat 1 1 1   Language- follow 3 step command 3 3 3   Language- read & follow direction 1 1 1   Write a sentence 1 1 1   Copy design 1 1 1   Total score 21 28 28         05/07/2023   12:05 PM 05/06/2022   12:07 PM 05/02/2021   12:08 PM 04/30/2020    9:59 AM 04/24/2019    9:42 AM  6CIT Screen  What Year? 0 points 0 points 0 points 0 points 0 points  What month? 0 points 0 points 0 points 0 points 0 points  What time? 0 points 0 points 0 points 0 points 0 points  Count back from 20 0 points 0 points 0 points 0 points 2 points  Months in reverse 0 points 0 points 0 points 0 points 0 points  Repeat phrase 0 points 0 points 2 points 2 points 2 points  Total Score 0 points 0 points 2 points 2 points 4 points    Immunizations Immunization History  Administered Date(s) Administered   Fluad Quad(high Dose 65+) 12/28/2018, 11/20/2019, 11/26/2020, 11/13/2021   Fluad Trivalent(High Dose 65+) 12/02/2022   Influenza, High Dose Seasonal PF 11/25/2015, 12/21/2016   Influenza-Unspecified 12/04/2017   Moderna SARS-COV2 Booster Vaccination 07/24/2020   Moderna Sars-Covid-2 Vaccination 05/01/2019, 05/29/2019, 12/25/2019, 07/24/2020, 12/07/2020   Pneumococcal Conjugate-13 09/09/2016   Pneumococcal Polysaccharide-23  04/21/2018   Tdap 05/06/2021   Zoster Recombinant(Shingrix) 10/19/2022, 04/26/2023    Screening Tests Health Maintenance  Topic Date Due   OPHTHALMOLOGY EXAM  10/14/2022   COVID-19 Vaccine (7 - 2024-25 season) 05/12/2023 (Originally 10/25/2022)   Diabetic kidney evaluation - Urine ACR  10/19/2023   FOOT EXAM  10/19/2023   HEMOGLOBIN A1C  10/27/2023   Diabetic kidney evaluation - eGFR measurement  04/25/2024   Medicare Annual Wellness (AWV)  05/06/2024   Colonoscopy  09/20/2024   DTaP/Tdap/Td (2 - Td or Tdap) 05/07/2031   Pneumonia Vaccine 31+ Years old  Completed   INFLUENZA VACCINE  Completed   Zoster Vaccines- Shingrix  Completed   HPV VACCINES  Aged Out    Health Maintenance  Health Maintenance Due  Topic Date Due   OPHTHALMOLOGY EXAM  10/14/2022   Health Maintenance Items Addressed: Plans to schedule eye exam   Additional Screening:  Vision Screening: Recommended annual ophthalmology exams for early detection of glaucoma and other disorders of the eye.  Dental Screening: Recommended annual dental exams for proper oral hygiene  Community Resource Referral / Chronic Care Management: CRR required this visit?  No   CCM required this visit?  No     Plan:     I have  personally reviewed and noted the following in the patient's chart:   Medical and social history Use of alcohol, tobacco or illicit drugs  Current medications and supplements including opioid prescriptions. Patient is not currently taking opioid prescriptions. Functional ability and status Nutritional status Physical activity Advanced directives List of other physicians Hospitalizations, surgeries, and ER visits in previous 12 months Vitals Screenings to include cognitive, depression, and falls Referrals and appointments  In addition, I have reviewed and discussed with patient certain preventive protocols, quality metrics, and best practice recommendations. A written personalized care plan for  preventive services as well as general preventive health recommendations were provided to patient.     Kandis Fantasia [place], [place]   1/61/0960   After Visit Summary: (MyChart) Due to this being a telephonic visit, the after visit summary with patients personalized plan was offered to patient via MyChart   Notes: Nothing significant to report at this time.

## 2023-05-07 NOTE — Patient Instructions (Signed)
 Robert Faulkner , Thank you for taking time to come for your Medicare Wellness Visit. I appreciate your ongoing commitment to your health goals. Please review the following plan we discussed and let me know if I can assist you in the future.   Referrals/Orders/Follow-Ups/Clinician Recommendations: Aim for 30 minutes of exercise or brisk walking, 6-8 glasses of water, and 5 servings of fruits and vegetables each day.  This is a list of the screening recommended for you and due dates:  Health Maintenance  Topic Date Due   Eye exam for diabetics  10/14/2022   COVID-19 Vaccine (7 - 2024-25 season) 05/12/2023*   Yearly kidney health urinalysis for diabetes  10/19/2023   Complete foot exam   10/19/2023   Hemoglobin A1C  10/27/2023   Yearly kidney function blood test for diabetes  04/25/2024   Medicare Annual Wellness Visit  05/06/2024   Colon Cancer Screening  09/20/2024   DTaP/Tdap/Td vaccine (2 - Td or Tdap) 05/07/2031   Pneumonia Vaccine  Completed   Flu Shot  Completed   Zoster (Shingles) Vaccine  Completed   HPV Vaccine  Aged Out  *Topic was postponed. The date shown is not the original due date.    Advanced directives: (ACP Link)Information on Advanced Care Planning can be found at [facility] Advance Health Care Directives Advance Health Care Directives. [email or web address]   Next Medicare Annual Wellness Visit scheduled for next year: Yes

## 2023-05-17 DIAGNOSIS — M6281 Muscle weakness (generalized): Secondary | ICD-10-CM | POA: Diagnosis not present

## 2023-05-17 DIAGNOSIS — M25611 Stiffness of right shoulder, not elsewhere classified: Secondary | ICD-10-CM | POA: Diagnosis not present

## 2023-05-17 DIAGNOSIS — M25511 Pain in right shoulder: Secondary | ICD-10-CM | POA: Diagnosis not present

## 2023-05-24 DIAGNOSIS — M25511 Pain in right shoulder: Secondary | ICD-10-CM | POA: Diagnosis not present

## 2023-05-24 DIAGNOSIS — M25611 Stiffness of right shoulder, not elsewhere classified: Secondary | ICD-10-CM | POA: Diagnosis not present

## 2023-05-24 DIAGNOSIS — M6281 Muscle weakness (generalized): Secondary | ICD-10-CM | POA: Diagnosis not present

## 2023-05-31 DIAGNOSIS — M6281 Muscle weakness (generalized): Secondary | ICD-10-CM | POA: Diagnosis not present

## 2023-05-31 DIAGNOSIS — M19011 Primary osteoarthritis, right shoulder: Secondary | ICD-10-CM | POA: Diagnosis not present

## 2023-05-31 DIAGNOSIS — M25611 Stiffness of right shoulder, not elsewhere classified: Secondary | ICD-10-CM | POA: Diagnosis not present

## 2023-05-31 DIAGNOSIS — M25511 Pain in right shoulder: Secondary | ICD-10-CM | POA: Diagnosis not present

## 2023-06-07 DIAGNOSIS — M25511 Pain in right shoulder: Secondary | ICD-10-CM | POA: Diagnosis not present

## 2023-06-07 DIAGNOSIS — M25611 Stiffness of right shoulder, not elsewhere classified: Secondary | ICD-10-CM | POA: Diagnosis not present

## 2023-06-07 DIAGNOSIS — M6281 Muscle weakness (generalized): Secondary | ICD-10-CM | POA: Diagnosis not present

## 2023-06-09 ENCOUNTER — Other Ambulatory Visit: Payer: Self-pay | Admitting: Family Medicine

## 2023-06-09 DIAGNOSIS — H3509 Other intraretinal microvascular abnormalities: Secondary | ICD-10-CM

## 2023-06-09 NOTE — Progress Notes (Signed)
 Pt informed, sounds like he might be confused, he states that he has not had his eyes checked recently and doesn't know where this resulted. Pt states that he will still schedule with specialist once they reach out. LS

## 2023-06-09 NOTE — Progress Notes (Signed)
 Robert Faulkner, can you call [place] or [place] and let them know this was from the DM eye check we did here.

## 2023-06-09 NOTE — Progress Notes (Signed)
 Tried calling pt at home. No answer and vmail not set up. Called son, Darrelyn Ely. No answer but left detailed message making aware of referral and why.

## 2023-06-09 NOTE — Progress Notes (Signed)
 Please inform pt and his son that his retinal eye exam showed some abnormalities so I am referring to an eye specialist in [place] for further examination/ recommendations  Orders Placed This Encounter  Procedures   Ambulatory referral to Ophthalmology    Referral Priority:   Routine    Referral Type:   Consultation    Referral Reason:   Specialty Services Required    Requested Specialty:   Ophthalmology    Number of Visits Requested:   1

## 2023-06-10 DIAGNOSIS — M19011 Primary osteoarthritis, right shoulder: Secondary | ICD-10-CM | POA: Diagnosis not present

## 2023-06-14 ENCOUNTER — Ambulatory Visit: Payer: Self-pay

## 2023-06-14 ENCOUNTER — Encounter: Payer: Self-pay | Admitting: Family Medicine

## 2023-06-14 ENCOUNTER — Ambulatory Visit: Admitting: Family Medicine

## 2023-06-14 ENCOUNTER — Ambulatory Visit (INDEPENDENT_AMBULATORY_CARE_PROVIDER_SITE_OTHER): Admitting: Family Medicine

## 2023-06-14 VITALS — Ht 71.0 in | Wt 219.0 lb

## 2023-06-14 DIAGNOSIS — M25611 Stiffness of right shoulder, not elsewhere classified: Secondary | ICD-10-CM | POA: Diagnosis not present

## 2023-06-14 DIAGNOSIS — M25511 Pain in right shoulder: Secondary | ICD-10-CM | POA: Diagnosis not present

## 2023-06-14 DIAGNOSIS — R29898 Other symptoms and signs involving the musculoskeletal system: Secondary | ICD-10-CM | POA: Diagnosis not present

## 2023-06-14 DIAGNOSIS — R42 Dizziness and giddiness: Secondary | ICD-10-CM

## 2023-06-14 NOTE — Progress Notes (Signed)
 Subjective: CC: Dizziness PCP: Eliodoro Guerin, DO [name] is a 78 y.o. male presenting to clinic today for:  1.  Dizziness Patient reports that he has been having intermittent dizziness that seems to be predominantly over the last 3 to 4 days, just in the morning when he gets up from a lying position.  It can last several hours before resolving.  He notes having had something very similar many years ago and was treated with meclizine  for that.  He feels like he is hydrating adequately.  Denies any diarrhea, no blood loss from anywhere.  No recent illnesses.  No shortness of breath or change in exercise tolerance.  No lower extremity edema.  No changes in diet, meds.  He had a cortisone injection into the right shoulder, which was recommended to be treated surgically about a month ago.   ROS: Per HPI  No Known Allergies Past Medical History:  Diagnosis Date   Diabetes mellitus without complication (HCC)    Essential tremor    Hypertension    Tremors of nervous system     Current Outpatient Medications:    ezetimibe  (ZETIA ) 10 MG tablet, Take 1 tablet (10 mg total) by mouth daily., Disp: 100 tablet, Rfl: 3   Multiple Vitamin (ONE-DAILY MULTI-VITAMIN PO), Take by mouth daily., Disp: , Rfl:    naproxen  sodium (ALEVE ) 220 MG tablet, Take 220 mg by mouth. Patient states that he takes 2 by mouth every morning and 1 at bedtime, Disp: , Rfl:    propranolol  (INDERAL ) 10 MG tablet, Take 1 tablet (10 mg total) by mouth 3 (three) times daily., Disp: 300 tablet, Rfl: 3   rosuvastatin  (CRESTOR ) 20 MG tablet, Take 1 tablet (20 mg total) by mouth daily. Dose change, Disp: 100 tablet, Rfl: 3 Social History   Socioeconomic History   Marital status: Widowed    Spouse name: Not on file   Number of children: 3   Years of education: Not on file   Highest education level: 8th grade  Occupational History   Occupation: retired    Comment: [employer]  Tobacco Use    Smoking status: Former    Current packs/day: 0.00    Average packs/day: 2.0 packs/day for 11.0 years (22.0 ttl pk-yrs)    Types: Cigarettes    Start date: 09/21/1966    Quit date: 09/20/1977    Years since quitting: 45.7   Smokeless tobacco: Never  Vaping Use   Vaping status: Never Used  Substance and Sexual Activity   Alcohol use: No   Drug use: No   Sexual activity: Not on file  Other Topics Concern   Not on file  Social History Narrative   Lives in 2 story home alone - wife passed in 2022 - his children live nearby and visit often   He's involved in church   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, cuts wood, always working)   Social Drivers of Corporate investment banker Strain: Low Risk  (05/07/2023)   Overall Financial Resource Strain (CARDIA)    Difficulty of Paying Living Expenses: Not hard at all  Food Insecurity: No Food Insecurity (05/07/2023)   Hunger Vital Sign    Worried About Running Out of Food in the Last Year: Never true    Ran Out of Food in the Last Year: Never true  Transportation Needs: No Transportation Needs (05/07/2023)   PRAPARE - Administrator, Civil Service (Medical): No  Lack of Transportation (Non-Medical): No  Physical Activity: Sufficiently Active (05/07/2023)   Exercise Vital Sign    Days of Exercise per Week: 5 days    Minutes of Exercise per Session: 30 min  Stress: No Stress Concern Present (05/07/2023)   [employer] of Occupational Health - Occupational Stress Questionnaire    Feeling of Stress : Not at all  Social Connections: Moderately Integrated (05/07/2023)   Social Connection and Isolation Panel [NHANES]    Frequency of Communication with Friends and Family: More than three times a week    Frequency of Social Gatherings with Friends and Family: More than three times a week    Attends Religious Services: More than 4 times per year    Active Member of [employer] or Organizations: Yes    Attends Tax inspector Meetings: More than 4 times per year    Marital Status: Widowed  Intimate Partner Violence: Not At Risk (05/07/2023)   Humiliation, Afraid, Rape, and Kick questionnaire    Fear of Current or Ex-Partner: No    Emotionally Abused: No    Physically Abused: No    Sexually Abused: No   Family History  Problem Relation Age of Onset   Stroke Mother    Stroke Father    Arthritis Son    Hyperlipidemia Son    Hypertension Son    Diabetes Son    Colon cancer Neg Hx     Objective: Office vital signs reviewed. BP (!) 145/68 (BP Location: Left Arm, Patient Position: Standing)   Pulse 77   Ht 5\' 11"  (1.803 m)   Wt 219 lb (99.3 kg)   SpO2 90%   BMI 30.54 kg/m   Physical Examination:  General: Awake, alert, well nourished, No acute distress HEENT: sclera white, MMM. Cardio: regular rate and rhythm, S1S2 heard, no murmurs appreciated Pulm: clear to auscultation bilaterally, no wheezes, rhonchi or rales; normal work of breathing on room air Neuro: No focal neurologic deficits except for difficulty hearing and requires glasses  Orthostatic Vitals for the past 48 hrs (Last 6 readings):  Patient Position Orthostatic BP Orthostatic Pulse BP Location  06/14/23 1251 Supine 133/75 62 Left Arm  06/14/23 1303 Sitting 133/77 65 Left Arm  06/14/23 1304 Standing 145/68 77 Left Arm    Assessment/ Plan: 78 y.o. male   Orthostatic dizziness - Plan: CBC, [id number]  He had orthostatic dizziness precipitated with position change during our exam.  He had no focal neurologic deficits on exam but I would like to see him closely back to ensure that he is symptoms are resolving.  I asked that he increase his hydration as well as use compression hose and of course change positions slowly.  I will check for any evidence of anemia, electrolyte imbalance etc. in the meantime.   Eliodoro Guerin, DO [facility] [phone]

## 2023-06-14 NOTE — Patient Instructions (Signed)
 Orthostatic Hypotension Blood pressure is a measurement of how strongly, or weakly, your circulating blood is pressing against the walls of your arteries. Orthostatic hypotension is a drop in blood pressure that can happen when you change positions, such as when you go from lying down to standing. Arteries are blood vessels that carry blood from your heart throughout your body. When blood pressure is too low, you may not get enough blood to your brain or to the rest of your organs. Orthostatic hypotension can cause light-headedness, sweating, rapid heartbeat, blurred vision, and fainting. These symptoms require further investigation into the cause. What are the causes? Orthostatic hypotension can be caused by many things, including: Sudden changes in posture, such as standing up quickly after you have been sitting or lying down. Loss of blood (anemia) or loss of body fluids (dehydration). Heart problems, neurologic problems, or hormone problems. Pregnancy. Aging. The risk for this condition increases as you get older. Severe infection (sepsis). Certain medicines, such as medicines for high blood pressure or medicines that make the body lose excess fluids (diuretics). What are the signs or symptoms? Symptoms of this condition may include: Weakness, light-headedness, or dizziness. Sweating. Blurred vision. Tiredness (fatigue). Rapid heartbeat. Fainting, in severe cases. How is this diagnosed? This condition is diagnosed based on: Your symptoms and medical history. Your blood pressure measurements. Your health care provider will check your blood pressure when you are: Lying down. Sitting. Standing. A blood pressure reading is recorded as two numbers, such as "120 over 80" (or 120/80). The first ("top") number is called the systolic pressure. It is a measure of the pressure in your arteries as your heart beats. The second ("bottom") number is called the diastolic pressure. It is a measure of  the pressure in your arteries when your heart relaxes between beats. Blood pressure is measured in a unit called mmHg. Healthy blood pressure for most adults is 120/80 mmHg. Orthostatic hypotension is defined as a 20 mmHg drop in systolic pressure or a 10 mmHg drop in diastolic pressure within 3 minutes of standing. Other information or tests that may be used to diagnose orthostatic hypotension include: Your other vital signs, such as your heart rate and temperature. Blood tests. An electrocardiogram (ECG) or echocardiogram. A Holter monitor. This is a device you wear that records your heart rhythm continuously, usually for 24-48 hours. Tilt table test. For this test, you will be safely secured to a table that moves you from a lying position to an upright position. Your heart rhythm and blood pressure will be monitored during the test. How is this treated? This condition may be treated by: Changing your diet. This may involve eating more salt (sodium) or drinking more water. Changing the dosage of certain medicines you are taking that might be lowering your blood pressure. Correcting the underlying reason for the orthostatic hypotension. Wearing compression stockings. Taking medicines to raise your blood pressure. Avoiding actions that trigger symptoms. Follow these instructions at home: Medicines Take over-the-counter and prescription medicines only as told by your health care provider. Follow instructions from your health care provider about changing the dosage of your current medicines, if this applies. Do not stop or adjust any of your medicines on your own. Eating and drinking  Drink enough fluid to keep your urine pale yellow. Eat extra salt only as directed. Do not add extra salt to your diet unless advised by your health care provider. Eat frequent, small meals. Avoid standing up suddenly after eating. General instructions  Get up slowly from lying down or sitting positions. This  gives your blood pressure a chance to adjust. Avoid hot showers and excessive heat as directed by your health care provider. Engage in regular physical activity as directed by your health care provider. If you have compression stockings, wear them as told. Keep all follow-up visits. This is important. Contact a health care provider if: You have a fever for more than 2-3 days. You feel more thirsty than usual. You feel dizzy or weak. Get help right away if: You have chest pain. You have a fast or irregular heartbeat. You become sweaty or feel light-headed. You feel short of breath. You faint. You have any symptoms of a stroke. "BE FAST" is an easy way to remember the main warning signs of a stroke: B - Balance. Signs are dizziness, sudden trouble walking, or loss of balance. E - Eyes. Signs are trouble seeing or a sudden change in vision. F - Face. Signs are sudden weakness or numbness of the face, or the face or eyelid drooping on one side. A - Arms. Signs are weakness or numbness in an arm. This happens suddenly and usually on one side of the body. S - Speech. Signs are sudden trouble speaking, slurred speech, or trouble understanding what people say. T - Time. Time to call emergency services. Write down what time symptoms started. You have other signs of a stroke, such as: A sudden, severe headache with no known cause. Nausea or vomiting. Seizure. These symptoms may represent a serious problem that is an emergency. Do not wait to see if the symptoms will go away. Get medical help right away. Call your local emergency services (911 in the [place] Do not drive yourself to the hospital. Summary Orthostatic hypotension is a sudden drop in blood pressure. It can cause light-headedness, sweating, rapid heartbeat, blurred vision, and fainting. Orthostatic hypotension can be diagnosed by having your blood pressure taken while lying down, sitting, and then standing. Treatment may involve  changing your diet, wearing compression stockings, sitting up slowly, adjusting your medicines, or correcting the underlying reason for the orthostatic hypotension. Get help right away if you have chest pain, a fast or irregular heartbeat, or symptoms of a stroke. This information is not intended to replace advice given to you by your health care provider. Make sure you discuss any questions you have with your health care provider. Document Revised: 04/25/2020 Document Reviewed: 04/25/2020 Elsevier Patient Education  2024 [employer].

## 2023-06-14 NOTE — Telephone Encounter (Signed)
  Chief Complaint: Dizzy Symptoms:  Frequency: constant Pertinent Negatives: Patient denies chest pain, new vision changes, numbness, tingling, weakness Disposition: [] ED /[] Urgent Care (no appt availability in office) / [x] Appointment(In office/virtual)/ []  [facility]/ [] Home Care/ [] Refused Recommended Disposition /[] [facility]/ []  Follow-up with PCP Additional Notes:  Son Zoila Hines calling in for ongoing dizziness for several days. He has experienced this in the past and in 2019  he was prescribed meclazine with great effect. Currently he is experiencing dizziness while walking,  walking off balance. No new vision changes. Denies all other symptoms. Requesting evaluation. Acute visit scheduled today with DOD. Educated on care advice as documented in protocol, patient verbalized understanding. Discussed reasons to call back.   Reason for Disposition  [1] MODERATE dizziness (e.g., vertigo; feels very unsteady, interferes with normal activities) AND [2] has NOT been evaluated by doctor (or NP/PA) for this  Protocols used: Dizziness - Vertigo-A-AH

## 2023-06-15 ENCOUNTER — Encounter: Payer: Self-pay | Admitting: Family Medicine

## 2023-06-15 LAB — CMP14+EGFR
ALT: 23 IU/L (ref 0–44)
AST: 23 IU/L (ref 0–40)
Albumin: 4.4 g/dL (ref 3.8–4.8)
Alkaline Phosphatase: 78 IU/L (ref 44–121)
BUN/Creatinine Ratio: 19 (ref 10–24)
BUN: 18 mg/dL (ref 8–27)
Bilirubin Total: 1.2 mg/dL (ref 0.0–1.2)
CO2: 25 mmol/L (ref 20–29)
Calcium: 9.7 mg/dL (ref 8.6–10.2)
Chloride: 101 mmol/L (ref 96–106)
Creatinine, Ser: 0.97 mg/dL (ref 0.76–1.27)
Globulin, Total: 2.1 g/dL (ref 1.5–4.5)
Glucose: 121 mg/dL — ABNORMAL HIGH (ref 70–99)
Potassium: 4.4 mmol/L (ref 3.5–5.2)
Sodium: 139 mmol/L (ref 134–144)
Total Protein: 6.5 g/dL (ref 6.0–8.5)
eGFR: 79 mL/min/{1.73_m2} (ref >59)

## 2023-06-15 LAB — CBC
Hematocrit: 43.1 % (ref 37.5–51.0)
Hemoglobin: 14.5 g/dL (ref 13.0–17.7)
MCH: 30.3 pg (ref 26.6–33.0)
MCHC: 33.6 g/dL (ref 31.5–35.7)
MCV: 90 fL (ref 79–97)
Platelets: 165 10*3/uL (ref 150–450)
RBC: 4.79 x10E6/uL (ref 4.14–5.80)
RDW: 12.7 % (ref 11.6–15.4)
WBC: 8.3 10*3/uL (ref 3.4–10.8)

## 2023-06-21 DIAGNOSIS — R29898 Other symptoms and signs involving the musculoskeletal system: Secondary | ICD-10-CM | POA: Diagnosis not present

## 2023-06-21 DIAGNOSIS — M25611 Stiffness of right shoulder, not elsewhere classified: Secondary | ICD-10-CM | POA: Diagnosis not present

## 2023-06-21 DIAGNOSIS — M25511 Pain in right shoulder: Secondary | ICD-10-CM | POA: Diagnosis not present

## 2023-06-28 DIAGNOSIS — M25511 Pain in right shoulder: Secondary | ICD-10-CM | POA: Diagnosis not present

## 2023-06-28 DIAGNOSIS — M25611 Stiffness of right shoulder, not elsewhere classified: Secondary | ICD-10-CM | POA: Diagnosis not present

## 2023-06-28 DIAGNOSIS — R29898 Other symptoms and signs involving the musculoskeletal system: Secondary | ICD-10-CM | POA: Diagnosis not present

## 2023-07-01 ENCOUNTER — Telehealth: Payer: Self-pay | Admitting: Family Medicine

## 2023-07-01 NOTE — Telephone Encounter (Unsigned)
 Copied from CRM [id number]. Topic: Referral - Status >> Jun 30, 2023  4:05 PM Tiffany S wrote: Reason for CRM: Gave referral information

## 2023-07-05 ENCOUNTER — Telehealth: Payer: Self-pay

## 2023-07-05 DIAGNOSIS — M6281 Muscle weakness (generalized): Secondary | ICD-10-CM | POA: Diagnosis not present

## 2023-07-05 DIAGNOSIS — M25511 Pain in right shoulder: Secondary | ICD-10-CM | POA: Diagnosis not present

## 2023-07-05 DIAGNOSIS — M25611 Stiffness of right shoulder, not elsewhere classified: Secondary | ICD-10-CM | POA: Diagnosis not present

## 2023-07-05 NOTE — Telephone Encounter (Signed)
 Attempted to call pt to r/s apt for 05/13 due to provider being out this week , no vm set up   Please get pt r/s if he calls back

## 2023-07-06 ENCOUNTER — Ambulatory Visit: Admitting: Family Medicine

## 2023-07-15 NOTE — Telephone Encounter (Signed)
 Checking on referral done in April. Hadn't heard anything.

## 2023-07-15 NOTE — Telephone Encounter (Unsigned)
 Copied from CRM [id number]. Topic: Referral - Status >> Jun 30, 2023  4:05 PM Tiffany S wrote: Reason for CRM: Gave referral information >> Jul 15, 2023  2:57 PM Crispin Dolphin wrote: Patient son calling to follow up. Provider where referral need to be sent has not yet received it. He called 5/7 to make office aware that it needed to be faxed. They do not accept electronic submissions.  Can fax to [phone] >> Jul 01, 2023  3:13 PM Smiley Dung G wrote: Faythe Hopes called his number is [phone].Arnetta Lank the place father was referred to said the don't accept electronic.Aaron Aas they need a signed faxed referral??  He wasn't given a fax number.

## 2023-07-20 NOTE — Telephone Encounter (Signed)
 Referral was faxed on 5/1.  I have refaxed Referral to Office at this time.

## 2023-07-20 NOTE — Telephone Encounter (Signed)
 Made son aware.

## 2023-08-02 ENCOUNTER — Other Ambulatory Visit: Payer: Self-pay | Admitting: Family Medicine

## 2023-08-02 DIAGNOSIS — E1169 Type 2 diabetes mellitus with other specified complication: Secondary | ICD-10-CM

## 2023-08-03 NOTE — Progress Notes (Addendum)
 [facility] - Clinic Note  08/16/2023   CHIEF COMPLAINT Patient presents for Retina Evaluation  HISTORY OF PRESENT ILLNESS: Robert Faulkner is a 78 y.o. male who presents to the clinic today for:  HPI     Retina Evaluation   In both eyes.  I, the attending physician,  performed the HPI with the patient and updated documentation appropriately.        Comments   Retina eval per DR Rosina Holster for tortuous retinal vein and diabetic eye exam pt is reporting that distance vision is not as sharp things look like smoke he denies any flashes or floaters his last reading 117 last week and last A1C is unknown       Last edited by [name], MD on 08/16/2023 11:08 PM.    Pt is here on the referral of Dr. Norene Fielding for tortuous vessels in his eyes, pt states he has high blood pressure, but no history of heart attack or stroke, he denies hx of blood clots, pt is diabetic and Dr Fielding takes pictures of the back of his eyes in her clinic   Referring physician: Fielding Norene HERO, DO [address] [place],  [place] [address]  HISTORICAL INFORMATION:  Selected notes from the MEDICAL RECORD NUMBER Referred by Dr. Fielding for dilated and tortuous venules OU - found during in-office diabetic eye screening LEE:  Ocular Hx- PMH-   CURRENT MEDICATIONS: No current outpatient medications on file. (Ophthalmic Drugs)   No current facility-administered medications for this visit. (Ophthalmic Drugs)   Current Outpatient Medications (Other)  Medication Sig   ezetimibe  (ZETIA ) 10 MG tablet Take 1 tablet (10 mg total) by mouth daily.   Multiple Vitamin (ONE-DAILY MULTI-VITAMIN PO) Take by mouth daily.   naproxen  sodium (ALEVE ) 220 MG tablet Take 220 mg by mouth. Patient states that he takes 2 by mouth every morning and 1 at bedtime   propranolol  (INDERAL ) 10 MG tablet Take 1 tablet (10 mg total) by mouth 3 (three) times daily.   rosuvastatin  (CRESTOR ) 20 MG  tablet Take 1 tablet (20 mg total) by mouth daily. Dose change   No current facility-administered medications for this visit. (Other)   REVIEW OF SYSTEMS: ROS   Positive for: Endocrine, Cardiovascular, Eyes Last edited by Resa Delon ORN, COT on 08/16/2023  9:09 AM.     ALLERGIES No Known Allergies PAST MEDICAL HISTORY Past Medical History:  Diagnosis Date   Diabetes mellitus without complication (HCC)    Essential tremor    Hypertension    Tremors of nervous system    Past Surgical History:  Procedure Laterality Date   COLONOSCOPY N/A 09/21/2014   Procedure: COLONOSCOPY;  Surgeon: Margo LITTIE Haddock, MD;  Location: [facility];  Service: Endoscopy;  Laterality: N/A;  1200 - moved to 10:30 - office to notify   FAMILY HISTORY Family History  Problem Relation Age of Onset   Stroke Mother    Stroke Father    Arthritis Son    Hyperlipidemia Son    Hypertension Son    Diabetes Son    Colon cancer Neg Hx    SOCIAL HISTORY Social History   Tobacco Use   Smoking status: Former    Current packs/day: 0.00    Average packs/day: 2.0 packs/day for 11.0 years (22.0 ttl pk-yrs)    Types: Cigarettes    Start date: 09/21/1966    Quit date: 09/20/1977    Years since quitting: 45.9   Smokeless  tobacco: Never  Vaping Use   Vaping status: Never Used  Substance Use Topics   Alcohol use: No   Drug use: No       OPHTHALMIC EXAM:  Base Eye Exam     Visual Acuity (Snellen - Linear)       Right Left   Dist cc 20/40 -2 20/70 -2   Dist ph cc NI 20/60 -2         Tonometry (Tonopen, 9:20 AM)       Right Left   Pressure 16 16         Pupils       Pupils Dark Light Shape React APD   Right PERRL 3 2 Round Brisk None   Left PERRL 3 2 Round Brisk None         Visual Fields       Left Right    Full Full         Extraocular Movement       Right Left    Full, Ortho Full, Ortho         Neuro/Psych     Oriented x3: Yes   Mood/Affect: Normal          Dilation     Both eyes: 2.5% Phenylephrine @ 9:20 AM           Slit Lamp and Fundus Exam     Slit Lamp Exam       Right Left   Lids/Lashes Dermatochalasis - upper lid Dermatochalasis - upper lid, mild MGD   Conjunctiva/Sclera nasal pingeucula nasal pingeucula, temporal pterygium   Cornea tear film debris 2+ Punctate epithelial erosions, temporal pterygium with calcifications   Anterior Chamber moderate depth, narrow angles moderate depth, narrow angles   Iris Round and dilated, No NVI Round and dilated, No NVI, +PPM   Lens 2-3+ Nuclear sclerosis, 2-3+ Cortical cataract, Pseudoexfoliative material with bullseye pattern 2-3+ Nuclear sclerosis, 2-3+ Cortical cataract   Anterior Vitreous mild syneresis mild syneresis         Fundus Exam       Right Left   Disc Pink and Sharp, +PPP Pink and Sharp   C/D Ratio 0.2 0.2   Macula Flat, Blunted foveal reflex, RPE mottling and clumping, Drusen Flat, Blunted foveal reflex, Drusen, RPE mottling and clumping   Vessels Attenuated arterioles, 3-4+ tortuosity venules Attenuated arterioles, 3-4+ tortuosity venules   Periphery Attached, reticular degeneration, No heme Attached, reticular degeneration, No heme           Refraction     Wearing Rx       Sphere Cylinder Axis Add   Right +4.50 +0.50 015 +2.75   Left +4.75 +0.75 105 +2.75         Manifest Refraction   Unable to correct any better           IMAGING AND PROCEDURES  Imaging and Procedures for 08/16/2023  OCT, Retina - OU - Both Eyes       Right Eye Quality was good. Central Foveal Thickness: 270. Progression has no prior data. Findings include normal foveal contour, no IRF, no SRF, retinal drusen (Dilated and tortuous venules seen on en face).   Left Eye Quality was good. Central Foveal Thickness: 255. Progression has no prior data. Findings include normal foveal contour, no IRF, no SRF, retinal drusen (Dilated and tortuous venules seen on en face, partial  PVD).   Notes *Images captured and stored on drive  Diagnosis / Impression:  Dilated and tortuous venules seen on en face OU No IRF/SRF OU  Clinical management:  See below  Abbreviations: NFP - Normal foveal profile. CME - cystoid macular edema. PED - pigment epithelial detachment. IRF - intraretinal fluid. SRF - subretinal fluid. EZ - ellipsoid zone. ERM - epiretinal membrane. ORA - outer retinal atrophy. ORT - outer retinal tubulation. SRHM - subretinal hyper-reflective material. IRHM - intraretinal hyper-reflective material      Fluorescein Angiography Optos (Transit OS)       Right Eye Progression has no prior data. Early phase findings include normal observations. Mid/Late phase findings include staining (Scattered patchy staining, prominent tortuous venules -- no leakage).   Left Eye Progression has no prior data. Early phase findings include normal observations, delayed filling. Mid/Late phase findings include staining (Scattered patchy staining, prominent tortuous venules -- no leakage).   Notes **Images stored on drive**  Impression: Scattered patchy staining, prominent tortuous venules -- no leakage OU           ASSESSMENT/PLAN:   ICD-10-CM   1. Hypertensive retinopathy of both eyes  H35.033 OCT, Retina - OU - Both Eyes    Fluorescein Angiography Optos (Transit OS)    2. Essential hypertension  I10     3. Diabetes mellitus type 2 without retinopathy (HCC)  E11.9     4. Combined forms of age-related cataract of both eyes  H25.813     5. 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 Dilated, tortuous venules from Hypertensive Retinopathy OU  - no associated hemorrhage or exudation  - OCT without macular edema  - FA 06.23.25 without leakage  - discussed findings, prognosis - discussed importance of tight BP control - no retinal or ophthalmic interventions indicated or recommended  - monitor  3. Diabetes mellitus, type 2 without  retinopathy  - A1c 6.3 on 03.03.25 - The incidence, risk factors for progression, natural history and treatment options for diabetic retinopathy  were discussed with patient.   - The need for close monitoring of blood glucose, blood pressure, and serum lipids, avoiding cigarette or any type of tobacco, and the need for long term follow up was also discussed with patient. - f/u in 1 year, sooner prn  4,5. Mixed Cataract OU  - pseudoexfoliation of lens capsule OD -- bullseye pattern - The symptoms of cataract, surgical options, and treatments and risks were discussed with patient. - discussed diagnosis and progression - will refer to [facility] [place] for cataract eval  Ophthalmic Meds Ordered this visit:  No orders of the defined types were placed in this encounter.    Return for f/u 3-4 months, tortuous vessels OU, DFE, OCT.  There are no Patient Instructions on file for this visit.  Explained the diagnoses, plan, and follow up with the patient and they expressed understanding.  Patient expressed understanding of the importance of proper follow up care.   This document serves as a record of services personally performed by Redell JUDITHANN Hans, MD, PhD. It was created on their behalf by Avelina Pereyra, COA an ophthalmic technician. The creation of this record is the provider's dictation and/or activities during the visit.   Electronically signed by: Avelina GORMAN Pereyra, COT  08/16/23  11:22 PM   This document serves as a record of services personally performed by Redell JUDITHANN Hans, MD, PhD. It was created on their behalf by Alan PARAS. Delores, OA an ophthalmic technician. The creation of this record is  the provider's dictation and/or activities during the visit.    Electronically signed by: Alan PARAS. Delores, OA 08/16/23 11:22 PM  Redell JUDITHANN Hans, M.D., Ph.D. Diseases & Surgery of the Retina and Vitreous [facility] 08/16/2023  I have reviewed the above  documentation for accuracy and completeness, and I agree with the above. Redell JUDITHANN Hans, M.D., Ph.D. 08/16/23 11:22 PM   Abbreviations: M myopia (nearsighted); A astigmatism; H hyperopia (farsighted); P presbyopia; Mrx spectacle prescription;  CTL contact lenses; OD right eye; OS left eye; OU both eyes  XT exotropia; ET esotropia; PEK punctate epithelial keratitis; PEE punctate epithelial erosions; DES dry eye syndrome; MGD meibomian gland dysfunction; ATs artificial tears; PFAT's preservative free artificial tears; NSC nuclear sclerotic cataract; PSC posterior subcapsular cataract; ERM epi-retinal membrane; PVD posterior vitreous detachment; RD retinal detachment; DM diabetes mellitus; DR diabetic retinopathy; NPDR non-proliferative diabetic retinopathy; PDR proliferative diabetic retinopathy; CSME clinically significant macular edema; DME diabetic macular edema; dbh dot blot hemorrhages; CWS cotton wool spot; POAG primary open angle glaucoma; C/D cup-to-disc ratio; HVF humphrey visual field; GVF goldmann visual field; OCT optical coherence tomography; IOP intraocular pressure; BRVO Branch retinal vein occlusion; CRVO central retinal vein occlusion; CRAO central retinal artery occlusion; BRAO branch retinal artery occlusion; RT retinal tear; SB scleral buckle; PPV pars plana vitrectomy; VH Vitreous hemorrhage; PRP panretinal laser photocoagulation; IVK intravitreal kenalog; VMT vitreomacular traction; MH Macular hole;  NVD neovascularization of the disc; NVE neovascularization elsewhere; AREDS age related eye disease study; ARMD age related macular degeneration; POAG primary open angle glaucoma; EBMD epithelial/anterior basement membrane dystrophy; ACIOL anterior chamber intraocular lens; IOL intraocular lens; PCIOL posterior chamber intraocular lens; Phaco/IOL phacoemulsification with intraocular lens placement; PRK photorefractive keratectomy; LASIK laser assisted in situ keratomileusis; HTN hypertension; DM  diabetes mellitus; COPD chronic obstructive pulmonary disease

## 2023-08-16 ENCOUNTER — Encounter (INDEPENDENT_AMBULATORY_CARE_PROVIDER_SITE_OTHER): Payer: Self-pay | Admitting: Ophthalmology

## 2023-08-16 ENCOUNTER — Ambulatory Visit (INDEPENDENT_AMBULATORY_CARE_PROVIDER_SITE_OTHER): Admitting: Ophthalmology

## 2023-08-16 VITALS — BP 169/88 | HR 51

## 2023-08-16 DIAGNOSIS — H268 Other specified cataract: Secondary | ICD-10-CM

## 2023-08-16 DIAGNOSIS — H35033 Hypertensive retinopathy, bilateral: Secondary | ICD-10-CM | POA: Diagnosis not present

## 2023-08-16 DIAGNOSIS — H3581 Retinal edema: Secondary | ICD-10-CM

## 2023-08-16 DIAGNOSIS — I10 Essential (primary) hypertension: Secondary | ICD-10-CM

## 2023-08-16 DIAGNOSIS — E119 Type 2 diabetes mellitus without complications: Secondary | ICD-10-CM

## 2023-08-16 DIAGNOSIS — H25813 Combined forms of age-related cataract, bilateral: Secondary | ICD-10-CM

## 2023-08-17 ENCOUNTER — Ambulatory Visit (INDEPENDENT_AMBULATORY_CARE_PROVIDER_SITE_OTHER): Admitting: Family Medicine

## 2023-08-17 VITALS — BP 139/59 | HR 63 | Temp 97.9°F | Ht 71.0 in | Wt 224.0 lb

## 2023-08-17 DIAGNOSIS — H8110 Benign paroxysmal vertigo, unspecified ear: Secondary | ICD-10-CM | POA: Diagnosis not present

## 2023-08-17 MED ORDER — MECLIZINE HCL 25 MG PO TABS: 25.0000 mg | ORAL_TABLET | Freq: Three times a day (TID) | ORAL | 5 refills | Status: AC | PRN

## 2023-08-17 NOTE — Progress Notes (Signed)
 Subjective:  Patient ID: Robert Faulkner, male    DOB: [birth year]  Age: 78 y.o. MRN: [id number]  CC: Dizziness (Dizzy mostly in the mornings. Room starts to spin and after he sits he is fine. Doesn't feel bad otherwise. Seen dr.G multiple times for this.  Used to take meclizine  which helped but but he has been out for a while. Hx of vertigo and hypertension. )   HPI Robert Faulkner presents for room spinning a few seconds to 2 minutes onset 10-12 days ago. Occurs when his feet hit the floor. Fades away. May feel it when first getting out of a chair. No nausea.  Responds to meclizine  in the past.     06/14/2023   12:52 PM 05/07/2023   12:02 PM 04/26/2023   10:55 AM  Depression screen PHQ 2/9  Decreased Interest 0 0 0  Down, Depressed, Hopeless 0 0 0  PHQ - 2 Score 0 0 0  Altered sleeping 0 0 0  Tired, decreased energy 0 0 0  Change in appetite 0 0 0  Feeling bad or failure about yourself  0 0 0  Trouble concentrating 0 0 0  Moving slowly or fidgety/restless 0 0 0  Suicidal thoughts 0 0 0  PHQ-9 Score 0 0 0  Difficult doing work/chores Not difficult at all Not difficult at all Not difficult at all    History Robert Faulkner has a past medical history of Diabetes mellitus without complication (HCC), Essential tremor, Hypertension, and Tremors of nervous system.   He has a past surgical history that includes Colonoscopy (N/A, 09/21/2014).   His family history includes Arthritis in his son; Diabetes in his son; Hyperlipidemia in his son; Hypertension in his son; Stroke in his father and mother.He reports that he quit smoking about 45 years ago. His smoking use included cigarettes. He started smoking about 56 years ago. He has a 22 pack-year smoking history. He has never used smokeless tobacco. He reports that he does not drink alcohol and does not use drugs.    ROS Review of Systems  Constitutional:  Negative for fever.  Respiratory:  Negative for shortness of breath.   Cardiovascular:   Negative for chest pain.  Musculoskeletal:  Negative for arthralgias.  Skin:  Negative for rash.  Neurological:  Positive for dizziness.    Objective:  BP (!) 139/59   Pulse 63   Temp 97.9 F (36.6 C)   Ht 5' 11 (1.803 m)   Wt 224 lb (101.6 kg)   SpO2 92%   BMI 31.24 kg/m   BP Readings from Last 3 Encounters:  08/17/23 (!) 139/59  08/16/23 (!) 169/88  04/26/23 129/74    Wt Readings from Last 3 Encounters:  08/17/23 224 lb (101.6 kg)  06/14/23 219 lb (99.3 kg)  05/07/23 224 lb (101.6 kg)     Physical Exam Vitals reviewed.  Constitutional:      Appearance: He is well-developed.  HENT:     Head: Normocephalic and atraumatic.     Right Ear: Tympanic membrane and external ear normal.     Left Ear: Tympanic membrane and external ear normal.     Mouth/Throat:     Pharynx: No oropharyngeal exudate or posterior oropharyngeal erythema.   Eyes:     Pupils: Pupils are equal, round, and reactive to light.    Cardiovascular:     Rate and Rhythm: Normal rate and regular rhythm.     Heart sounds: No murmur heard. Pulmonary:     Effort:  No respiratory distress.     Breath sounds: Normal breath sounds.   Musculoskeletal:     Cervical back: Normal range of motion and neck supple.   Neurological:     Mental Status: He is alert and oriented to person, place, and time.      Assessment & Plan:  Benign paroxysmal positional vertigo, unspecified laterality  Other orders -     Meclizine  HCl; Take 1 tablet (25 mg total) by mouth 3 (three) times daily as needed for dizziness.  Dispense: 30 tablet; Refill: 5     Follow-up: No follow-ups on file.  Butler Der, M.D.

## 2023-09-13 ENCOUNTER — Ambulatory Visit: Payer: Medicare Other | Admitting: Nurse Practitioner

## 2023-09-13 DIAGNOSIS — H11052 Peripheral pterygium, progressive, left eye: Secondary | ICD-10-CM | POA: Diagnosis not present

## 2023-09-13 DIAGNOSIS — H2513 Age-related nuclear cataract, bilateral: Secondary | ICD-10-CM | POA: Diagnosis not present

## 2023-09-13 DIAGNOSIS — H01002 Unspecified blepharitis right lower eyelid: Secondary | ICD-10-CM | POA: Diagnosis not present

## 2023-09-13 DIAGNOSIS — H01001 Unspecified blepharitis right upper eyelid: Secondary | ICD-10-CM | POA: Diagnosis not present

## 2023-09-21 ENCOUNTER — Ambulatory Visit: Admitting: Nurse Practitioner

## 2023-09-21 ENCOUNTER — Encounter: Payer: Self-pay | Admitting: Nurse Practitioner

## 2023-09-21 VITALS — BP 130/74 | HR 85 | Ht 71.0 in | Wt 223.6 lb

## 2023-09-21 DIAGNOSIS — E782 Mixed hyperlipidemia: Secondary | ICD-10-CM

## 2023-09-21 DIAGNOSIS — E119 Type 2 diabetes mellitus without complications: Secondary | ICD-10-CM | POA: Diagnosis not present

## 2023-09-21 LAB — POCT UA - MICROALBUMIN
Creatinine, POC: 300 mg/dL
Microalbumin Ur, POC: 80 mg/L

## 2023-09-21 LAB — POCT GLYCOSYLATED HEMOGLOBIN (HGB A1C): Hemoglobin A1C: 6.4 % — AB (ref 4.0–5.6)

## 2023-09-21 NOTE — Patient Instructions (Signed)

## 2023-09-21 NOTE — Progress Notes (Signed)
 Endocrinology Follow Up Note       09/21/2023, 2:14 PM   Subjective:    Patient ID: Robert Faulkner, male    DOB: 1945/11/21.  Robert Faulkner is being seen in follow up after being seen in consultation for management of currently uncontrolled symptomatic diabetes requested by  [name], DO.   Past Medical History:  Diagnosis Date   Diabetes mellitus without complication (HCC)    Essential tremor    Hypertension    Tremors of nervous system     Past Surgical History:  Procedure Laterality Date   COLONOSCOPY N/A 09/21/2014   Procedure: COLONOSCOPY;  Surgeon: Margo LITTIE Haddock, MD;  Location: [facility];  Service: Endoscopy;  Laterality: N/A;  1200 - moved to 10:30 - office to notify    Social History   Socioeconomic History   Marital status: Widowed    Spouse name: Not on file   Number of children: 3   Years of education: Not on file   Highest education level: 8th grade  Occupational History   Occupation: retired    Comment: [place]  [employer]  Tobacco Use   Smoking status: Former    Current packs/day: 0.00    Average packs/day: 2.0 packs/day for 11.0 years (22.0 ttl pk-yrs)    Types: Cigarettes    Start date: 09/21/1966    Quit date: 09/20/1977    Years since quitting: 46.0   Smokeless tobacco: Never  Vaping Use   Vaping status: Never Used  Substance and Sexual Activity   Alcohol use: No   Drug use: No   Sexual activity: Not on file  Other Topics Concern   Not on file  Social History Narrative   Lives in 2 story home alone - wife passed in 2022 - his children live nearby and visit often   He's involved in church   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, cuts wood, always working)   Social Drivers of Corporate investment banker Strain: Low Risk  (08/17/2023)   Overall Financial Resource Strain (CARDIA)    Difficulty of Paying Living Expenses: Not hard at  all  Food Insecurity: No Food Insecurity (08/17/2023)   Hunger Vital Sign    Worried About Running Out of Food in the Last Year: Never true    Ran Out of Food in the Last Year: Never true  Transportation Needs: No Transportation Needs (08/17/2023)   PRAPARE - Administrator, Civil Service (Medical): No    Lack of Transportation (Non-Medical): No  Physical Activity: Insufficiently Active (08/17/2023)   Exercise Vital Sign    Days of Exercise per Week: 4 days    Minutes of Exercise per Session: 30 min  Stress: No Stress Concern Present (08/17/2023)   [employer] of Occupational Health - Occupational Stress Questionnaire    Feeling of Stress: Not at all  Social Connections: Moderately Integrated (08/17/2023)   Social Connection and Isolation Panel    Frequency of Communication with Friends and Family: More than three times a week    Frequency of Social Gatherings with Friends and Family: More than three times a week    Attends Religious Services:  More than 4 times per year    Active Member of Clubs or Organizations: Yes    Attends Banker Meetings: More than 4 times per year    Marital Status: Widowed    Family History  Problem Relation Age of Onset   Stroke Mother    Stroke Father    Arthritis Son    Hyperlipidemia Son    Hypertension Son    Diabetes Son    Colon cancer Neg Hx     Outpatient Encounter Medications as of 09/21/2023  Medication Sig   meclizine  (ANTIVERT ) 25 MG tablet Take 1 tablet (25 mg total) by mouth 3 (three) times daily as needed for dizziness.   Multiple Vitamin (ONE-DAILY MULTI-VITAMIN PO) Take by mouth daily.   naproxen  sodium (ALEVE ) 220 MG tablet Take 220 mg by mouth. Patient states that he takes 2 by mouth every morning and 1 at bedtime   propranolol  (INDERAL ) 10 MG tablet Take 1 tablet (10 mg total) by mouth 3 (three) times daily.   rosuvastatin  (CRESTOR ) 20 MG tablet Take 1 tablet (20 mg total) by mouth daily. Dose change    [DISCONTINUED] ezetimibe  (ZETIA ) 10 MG tablet Take 1 tablet (10 mg total) by mouth daily. (Patient not taking: Reported on 09/21/2023)   No facility-administered encounter medications on file as of 09/21/2023.    ALLERGIES: No Known Allergies  VACCINATION STATUS: Immunization History  Administered Date(s) Administered   Fluad Quad(high Dose 65+) 12/28/2018, 11/20/2019, 11/26/2020, 11/13/2021   Fluad Trivalent(High Dose 65+) 12/02/2022   Influenza, High Dose Seasonal PF 11/25/2015, 12/21/2016   Influenza-Unspecified 12/04/2017   Moderna SARS-COV2 Booster Vaccination 07/24/2020   Moderna Sars-Covid-2 Vaccination 05/01/2019, 05/29/2019, 12/25/2019, 07/24/2020, 12/07/2020   Pneumococcal Conjugate-13 09/09/2016   Pneumococcal Polysaccharide-23 04/21/2018   Tdap 05/06/2021   Zoster Recombinant(Shingrix ) 10/19/2022, 04/26/2023    Diabetes He presents for his follow-up diabetic visit. He has type 2 diabetes mellitus. Onset time: Diagnosed in July at approx age of 61. His disease course has been stable. There are no hypoglycemic associated symptoms. There are no diabetic associated symptoms. There are no hypoglycemic complications. There are no diabetic complications. Risk factors for coronary artery disease include diabetes mellitus, dyslipidemia, family history, obesity, male sex and hypertension. Current diabetic treatment includes diet. He is compliant with treatment most of the time. His weight is fluctuating minimally. He is following a generally healthy diet. Meal planning includes avoidance of concentrated sweets. He has not had a previous visit with a dietitian. He participates in exercise daily. His home blood glucose trend is fluctuating minimally. His overall blood glucose range is 110-130 mg/dl. (He presents today, accompanied by his son, with his logs showing at target glycemic profile overall.  His POCT A1c today is 6.4%, increasing from last A1c of 6.3%.  Analysis of his meter shows  7-day average of 104, 14-day average of 111, 30-day average of 136, 90-day average of 130.  He has done well with managing his glucose with diet and lifestyle changes only, although he does admit to cheating on his diet from time to time.) An ACE inhibitor/angiotensin II receptor blocker is contraindicated. He does not see a podiatrist.Eye exam is current.     Review of systems  Constitutional: + Minimally fluctuating body weight, current Body mass index is 31.19 kg/m., no fatigue, no subjective hyperthermia, no subjective hypothermia Eyes: no blurry vision, no xerophthalmia, + vision changes- needing bilateral cataract sx and has a growth on one eye as well ENT: no sore throat,  no nodules palpated in throat, no dysphagia/odynophagia, no hoarseness Cardiovascular: no chest pain, no shortness of breath, no palpitations, no leg swelling Respiratory: no cough, no shortness of breath Gastrointestinal: no nausea/vomiting/diarrhea Musculoskeletal: no muscle/joint aches Skin: no rashes, no hyperemia Neurological: + essential tremor-chronic per patient, no numbness, no tingling, no dizziness Psychiatric: no depression, no anxiety  Objective:     BP 130/74 (BP Location: Left Arm, Patient Position: Sitting, Cuff Size: Large)   Pulse 85   Ht 5' 11 (1.803 m)   Wt 223 lb 9.6 oz (101.4 kg)   BMI 31.19 kg/m   Wt Readings from Last 3 Encounters:  09/21/23 223 lb 9.6 oz (101.4 kg)  08/17/23 224 lb (101.6 kg)  06/14/23 219 lb (99.3 kg)     BP Readings from Last 3 Encounters:  09/21/23 130/74  08/17/23 (!) 139/59  08/16/23 (!) 169/88     Physical Exam- Limited  Constitutional:  Body mass index is 31.19 kg/m. , not in acute distress, normal state of mind Eyes:  EOMI, no exophthalmos Musculoskeletal: no gross deformities, strength intact in all four extremities, no gross restriction of joint movements Skin:  no rashes, no hyperemia Neurological: + chronic essential tremor    CMP ( most  recent) CMP     Component Value Date/Time   NA 139 06/14/2023 1402   K 4.4 06/14/2023 1402   CL 101 06/14/2023 1402   CO2 25 06/14/2023 1402   GLUCOSE 121 (H) 06/14/2023 1402   GLUCOSE 165 (H) 09/21/2021 1449   BUN 18 06/14/2023 1402   CREATININE 0.97 06/14/2023 1402   CALCIUM  9.7 06/14/2023 1402   PROT 6.5 06/14/2023 1402   ALBUMIN 4.4 06/14/2023 1402   AST 23 06/14/2023 1402   ALT 23 06/14/2023 1402   ALKPHOS 78 06/14/2023 1402   BILITOT 1.2 06/14/2023 1402   GFRNONAA >60 09/21/2021 1449   GFRAA 97 08/04/2019 0845     Diabetic Labs (most recent): Lab Results  Component Value Date   HGBA1C 6.4 (A) 09/21/2023   HGBA1C 6.3 (H) 04/26/2023   HGBA1C 5.7 (A) 09/14/2022   MICROALBUR 80 mg/L 09/21/2023     Lipid Panel ( most recent) Lipid Panel     Component Value Date/Time   CHOL 148 04/26/2023 1100   TRIG 116 04/26/2023 1100   HDL 42 04/26/2023 1100   CHOLHDL 3.5 04/26/2023 1100   LDLCALC 85 04/26/2023 1100   LABVLDL 21 04/26/2023 1100      Lab Results  Component Value Date   TSH 1.940 11/26/2020   TSH 2.660 08/04/2019   TSH 0.277 (L) 01/25/2019           Assessment & Plan:   1) Type 2 diabetes mellitus without complication, without long-term current use of insulin  (HCC)  He presents today, accompanied by his son, with his logs showing at target glycemic profile overall.  His POCT A1c today is 6.4%, increasing from last A1c of 6.3%.  Analysis of his meter shows 7-day average of 104, 14-day average of 111, 30-day average of 136, 90-day average of 130.  He has done well with managing his glucose with diet and lifestyle changes only, although he does admit to cheating on his diet from time to time.  - Robert Faulkner has currently uncontrolled symptomatic type 2 DM since 78 years of age.  -Recent labs reviewed.  POCT UM shows mild microalbuminuria.  - I had a long discussion with him about the progressive nature of diabetes and the pathology behind  its  complications. -his diabetes is not currently complicated but he remains at a high risk for more acute and chronic complications which include CAD, CVA, CKD, retinopathy, and neuropathy. These are all discussed in detail with him.  The following Lifestyle Medicine recommendations according to American College of Lifestyle Medicine [facility]) were discussed and offered to patient and he agrees to start the journey:  A. Whole Foods, Plant-based plate comprising of fruits and vegetables, plant-based proteins, whole-grain carbohydrates was discussed in detail with the patient.   A list for source of those nutrients were also provided to the patient.  Patient will use only water  or unsweetened tea for hydration. B.  The need to stay away from risky substances including alcohol, smoking; obtaining 7 to 9 hours of restorative sleep, at least 150 minutes of moderate intensity exercise weekly, the importance of healthy social connections,  and stress reduction techniques were discussed. C.  A full color page of  Calorie density of various food groups per pound showing examples of each food groups was provided to the patient.  - Nutritional counseling repeated at each appointment due to patients tendency to fall back in to old habits.  - The patient admits there is a room for improvement in their diet and drink choices. -  Suggestion is made for the patient to avoid simple carbohydrates from their diet including Cakes, Sweet Desserts / Pastries, Ice Cream, Soda (diet and regular), Sweet Tea, Candies, Chips, Cookies, Sweet Pastries, Store Bought Juices, Alcohol in Excess of 1-2 drinks a day, Artificial Sweeteners, Coffee Creamer, and Sugar-free Products. This will help patient to have stable blood glucose profile and potentially avoid unintended weight gain.   - I encouraged the patient to switch to unprocessed or minimally processed complex starch and increased protein intake (animal or plant source), fruits, and  vegetables.   - Patient is advised to stick to a routine mealtimes to eat 3 meals a day and avoid unnecessary snacks (to snack only to correct hypoglycemia).  - I have approached him with the following individualized plan to manage his diabetes and patient agrees:    -He can stay off medications altogether for now given his great glucose control with diet/lifestyle changes alone.  Will see him back in 1 year or sooner, if needed.    -he is encouraged to monitor glucose 1-2 times per week and notify clinic if glucose is above 200 consistently.   - Specific targets for  A1c; LDL, HDL, and Triglycerides were discussed with the patient.  2) Blood Pressure /Hypertension:  his blood pressure is controlled to target for his age.   he is advised to continue his current medications as prescribed by his PCP.  3) Lipids/Hyperlipidemia:    Review of his recent lipid panel from 04/26/23 showed controlled LDL 85.  he is advised to continue Crestor  10 mg daily at bedtime.  Side effects and precautions discussed with him.  4)  Weight/Diet:  his Body mass index is 31.19 kg/m.  -   he is NOT a candidate for weight loss.  Exercise, and detailed carbohydrates information provided  -  detailed on discharge instructions.  5) Chronic Care/Health Maintenance: -he is not on ACEI/ARB and is on Statin medications and is encouraged to initiate and continue to follow up with Ophthalmology, Dentist, Podiatrist at least yearly or according to recommendations, and advised to stay away from smoking. I have recommended yearly flu vaccine and pneumonia vaccine at least every 5 years; moderate intensity exercise for  up to 150 minutes weekly; and sleep for at least 7 hours a day.  - he is advised to maintain close follow up with [name], DO for primary care needs, as well as his other providers for optimal and coordinated care.      I spent  23  minutes in the care of the patient today including review of labs  from CMP, Lipids, Thyroid  Function, Hematology (current and previous including abstractions from other facilities); face-to-face time discussing  his blood glucose readings/logs, discussing hypoglycemia and hyperglycemia episodes and symptoms, medications doses, his options of short and long term treatment based on the latest standards of care / guidelines;  discussion about incorporating lifestyle medicine;  and documenting the encounter. Risk reduction counseling performed per USPSTF guidelines to reduce obesity and cardiovascular risk factors.     Please refer to Patient Instructions for Blood Glucose Monitoring and Insulin /Medications Dosing Guide  in media tab for additional information. Please  also refer to  Patient Self Inventory in the Media  tab for reviewed elements of pertinent patient history.  Robert Faulkner participated in the discussions, expressed understanding, and voiced agreement with the above plans.  All questions were answered to his satisfaction. he is encouraged to contact clinic should he have any questions or concerns prior to his return visit.     Follow up plan: - Return in about 1 year (around 09/20/2024) for Diabetes F/U with A1c in office, No previsit labs.   Benton Rio, [facility] [address] [place], [place] [address] Phone: [phone] Fax: [phone]  09/21/2023, 2:14 PM

## 2023-10-20 ENCOUNTER — Other Ambulatory Visit (HOSPITAL_COMMUNITY)

## 2023-11-01 ENCOUNTER — Ambulatory Visit: Admitting: Family Medicine

## 2023-11-01 ENCOUNTER — Encounter: Payer: Self-pay | Admitting: Family Medicine

## 2023-11-01 VITALS — BP 139/90 | HR 49 | Temp 97.3°F | Ht 71.0 in | Wt 220.0 lb

## 2023-11-01 DIAGNOSIS — E1169 Type 2 diabetes mellitus with other specified complication: Secondary | ICD-10-CM

## 2023-11-01 DIAGNOSIS — H11002 Unspecified pterygium of left eye: Secondary | ICD-10-CM

## 2023-11-01 DIAGNOSIS — I152 Hypertension secondary to endocrine disorders: Secondary | ICD-10-CM | POA: Diagnosis not present

## 2023-11-01 DIAGNOSIS — E785 Hyperlipidemia, unspecified: Secondary | ICD-10-CM

## 2023-11-01 DIAGNOSIS — H35033 Hypertensive retinopathy, bilateral: Secondary | ICD-10-CM

## 2023-11-01 DIAGNOSIS — Z23 Encounter for immunization: Secondary | ICD-10-CM

## 2023-11-01 DIAGNOSIS — E1159 Type 2 diabetes mellitus with other circulatory complications: Secondary | ICD-10-CM | POA: Diagnosis not present

## 2023-11-01 MED ORDER — PROPRANOLOL HCL 10 MG PO TABS: 10.0000 mg | ORAL_TABLET | Freq: Three times a day (TID) | ORAL | 3 refills | Status: AC

## 2023-11-01 MED ORDER — ROSUVASTATIN CALCIUM 20 MG PO TABS
20.0000 mg | ORAL_TABLET | Freq: Every day | ORAL | 3 refills | Status: AC
Start: 2023-11-01 — End: ?

## 2023-11-01 NOTE — Progress Notes (Signed)
 Subjective: CC:DM PCP: Robert Norene HERO, DO YEP:Robert Faulkner is a 78 y.o. male presenting to clinic today for:  Discussed the use of AI scribe software for clinical note transcription with the patient, who gave verbal consent to proceed.  History of Present Illness   Robert Faulkner is an 78 year old male with diabetes who presents for a follow-up visit. He is accompanied to today's visit by his son Robert Faulkner.  His blood sugar levels are well-controlled, with recent readings around 120-122 mg/dL and no episodes of hypoglycemia. His last A1c, performed about a month and a half ago, was within normal limits. He continues to monitor his diet to manage his diabetes.  Though he admits to eating some pork which is why he thinks his BP is up today.  He is scheduled for surgery on November 15, 2023, to address a growth behind his left eye. There is no pain or vision changes associated with this growth, which was discovered incidentally during routine tests. He also has two cataracts that require removal.  No chest pain, shortness of breath, frequent urination, numbness, or tingling in feet. No falls reported. There is some asymmetrical sensation loss in his right foot.  He plans to receive a flu shot soon. He recently purchased a new chainsaw.        Diabetes Health Maintenance Due  Topic Date Due   FOOT EXAM  10/19/2023   HEMOGLOBIN A1C  03/23/2024   OPHTHALMOLOGY EXAM  04/25/2024    ROS: Per HPI  No Known Allergies Past Medical History:  Diagnosis Date   Diabetes mellitus without complication (HCC)    Essential tremor    Hypertension    Tremors of nervous system     Current Outpatient Medications:    meclizine  (ANTIVERT ) 25 MG tablet, Take 1 tablet (25 mg total) by mouth 3 (three) times daily as needed for dizziness., Disp: 30 tablet, Rfl: 5   Multiple Vitamin (ONE-DAILY MULTI-VITAMIN PO), Take by mouth daily., Disp: , Rfl:    naproxen  sodium (ALEVE ) 220 MG tablet, Take  220 mg by mouth. Patient states that he takes 2 by mouth every morning and 1 at bedtime, Disp: , Rfl:    propranolol  (INDERAL ) 10 MG tablet, Take 1 tablet (10 mg total) by mouth 3 (three) times daily., Disp: 300 tablet, Rfl: 3   rosuvastatin  (CRESTOR ) 20 MG tablet, Take 1 tablet (20 mg total) by mouth daily. Dose change, Disp: 100 tablet, Rfl: 3 Social History   Socioeconomic History   Marital status: Widowed    Spouse name: Not on file   Number of children: 3   Years of education: Not on file   Highest education level: 8th grade  Occupational History   Occupation: retired    Comment: [place]  [employer]  Tobacco Use   Smoking status: Former    Current packs/day: 0.00    Average packs/day: 2.0 packs/day for 11.0 years (22.0 ttl pk-yrs)    Types: Cigarettes    Start date: 09/21/1966    Quit date: 09/20/1977    Years since quitting: 46.1   Smokeless tobacco: Never  Vaping Use   Vaping status: Never Used  Substance and Sexual Activity   Alcohol use: No   Drug use: No   Sexual activity: Not on file  Other Topics Concern   Not on file  Social History Narrative   Lives in 2 story home alone - wife passed in 2022 - his children live nearby and visit often   He's  involved in church   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, cuts wood, always working)   Social Drivers of Corporate investment banker Strain: Low Risk  (08/17/2023)   Overall Financial Resource Strain (CARDIA)    Difficulty of Paying Living Expenses: Not hard at all  Food Insecurity: No Food Insecurity (08/17/2023)   Hunger Vital Sign    Worried About Running Out of Food in the Last Year: Never true    Ran Out of Food in the Last Year: Never true  Transportation Needs: No Transportation Needs (08/17/2023)   PRAPARE - Administrator, Civil Service (Medical): No    Lack of Transportation (Non-Medical): No  Physical Activity: Insufficiently Active (08/17/2023)   Exercise Vital Sign    Days  of Exercise per Week: 4 days    Minutes of Exercise per Session: 30 min  Stress: No Stress Concern Present (08/17/2023)   [employer] of Occupational Health - Occupational Stress Questionnaire    Feeling of Stress: Not at all  Social Connections: Moderately Integrated (08/17/2023)   Social Connection and Isolation Panel    Frequency of Communication with Friends and Family: More than three times a week    Frequency of Social Gatherings with Friends and Family: More than three times a week    Attends Religious Services: More than 4 times per year    Active Member of [employer] or Organizations: Yes    Attends Banker Meetings: More than 4 times per year    Marital Status: Widowed  Intimate Partner Violence: Not At Risk (05/07/2023)   Humiliation, Afraid, Rape, and Kick questionnaire    Fear of Current or Ex-Partner: No    Emotionally Abused: No    Physically Abused: No    Sexually Abused: No   Family History  Problem Relation Age of Onset   Stroke Mother    Stroke Father    Arthritis Son    Hyperlipidemia Son    Hypertension Son    Diabetes Son    Colon cancer Neg Hx     Objective: Office vital signs reviewed. BP (!) 139/90   Pulse (!) 49   Temp (!) 97.3 F (36.3 C)   Ht 5' 11 (1.803 m)   Wt 220 lb (99.8 kg)   SpO2 93%   BMI 30.68 kg/m   Physical Examination:  General: Awake, alert, well nourished, No acute distress HEENT: sclera white, MMM Cardio: bradycardic rate and regular rhythm, S1S2 heard, no murmurs appreciated Pulm: clear to auscultation bilaterally, no wheezes, rhonchi or rales; normal work of breathing on room air Extremities: cool, No edema, cyanosis or clubbing; +2 pulses bilaterally    Diabetic Foot Exam - Simple   Simple Foot Form Diabetic Foot exam was performed with the following findings: Yes 11/01/2023  9:49 AM  Visual Inspection No deformities, no ulcerations, no other skin breakdown bilaterally: Yes Sensation Testing See comments:  Yes Pulse Check Posterior Tibialis and Dorsalis pulse intact bilaterally: Yes Comments Absent monofilament sensation in digits 1 and 2 on the right and digits 3 on the left     Lab Results  Component Value Date   HGBA1C 6.4 (A) 09/21/2023    Assessment/ Plan: 78 y.o. male   Hypertension associated with diabetes (HCC) - Plan: propranolol  (INDERAL ) 10 MG tablet  Hypertensive retinopathy of both eyes  Type 2 diabetes mellitus with other specified complication, without long-term current use of insulin  (HCC)  Hyperlipidemia associated with type 2 diabetes mellitus (  HCC) - Plan: rosuvastatin  (CRESTOR ) 20 MG tablet  Pterygium, left  Encounter for immunization - Plan: Flu vaccine HIGH DOSE PF(Fluzone Trivalent)  Assessment and Plan    Type 2 diabetes mellitus with HTN, HLD; Blood sugar levels controlled, A1c normal at recent Endo visit.  Asymmetrical sensory loss in feet likely non-diabetic nerve damage.  BP controlled at recheck. - Perform foot exam to monitor for complications. - Monitor for sores or drainage on feet and report immediately. - Continue all meds as prescribed.  What sounded like a mass is actually a pterygium per the EMR notes, planned for surgical removal. Known hypertensive retinopathy and under care of eye doctor for this.  Scheduled for surgical removal on November 15, 2023. No pain or vision issues. Unclear post-surgery vision impact, no expected loss communicated. - Proceed with scheduled eye surgery on November 15, 2023.   - Administer flu shot.      Norene CHRISTELLA Fielding, DO [facility] [phone]

## 2023-11-09 NOTE — Progress Notes (Signed)
 [facility] - Clinic Note  11/22/2023   CHIEF COMPLAINT Patient presents for Retina Follow Up  HISTORY OF PRESENT ILLNESS: Robert Faulkner is a 78 y.o. male who presents to the clinic today for:  HPI     Retina Follow Up   Diagnosis: Dilated, tortuous venules from Hypertensive Retinopathy.  In both eyes.  This started 3 months ago.  Severity is moderate.  Duration of 3 months.  I, the attending physician,  performed the HPI with the patient and updated documentation appropriately.        Comments   Pt states he had a growth removed from eye with [name] 9.22.25 and has a follow up 12/02/23. Pt is using antibiotic ointment, Tobradex , qid OS-and wants to know how long to keep using it. Pt states no problems at all, little sore for 10 hours after sx but couldn't ask for any better! Pt states before he puts in the ointment vision is clear as a crystal.      Last edited by [name], MD on 11/22/2023  1:58 PM.     Pt states had growth removed w/ Dr. [name] on 9.22.25 in [place]. Doing well.    Referring physician: [name], DO [address] [place],  [place] [address]  HISTORICAL INFORMATION:  Selected notes from the MEDICAL RECORD NUMBER Referred by Dr. Jolinda for dilated and tortuous venules OU - found during in-office diabetic eye screening LEE:  Ocular Hx- PMH-   CURRENT MEDICATIONS: No current outpatient medications on file. (Ophthalmic Drugs)   No current facility-administered medications for this visit. (Ophthalmic Drugs)   Current Outpatient Medications (Other)  Medication Sig   meclizine  (ANTIVERT ) 25 MG tablet Take 1 tablet (25 mg total) by mouth 3 (three) times daily as needed for dizziness.   Multiple Vitamin (ONE-DAILY MULTI-VITAMIN PO) Take by mouth daily.   naproxen  sodium (ALEVE ) 220 MG tablet Take 220 mg by mouth. Patient states that he takes 2 by mouth every morning and 1 at bedtime   propranolol  (INDERAL ) 10 MG tablet  Take 1 tablet (10 mg total) by mouth 3 (three) times daily.   rosuvastatin  (CRESTOR ) 20 MG tablet Take 1 tablet (20 mg total) by mouth daily. Dose change   No current facility-administered medications for this visit. (Other)   REVIEW OF SYSTEMS: ROS   Positive for: Endocrine, Cardiovascular, Eyes Last edited by [name], COT on 11/22/2023  9:22 AM.      ALLERGIES No Known Allergies PAST MEDICAL HISTORY Past Medical History:  Diagnosis Date   Diabetes mellitus without complication (HCC)    Essential tremor    Hypertension    Tremors of nervous system    Past Surgical History:  Procedure Laterality Date   COLONOSCOPY N/A 09/21/2014   Procedure: COLONOSCOPY;  Surgeon: Margo LITTIE Haddock, MD;  Location: [facility];  Service: Endoscopy;  Laterality: N/A;  1200 - moved to 10:30 - office to notify   FAMILY HISTORY Family History  Problem Relation Age of Onset   Stroke Mother    Stroke Father    Arthritis Son    Hyperlipidemia Son    Hypertension Son    Diabetes Son    Colon cancer Neg Hx    SOCIAL HISTORY Social History   Tobacco Use   Smoking status: Former    Current packs/day: 0.00    Average packs/day: 2.0 packs/day for 11.0 years (22.0 ttl pk-yrs)    Types: Cigarettes  Start date: 09/21/1966    Quit date: 09/20/1977    Years since quitting: 46.2   Smokeless tobacco: Never  Vaping Use   Vaping status: Never Used  Substance Use Topics   Alcohol use: No   Drug use: No       OPHTHALMIC EXAM:  Base Eye Exam     Visual Acuity (Snellen - Linear)       Right Left   Dist cc 20/30 -1 20/200   Dist ph cc NI 20/150 (ointment in eye)    Correction: Glasses         Tonometry (Tonopen, 9:18 AM)       Right Left   Pressure 22 22         Pupils       Pupils Dark Light Shape React APD   Right PERRL 3 2 Round Brisk None   Left PERRL 3 2 Round Brisk None         Visual Fields       Left Right    Full Full         Extraocular Movement        Right Left    Full, Ortho Full, Ortho         Neuro/Psych     Oriented x3: Yes   Mood/Affect: Normal         Dilation     Both eyes: 1.0% Mydriacyl, 2.5% Phenylephrine @ 9:20 AM           Slit Lamp and Fundus Exam     Slit Lamp Exam       Right Left   Lids/Lashes Dermatochalasis - upper lid Dermatochalasis - upper lid, mild MGD   Conjunctiva/Sclera nasal pingeucula s/p pterygium excision temporally, sutures intact, mild heme, nasal pingeucula   Cornea tear film debris 2+ Punctate epithelial erosions, temporal pterygium with calcifications--s/p excision   Anterior Chamber moderate depth, narrow angles moderate depth, narrow angles   Iris Round and dilated, No NVI Round and dilated, No NVI, +PPM   Lens 2-3+ Nuclear sclerosis, 2-3+ Cortical cataract, Pseudoexfoliative material with bullseye pattern 2-3+ Nuclear sclerosis, 2-3+ Cortical cataract   Anterior Vitreous mild syneresis mild syneresis         Fundus Exam       Right Left   Disc Pink and Sharp, +PPP Pink and Sharp   C/D Ratio 0.2 0.2   Macula Flat, Blunted foveal reflex, RPE mottling and clumping, Drusen, no heme or edema Flat, Blunted foveal reflex, Drusen, RPE mottling and clumping, no heme or edema   Vessels Attenuated arterioles, 3-4+ tortuosity venules Attenuated arterioles, 3-4+ tortuosity venules   Periphery Attached, reticular degeneration, No heme Attached, reticular degeneration, No heme           Refraction     Wearing Rx       Sphere Cylinder Axis Add   Right +4.50 +0.50 015 +2.75   Left +4.75 +0.75 105 +2.75           IMAGING AND PROCEDURES  Imaging and Procedures for 11/22/2023  OCT, Retina - OU - Both Eyes       Right Eye Quality was good. Central Foveal Thickness: 264. Progression has been stable. Findings include normal foveal contour, no IRF, no SRF, retinal drusen (Dilated and tortuous venules seen on en face).   Left Eye Quality was good. Central Foveal  Thickness: 253. Progression has been stable. Findings include normal foveal contour, no IRF, no SRF, retinal drusen (Dilated  and tortuous venules seen on en face, partial PVD).   Notes *Images captured and stored on drive  Diagnosis / Impression:  Dilated and tortuous venules seen on en face OU -- stable No IRF/SRF OU  Clinical management:  See below  Abbreviations: NFP - Normal foveal profile. CME - cystoid macular edema. PED - pigment epithelial detachment. IRF - intraretinal fluid. SRF - subretinal fluid. EZ - ellipsoid zone. ERM - epiretinal membrane. ORA - outer retinal atrophy. ORT - outer retinal tubulation. SRHM - subretinal hyper-reflective material. IRHM - intraretinal hyper-reflective material            ASSESSMENT/PLAN:   ICD-10-CM   1. Hypertensive retinopathy of both eyes  H35.033 OCT, Retina - OU - Both Eyes    2. Essential hypertension  I10     3. Diabetes mellitus type 2 without retinopathy (HCC)  E11.9     4. Combined forms of age-related cataract of both eyes  H25.813     5. Pseudoexfoliation (PXF) of right lens capsule  H26.8     6. Pterygium of left eye  H11.002       1,2. Dilated, tortuous venules from Hypertensive Retinopathy OU  - no associated hemorrhage or exudation  - OCT without macular edema  - FA 06.23.25 without leakage  - discussed findings, prognosis - discussed importance of tight BP control - no retinal or ophthalmic interventions indicated or recommended  - monitor - f/u 9 months, DFE, OCT  3. Diabetes mellitus, type 2 without retinopathy  - A1c 6.4 on 7.29.25, 6.3 on 03.03.25 - The incidence, risk factors for progression, natural history and treatment options for diabetic retinopathy  were discussed with patient.   - The need for close monitoring of blood glucose, blood pressure, and serum lipids, avoiding cigarette or any type of tobacco, and the need for long term follow up was also discussed with patient. - f/u in as  above  4,5. Mixed Cataract OU  - pseudoexfoliation of lens capsule OD -- bullseye pattern - The symptoms of cataract, surgical options, and treatments and risks were discussed with patient. - discussed diagnosis and progression - referred to [facility] for cataract evaluation -- now following w/ Dr. Harrie  6. Pterygium OS  - s/p surgical excision - [place]  - stable, monitor   Ophthalmic Meds Ordered this visit:  No orders of the defined types were placed in this encounter.    Return in about 9 months (around 08/21/2024) for HTN RET OU, DFE, OCT.  There are no Patient Instructions on file for this visit.  Explained the diagnoses, plan, and follow up with the patient and they expressed understanding.  Patient expressed understanding of the importance of proper follow up care.   This document serves as a record of services personally performed by Redell JUDITHANN Hans, MD, PhD. It was created on their behalf by Avelina Pereyra, COA an ophthalmic technician. The creation of this record is the provider's dictation and/or activities during the visit.   Electronically signed by: Avelina GORMAN Pereyra, COT  11/22/23  2:02 PM   Redell JUDITHANN Hans, M.D., Ph.D. Diseases & Surgery of the Retina and Vitreous [facility] 11/22/2023  I have reviewed the above documentation for accuracy and completeness, and I agree with the above. Redell JUDITHANN Hans, M.D., Ph.D. 11/22/23 2:02 PM   Abbreviations: M myopia (nearsighted); A astigmatism; H hyperopia (farsighted); P presbyopia; Mrx spectacle prescription;  CTL contact lenses; OD right eye;  OS left eye; OU both eyes  XT exotropia; ET esotropia; PEK punctate epithelial keratitis; PEE punctate epithelial erosions; DES dry eye syndrome; MGD meibomian gland dysfunction; ATs artificial tears; PFAT's preservative free artificial tears; NSC nuclear sclerotic cataract; PSC posterior subcapsular cataract; ERM epi-retinal membrane; PVD  posterior vitreous detachment; RD retinal detachment; DM diabetes mellitus; DR diabetic retinopathy; NPDR non-proliferative diabetic retinopathy; PDR proliferative diabetic retinopathy; CSME clinically significant macular edema; DME diabetic macular edema; dbh dot blot hemorrhages; CWS cotton wool spot; POAG primary open angle glaucoma; C/D cup-to-disc ratio; HVF humphrey visual field; GVF goldmann visual field; OCT optical coherence tomography; IOP intraocular pressure; BRVO Branch retinal vein occlusion; CRVO central retinal vein occlusion; CRAO central retinal artery occlusion; BRAO branch retinal artery occlusion; RT retinal tear; SB scleral buckle; PPV pars plana vitrectomy; VH Vitreous hemorrhage; PRP panretinal laser photocoagulation; IVK intravitreal kenalog; VMT vitreomacular traction; MH Macular hole;  NVD neovascularization of the disc; NVE neovascularization elsewhere; AREDS age related eye disease study; ARMD age related macular degeneration; POAG primary open angle glaucoma; EBMD epithelial/anterior basement membrane dystrophy; ACIOL anterior chamber intraocular lens; IOL intraocular lens; PCIOL posterior chamber intraocular lens; Phaco/IOL phacoemulsification with intraocular lens placement; PRK photorefractive keratectomy; LASIK laser assisted in situ keratomileusis; HTN hypertension; DM diabetes mellitus; COPD chronic obstructive pulmonary disease

## 2023-11-10 NOTE — H&P (Signed)
 Surgical History & Physical  Patient Name: Robert Faulkner  DOB: 1945-05-15  Surgery: Ptergyiemectomy; Left Eye Surgeon: Lynwood Hermann MD Surgery Date: 11/15/2023 Pre-Op Date: 09/13/2023  HPI: A 89 Yr. old male patient 1. The patient is a new patient present for a Cataract Evaluation. The patient complains of difficulty when reading fine print, books, newspaper, instructions etc., which began 1 year ago. Both eyes are affected. The episode is constant. The patient describes foggy symptoms affecting their eyes/vision. This is negatively affecting the patient's quality of life and the patient is unable to function adequately in life with the current level of vision. HPI Completed by Dr. Lynwood Hermann  Medical History: Cataracts  Arthritis High Blood Pressure  Review of Systems Cardiovascular High Blood Pressure, high cholesterol Musculoskeletal arthritis All recorded systems are negative except as noted above.  Social Never smoked  Medication Neomycin-polymyxin B-dexameth,  Propranolol , Meclizine , Rosuvastatin   Sx/Procedures None  Drug Allergies  NKDA  History & Physical: Heent: pterygium, cataracts NECK: supple without bruits LUNGS: lungs clear to auscultation CV: regular rate and rhythm Abdomen: soft and non-tender  Impression & Plan: Assessment: 1.  PTERYGIUM PERIPHERAL PROGRESSIVE; Left Eye (H11.052) 2.  NUCLEAR SCLEROSIS AGE RELATED; Both Eyes (H25.13) 3.  BLEPHARITIS; Right Lower Lid, Left Upper Lid, Left Lower Lid, Right Upper Lid (H01.001, H01.002,H01.004,H01.005) 4.  DERMATOCHALASIS, no surgery; Right Upper Lid, Right Lower Lid, Left Upper Lid, Left Lower Lid (H02.831, H02.832,H02.834,H02.835) 5.  Pinguecula; Right Eye (H11.151)  Plan: 1.  Explained the presence and development of the pterygium and it's relation to UV exposure, wind and irritation from dry spots. Findings, prognosis and treatment options reviewed. Clinically/visually significant, recommend  excision. The risk, benefits, alternatives, and elective nature of the procedure were discussed. All questions answered. Schedule for Pterygium excision, amniotic membrane graft with Tisseel, application of Mitomycin-C with biopsy. Risks, benefits, alternatives, and complications discussed.  2.  Visually significant. Pterygium is affecting measurements for left eye. Will address after Pterygium surgery. will repeat all measurements after pterygium surgery.  3.  Blepharitis is present - recommend regular lid cleaning.  4.  Asymptomatic, recommend observation for now. Findings, prognosis and treatment options reviewed.  5.  Observe; Artificial tears as needed for irritation.

## 2023-11-11 ENCOUNTER — Encounter (HOSPITAL_COMMUNITY)
Admission: RE | Admit: 2023-11-11 | Discharge: 2023-11-11 | Disposition: A | Discharge status: Home / Self Care | Payer: Self-pay | Source: Ambulatory Visit | Attending: Ophthalmology | Admitting: Ophthalmology

## 2023-11-11 NOTE — Pre-Procedure Instructions (Signed)
Attempted pre-op phone call. Left VM for him to call back.

## 2023-11-12 ENCOUNTER — Other Ambulatory Visit: Payer: Self-pay

## 2023-11-12 ENCOUNTER — Encounter (HOSPITAL_COMMUNITY): Payer: Self-pay

## 2023-11-15 ENCOUNTER — Encounter (HOSPITAL_COMMUNITY): Payer: Self-pay | Admitting: Ophthalmology

## 2023-11-15 ENCOUNTER — Ambulatory Visit (HOSPITAL_BASED_OUTPATIENT_CLINIC_OR_DEPARTMENT_OTHER): Admitting: Anesthesiology

## 2023-11-15 ENCOUNTER — Ambulatory Visit (HOSPITAL_COMMUNITY)
Admission: RE | Admit: 2023-11-15 | Discharge: 2023-11-15 | Disposition: A | Discharge status: Home / Self Care | Attending: Ophthalmology | Admitting: Ophthalmology

## 2023-11-15 ENCOUNTER — Other Ambulatory Visit: Payer: Self-pay

## 2023-11-15 ENCOUNTER — Encounter (HOSPITAL_COMMUNITY)
Admission: RE | Disposition: A | Discharge status: Home / Self Care | Payer: Self-pay | Source: Home / Self Care | Attending: Ophthalmology

## 2023-11-15 ENCOUNTER — Ambulatory Visit (HOSPITAL_COMMUNITY): Admitting: Anesthesiology

## 2023-11-15 DIAGNOSIS — H11052 Peripheral pterygium, progressive, left eye: Secondary | ICD-10-CM

## 2023-11-15 DIAGNOSIS — H02831 Dermatochalasis of right upper eyelid: Secondary | ICD-10-CM | POA: Insufficient documentation

## 2023-11-15 DIAGNOSIS — I10 Essential (primary) hypertension: Secondary | ICD-10-CM | POA: Diagnosis not present

## 2023-11-15 DIAGNOSIS — H02832 Dermatochalasis of right lower eyelid: Secondary | ICD-10-CM | POA: Diagnosis not present

## 2023-11-15 DIAGNOSIS — H02834 Dermatochalasis of left upper eyelid: Secondary | ICD-10-CM | POA: Insufficient documentation

## 2023-11-15 DIAGNOSIS — H0100A Unspecified blepharitis right eye, upper and lower eyelids: Secondary | ICD-10-CM | POA: Diagnosis not present

## 2023-11-15 DIAGNOSIS — E1136 Type 2 diabetes mellitus with diabetic cataract: Secondary | ICD-10-CM | POA: Insufficient documentation

## 2023-11-15 DIAGNOSIS — H02835 Dermatochalasis of left lower eyelid: Secondary | ICD-10-CM | POA: Diagnosis not present

## 2023-11-15 DIAGNOSIS — H11151 Pinguecula, right eye: Secondary | ICD-10-CM | POA: Insufficient documentation

## 2023-11-15 DIAGNOSIS — H0100B Unspecified blepharitis left eye, upper and lower eyelids: Secondary | ICD-10-CM | POA: Diagnosis not present

## 2023-11-15 DIAGNOSIS — H2513 Age-related nuclear cataract, bilateral: Secondary | ICD-10-CM | POA: Insufficient documentation

## 2023-11-15 DIAGNOSIS — E119 Type 2 diabetes mellitus without complications: Secondary | ICD-10-CM | POA: Diagnosis not present

## 2023-11-15 HISTORY — PX: PTERYGIUM EXCISION: SHX2273

## 2023-11-15 LAB — GLUCOSE, CAPILLARY: Glucose-Capillary: 87 mg/dL (ref 70–99)

## 2023-11-15 SURGERY — EXCISION, PTERYGIUM
Anesthesia: Monitor Anesthesia Care | Site: Eye | Laterality: Left

## 2023-11-15 MED ORDER — TETRACAINE HCL 0.5 % OP SOLN
OPHTHALMIC | Status: AC
  Filled 2023-11-15: qty 4

## 2023-11-15 MED ORDER — POVIDONE-IODINE 5 % OP SOLN
OPHTHALMIC | Status: DC | PRN
  Administered 2023-11-15: 1 via OPHTHALMIC

## 2023-11-15 MED ORDER — LIDOCAINE-EPINEPHRINE (PF) 2 %-1:200000 IJ SOLN
20.0000 mL | Freq: Once | INTRAMUSCULAR | Status: DC
  Filled 2023-11-15: qty 20

## 2023-11-15 MED ORDER — LIDOCAINE-EPINEPHRINE (PF) 1 %-1:200000 IJ SOLN
INTRAMUSCULAR | Status: AC
  Filled 2023-11-15: qty 30

## 2023-11-15 MED ORDER — TOBRAMYCIN-DEXAMETHASONE 0.3-0.1 % OP OINT
TOPICAL_OINTMENT | OPHTHALMIC | Status: DC | PRN
  Administered 2023-11-15: 1 via OPHTHALMIC

## 2023-11-15 MED ORDER — MIDAZOLAM HCL 2 MG/2ML IJ SOLN
INTRAMUSCULAR | Status: AC
  Filled 2023-11-15: qty 2

## 2023-11-15 MED ORDER — TOBRAMYCIN-DEXAMETHASONE 0.3-0.1 % OP OINT
TOPICAL_OINTMENT | Freq: Once | OPHTHALMIC | Status: DC
  Filled 2023-11-15: qty 3.5

## 2023-11-15 MED ORDER — TETRACAINE 0.5 % OP SOLN OPTIME - NO CHARGE
OPHTHALMIC | Status: DC | PRN
  Administered 2023-11-15: 1 [drp] via OPHTHALMIC

## 2023-11-15 MED ORDER — MIDAZOLAM HCL 5 MG/5ML IJ SOLN
INTRAMUSCULAR | Status: DC | PRN
  Administered 2023-11-15: 1 mg via INTRAVENOUS

## 2023-11-15 MED ORDER — SODIUM CHLORIDE 0.9% FLUSH
INTRAVENOUS | Status: DC | PRN
  Administered 2023-11-15: 10 mL via INTRAVENOUS

## 2023-11-15 MED ORDER — BALANCED SALT IO SOLN
INTRAOCULAR | Status: DC | PRN
  Administered 2023-11-15: 15 mL via INTRAOCULAR

## 2023-11-15 MED ORDER — LIDOCAINE HCL 3.5 % OP GEL
1.0000 | Freq: Once | OPHTHALMIC | Status: AC
Start: 2023-11-15 — End: 2023-11-15
  Administered 2023-11-15: 1 via OPHTHALMIC

## 2023-11-15 MED ORDER — LIDOCAINE-EPINEPHRINE (PF) 2 %-1:200000 IJ SOLN
INTRAMUSCULAR | Status: DC | PRN
  Administered 2023-11-15: 1 mL via INTRADERMAL

## 2023-11-15 SURGICAL SUPPLY — 20 items
APPLICATOR COTTON TIP 6 STRL (MISCELLANEOUS) ×4 IMPLANT
CLOTH BEACON ORANGE TIMEOUT ST (SAFETY) ×1 IMPLANT
CNTNR URN SCR LID CUP LEK RST (MISCELLANEOUS) IMPLANT
CORD 12FT FOR BIPOLAR SWEEP (MISCELLANEOUS) IMPLANT
COUNTER NDL MAGNETIC 40 RED (SET/KITS/TRAYS/PACK) ×1 IMPLANT
COUNTER NEEDLE MAGNETIC 40 RED (SET/KITS/TRAYS/PACK) ×1 IMPLANT
EYE SHIELD UNIVERSAL CLEAR (GAUZE/BANDAGES/DRESSINGS) ×1 IMPLANT
GAUZE SPONGE 4X4 12PLY STRL (GAUZE/BANDAGES/DRESSINGS) IMPLANT
GLOVE BIOGEL PI IND STRL 7.0 (GLOVE) ×2 IMPLANT
NDL HYPO 18GX1.5 BLUNT FILL (NEEDLE) ×2 IMPLANT
NDL HYPO 25X5/8 SAFETYGLIDE (NEEDLE) IMPLANT
NEEDLE HYPO 18GX1.5 BLUNT FILL (NEEDLE) ×2 IMPLANT
NEEDLE HYPO 25X5/8 SAFETYGLIDE (NEEDLE) ×1 IMPLANT
PAD TELFA 3X4 1S STER (GAUZE/BANDAGES/DRESSINGS) ×1 IMPLANT
PENCIL BIPOLAR NONSTICK 18G (MISCELLANEOUS) IMPLANT
SUT VICRYL 8 0 TG140 8 (SUTURE) ×1 IMPLANT
SYR CONTROL 10ML LL (SYRINGE) ×1 IMPLANT
TAPE SURG TRANSPORE 1 IN (GAUZE/BANDAGES/DRESSINGS) IMPLANT
TOWEL OR 17X26 4PK STRL BLUE (TOWEL DISPOSABLE) ×1 IMPLANT
WATER STERILE IRR 250ML POUR (IV SOLUTION) ×1 IMPLANT

## 2023-11-15 NOTE — Transfer of Care (Addendum)
 Immediate Anesthesia Transfer of Care Note  Patient: Robert Faulkner  Procedure(s) Performed: EXCISION, PTERYGIUM (Left: Eye)  Patient Location: Short Stay  Anesthesia Type:MAC  Level of Consciousness: awake and patient cooperative  Airway & Oxygen Therapy: Patient Spontanous Breathing  Post-op Assessment: Report given to RN and Post -op Vital signs reviewed and stable  Post vital signs: Reviewed and stable  Last Vitals:  Vitals Value Taken Time  BP 145/66 11/15/23   14:44  Temp 36.7 C 11/15/23 14:44  Pulse 66 11/15/23 14:44  Resp 13 11/15/23   14:44  SpO2 96 % 11/15/23 14:44    Last Pain:  Vitals:   11/15/23 1444  TempSrc: Oral  PainSc: 0-No pain         Complications: No notable events documented.

## 2023-11-15 NOTE — Op Note (Signed)
 Date of procedure: 11/15/23  Pre-operative diagnosis:  Visually significant pterygium, Left eye  Post-operative diagnosis:  Visually significant pterygium, Left eye  Procedure: Excision of Pterygium with placement of conjunctival autograft, Left Eye  Attending surgeon: Lynwood LABOR. German Manke, MD, MA  Anesthesia: MAC, Topical Akten   Complications: None  Estimated Blood Loss: <77mL (minimal)  Specimens: Pterygium, Left Eye  Implants: None  Indications:  Visually significant pterygium, Left eye  Procedure:  The patient was seen and identified in the pre-operative area. The operative eye was identified.  The operative eye was marked.  Topical anesthesia was administered to the operative eye.     The patient was then to the operative suite and placed in the supine position.  A timeout was performed confirming the patient, procedure to be performed, and all other relevant information.   The patient's face was prepped and draped in the usual fashion for intra-ocular surgery.  A lid speculum was placed into the operative eye and the surgical microscope moved into place and focused.  The pterygium was marked using a marking pen.  The head of the pterygium was bisected.  Further removal of conjunctiva and tenons capsule was performed.  The pteryguim was then removed from the cornea carefully using forceps. The pterygium was sent in formalin for surgical pathology. A burr was used to polish the cornea. The cornea was found to be free of the pterygium and clear.   The defect left by the excision was measured.  A 6mm x 8mm section of conjunctival was harvested from the superior conjunctival bed in an area covered by the upper eyelid. The autograft was sutured into place using interrupted 8-0 Vicryl sutures. The speculum was removed.  The drape was removed.  The patient's face was cleaned with wet and dry 4x4s.  TobraDex  ointment was placed in the eye.  A patch and hard shield was placed over the eye.  The  patient tolerated the procedure well and was brought to post-op in good condition.  Post-Op Instructions: The patient will follow up at [facility] in the days for post-operative evaluation and will receive all other orders and instructions.

## 2023-11-15 NOTE — Interval H&P Note (Signed)
 History and Physical Interval Note:  11/15/2023 1:37 PM  Robert Faulkner  has presented today for surgery, with the diagnosis of progressive peripheral pterygium, left eye.  The various methods of treatment have been discussed with the patient and family. After consideration of risks, benefits and other options for treatment, the patient has consented to  Procedure(s) with comments: EXCISION, PTERYGIUM (Left) - pterygiectomy with amniotic membrane with tisseel and [facility] please schedule at the end of the day as a surgical intervention.  The patient's history has been reviewed, patient examined, no change in status, stable for surgery.  I have reviewed the patient's chart and labs.  Questions were answered to the patient's satisfaction.     [name]

## 2023-11-15 NOTE — Discharge Instructions (Signed)
 Please discharge patient when stable.  The patient is to leave the patch and shield on tonight.  The patient will remove the patch and shield tomorrow morning and start his post-operative ointment as instructed.  The patient will follow up in 2 weeks as scheduled.

## 2023-11-15 NOTE — Anesthesia Preprocedure Evaluation (Signed)
Anesthesia Evaluation  Patient identified by MRN, date of birth, ID band Patient awake    Reviewed: Allergy & Precautions, H&P , NPO status , Patient's Chart, lab work & pertinent test results, reviewed documented beta blocker date and time   Airway Mallampati: II  TM Distance: >3 FB Neck ROM: full    Dental no notable dental hx.    Pulmonary neg pulmonary ROS, former smoker   Pulmonary exam normal breath sounds clear to auscultation       Cardiovascular Exercise Tolerance: Good hypertension, negative cardio ROS  Rhythm:regular Rate:Normal     Neuro/Psych negative neurological ROS  negative psych ROS   GI/Hepatic negative GI ROS, Neg liver ROS,,,  Endo/Other  negative endocrine ROSdiabetes    Renal/GU negative Renal ROS  negative genitourinary   Musculoskeletal   Abdominal   Peds  Hematology negative hematology ROS (+)   Anesthesia Other Findings   Reproductive/Obstetrics negative OB ROS                             Anesthesia Physical Anesthesia Plan  ASA: 2  Anesthesia Plan: MAC   Post-op Pain Management:    Induction:   PONV Risk Score and Plan:   Airway Management Planned:   Additional Equipment:   Intra-op Plan:   Post-operative Plan:   Informed Consent: I have reviewed the patients History and Physical, chart, labs and discussed the procedure including the risks, benefits and alternatives for the proposed anesthesia with the patient or authorized representative who has indicated his/her understanding and acceptance.     Dental Advisory Given  Plan Discussed with: CRNA  Anesthesia Plan Comments:        Anesthesia Quick Evaluation

## 2023-11-17 LAB — SURGICAL PATHOLOGY

## 2023-11-18 NOTE — Anesthesia Postprocedure Evaluation (Signed)
 Anesthesia Post Note  Patient: Carlin Crete  Procedure(s) Performed: EXCISION, PTERYGIUM (Left: Eye)  Patient location during evaluation: Phase II Anesthesia Type: MAC Level of consciousness: awake Pain management: pain level controlled Vital Signs Assessment: post-procedure vital signs reviewed and stable Respiratory status: spontaneous breathing and respiratory function stable Cardiovascular status: blood pressure returned to baseline and stable Postop Assessment: no headache and no apparent nausea or vomiting Anesthetic complications: no Comments: Late entry   No notable events documented.   Last Vitals:  Vitals:   11/15/23 1257 11/15/23 1444  BP: (!) 156/87 (!) 145/66  Pulse: 63 66  Resp: 18 13  Temp: 36.6 C 36.7 C  SpO2: 97% 96%    Last Pain:  Vitals:   11/16/23 1421  TempSrc:   PainSc: 0-No pain                 Yvonna JINNY Bosworth

## 2023-11-22 ENCOUNTER — Ambulatory Visit (INDEPENDENT_AMBULATORY_CARE_PROVIDER_SITE_OTHER): Admitting: Ophthalmology

## 2023-11-22 ENCOUNTER — Encounter (INDEPENDENT_AMBULATORY_CARE_PROVIDER_SITE_OTHER): Payer: Self-pay | Admitting: Ophthalmology

## 2023-11-22 DIAGNOSIS — H11002 Unspecified pterygium of left eye: Secondary | ICD-10-CM

## 2023-11-22 DIAGNOSIS — H35033 Hypertensive retinopathy, bilateral: Secondary | ICD-10-CM

## 2023-11-22 DIAGNOSIS — H268 Other specified cataract: Secondary | ICD-10-CM

## 2023-11-22 DIAGNOSIS — I10 Essential (primary) hypertension: Secondary | ICD-10-CM

## 2023-11-22 DIAGNOSIS — E119 Type 2 diabetes mellitus without complications: Secondary | ICD-10-CM

## 2023-11-22 DIAGNOSIS — H25813 Combined forms of age-related cataract, bilateral: Secondary | ICD-10-CM

## 2023-12-16 DIAGNOSIS — H01001 Unspecified blepharitis right upper eyelid: Secondary | ICD-10-CM | POA: Diagnosis not present

## 2023-12-16 DIAGNOSIS — H01002 Unspecified blepharitis right lower eyelid: Secondary | ICD-10-CM | POA: Diagnosis not present

## 2023-12-16 DIAGNOSIS — H2513 Age-related nuclear cataract, bilateral: Secondary | ICD-10-CM | POA: Diagnosis not present

## 2023-12-16 DIAGNOSIS — H01004 Unspecified blepharitis left upper eyelid: Secondary | ICD-10-CM | POA: Diagnosis not present

## 2023-12-27 DIAGNOSIS — H2511 Age-related nuclear cataract, right eye: Secondary | ICD-10-CM | POA: Diagnosis not present

## 2023-12-30 ENCOUNTER — Encounter (HOSPITAL_COMMUNITY)
Admission: RE | Admit: 2023-12-30 | Discharge: 2023-12-30 | Disposition: A | Discharge status: Home / Self Care | Source: Ambulatory Visit | Attending: Ophthalmology | Admitting: Ophthalmology

## 2023-12-31 NOTE — Progress Notes (Signed)
 Robert Faulkner                                          MRN: [id number]   12/31/2023   The [facility] Quality Team Specialist reviewed this patient medical record for the purposes of chart review for care gap closure. The following were reviewed: abstraction for care gap closure-kidney health evaluation for diabetes:eGFR  and uACR.    [facility]I Quality Team

## 2023-12-31 NOTE — H&P (View-Only) (Signed)
 Robert Faulkner                                          MRN: [id number]   12/31/2023   The [facility]BCI Quality Team Specialist reviewed this patient medical record for the purposes of chart review for care gap closure. The following were reviewed: abstraction for care gap closure-kidney health evaluation for diabetes:eGFR  and uACR.    VBCI Quality Team

## 2023-12-31 NOTE — Pre-Procedure Instructions (Signed)
 Surgical History & Physical  Patient Name: Robert Faulkner  DOB: March 01, 1945  Surgery: Cataract extraction with intraocular lens implant phacoemulsification; Right Eye Surgeon: Lynwood Hermann MD Surgery Date: 01/03/2024 Pre-Op Date: 12/16/2023  HPI: A 10 Yr. old male patient 1. The patient is returning after Pterygiectomy. The left eye is affected. Status post pterygiectomy, which began 4 weeks ago: Since the last visit, the affected area is doing well. The patient's vision is stable. Patient is not taking medications only systane per. Patient also here for repeat cataract evaluation. Patient has persistently blurry vision in both eyes, left eye is worse. The blurry vision has been present for several months as is worsening. The patient has glare with driving in the dark and has trouble seeing his television. This is negatively affecting the patient's quality of life and the patient is unable to function adequately in life with the current level of vision. HPI Completed by Dr. Lynwood Hermann  Medical History: Cataracts  Arthritis High Blood Pressure  Review of Systems Cardiovascular High Blood Pressure, high cholesterol Musculoskeletal arthritis All recorded systems are negative except as noted above.  Social Never smoked  Medication Prednisolone-moxiflox-bromfen,  Propranolol , Meclizine , Rosuvastatin   Sx/Procedures Pterygiectomy  Drug Allergies  NKDA  History & Physical: Heent: cataracts NECK: supple without bruits LUNGS: lungs clear to auscultation CV: regular rate and rhythm Abdomen: soft and non-tender  Impression & Plan: Assessment: 1.  NUCLEAR SCLEROSIS AGE RELATED; Both Eyes (H25.13) 2.  ENCOUNTER FOR SURGICAL AFTERCARE FOLLOWING SURGERY ON THE SENSE ORGANS (Z48.810) 3.  BLEPHARITIS; Right Lower Lid, Left Upper Lid, Left Lower Lid, Right Upper Lid (H01.001, H01.002,H01.004,H01.005) 4.  DERMATOCHALASIS, no surgery; Right Upper Lid, Right Lower Lid, Left Upper Lid,  Left Lower Lid (H02.831, H02.832,H02.834,H02.835) 5.  Pinguecula; Right Eye (H11.151) 6.  ASTIGMATISM, REGULAR; Both Eyes (H52.223)  Plan: 1.  Cataract accounts for the patient's decreased vision. This visual impairment is not correctable with a tolerable change in glasses or contact lenses. Cataract surgery with an implantation of a new lens should significantly improve the visual and functional status of the patient. Recommend phacoemulsification with intraocular lens. Discussed all risks, benefits, alternatives, and potential complications. Discussed the procedures and recovery. The patient desires to have surgery. A-scan/Biometry ordered and will be performed for intraocular lens calculations. The surgery will be performed in order to improve vision for driving, reading, and for eye examinations. Recommend Dextenza  for post-operative pain and inflammation. Educational materials provided: Cataract. History of corneal refractive Surgery: None History of Previous Ocular Surgery (PPV, other): Pterygiumectomy OS History of ocular trauma: None No current contact lens use. Pupil Status: Dilates poorly - shugarcaine or Lidocaine +Omidira by protocol, Malyugin Ring Right Eye. first - let left eye heal. Recommend Limbal Relaxing Incision to treat astigmatism. Pseudoexfoliation. Veteran no charge LRI.  2.  S/P Pterygium removal OS with conjunctival autograft. healing well. Preservative Free Artificial tears 1 drop 2-3x/day.  3.  Blepharitis is present - recommend regular lid cleaning.  4.  Asymptomatic, recommend observation for now. Findings, prognosis and treatment options reviewed.  5.  Observe; Artificial tears as needed for irritation.  6.  Recommend Limbal Relaxing Incision to treat astigmatism.

## 2024-01-03 ENCOUNTER — Other Ambulatory Visit: Payer: Self-pay

## 2024-01-03 ENCOUNTER — Ambulatory Visit (HOSPITAL_COMMUNITY): Admitting: Anesthesiology

## 2024-01-03 ENCOUNTER — Ambulatory Visit (HOSPITAL_COMMUNITY)
Admission: RE | Admit: 2024-01-03 | Discharge: 2024-01-03 | Disposition: A | Discharge status: Home / Self Care | Attending: Ophthalmology | Admitting: Ophthalmology

## 2024-01-03 ENCOUNTER — Encounter (HOSPITAL_COMMUNITY)
Admission: RE | Disposition: A | Discharge status: Home / Self Care | Payer: Self-pay | Source: Home / Self Care | Attending: Ophthalmology

## 2024-01-03 ENCOUNTER — Encounter (HOSPITAL_COMMUNITY): Payer: Self-pay | Admitting: Ophthalmology

## 2024-01-03 DIAGNOSIS — Z87891 Personal history of nicotine dependence: Secondary | ICD-10-CM

## 2024-01-03 DIAGNOSIS — I10 Essential (primary) hypertension: Secondary | ICD-10-CM

## 2024-01-03 DIAGNOSIS — H5711 Ocular pain, right eye: Secondary | ICD-10-CM | POA: Diagnosis not present

## 2024-01-03 DIAGNOSIS — H2511 Age-related nuclear cataract, right eye: Secondary | ICD-10-CM | POA: Insufficient documentation

## 2024-01-03 DIAGNOSIS — H52221 Regular astigmatism, right eye: Secondary | ICD-10-CM | POA: Diagnosis not present

## 2024-01-03 DIAGNOSIS — E1136 Type 2 diabetes mellitus with diabetic cataract: Secondary | ICD-10-CM | POA: Diagnosis not present

## 2024-01-03 HISTORY — PX: INSERTION, STENT, DRUG-ELUTING, LACRIMAL CANALICULUS: SHX7453

## 2024-01-03 HISTORY — PX: CATARACT EXTRACTION W/PHACO: SHX586

## 2024-01-03 SURGERY — PHACOEMULSIFICATION, CATARACT, WITH IOL INSERTION
Anesthesia: Monitor Anesthesia Care | Site: Eye | Laterality: Right

## 2024-01-03 MED ORDER — MIDAZOLAM HCL 2 MG/2ML IJ SOLN
INTRAMUSCULAR | Status: AC
  Filled 2024-01-03: qty 2

## 2024-01-03 MED ORDER — LIDOCAINE HCL (PF) 1 % IJ SOLN
INTRAMUSCULAR | Status: DC | PRN
  Administered 2024-01-03: 2 mL

## 2024-01-03 MED ORDER — MOXIFLOXACIN HCL 5 MG/ML IO SOLN
INTRAOCULAR | Status: DC | PRN
  Administered 2024-01-03: .2 mL via OPHTHALMIC

## 2024-01-03 MED ORDER — SODIUM HYALURONATE 10 MG/ML IO SOLUTION
PREFILLED_SYRINGE | INTRAOCULAR | Status: DC | PRN
  Administered 2024-01-03: .85 mL via INTRAOCULAR

## 2024-01-03 MED ORDER — CHLORHEXIDINE GLUCONATE 0.12 % MT SOLN: 15.0000 mL | Freq: Once | OROMUCOSAL | Status: DC

## 2024-01-03 MED ORDER — TETRACAINE HCL 0.5 % OP SOLN
1.0000 [drp] | OPHTHALMIC | Status: AC | PRN
  Administered 2024-01-03 (×3): 1 [drp] via OPHTHALMIC

## 2024-01-03 MED ORDER — BSS IO SOLN
INTRAOCULAR | Status: DC | PRN
  Administered 2024-01-03: 15 mL via INTRAOCULAR

## 2024-01-03 MED ORDER — DEXAMETHASONE 0.4 MG OP INST
VAGINAL_INSERT | OPHTHALMIC | Status: DC | PRN
  Administered 2024-01-03: .4 mg via OPHTHALMIC

## 2024-01-03 MED ORDER — POVIDONE-IODINE 5 % OP SOLN
OPHTHALMIC | Status: DC | PRN
  Administered 2024-01-03: 1 via OPHTHALMIC

## 2024-01-03 MED ORDER — MIDAZOLAM HCL (PF) 2 MG/2ML IJ SOLN
INTRAMUSCULAR | Status: DC | PRN
  Administered 2024-01-03: 1 mg via INTRAVENOUS

## 2024-01-03 MED ORDER — DEXAMETHASONE 0.4 MG OP INST
VAGINAL_INSERT | OPHTHALMIC | Status: AC
Start: 2024-01-03 — End: 2024-01-03
  Filled 2024-01-03: qty 1

## 2024-01-03 MED ORDER — STERILE WATER FOR IRRIGATION IR SOLN
Status: DC | PRN
Start: 2024-01-03 — End: 2024-01-03
  Administered 2024-01-03: 250 mL

## 2024-01-03 MED ORDER — LIDOCAINE HCL 3.5 % OP GEL: 1.0000 | Freq: Once | OPHTHALMIC | Status: DC

## 2024-01-03 MED ORDER — PHENYLEPHRINE HCL 2.5 % OP SOLN
1.0000 [drp] | OPHTHALMIC | Status: AC | PRN
  Administered 2024-01-03 (×3): 1 [drp] via OPHTHALMIC

## 2024-01-03 MED ORDER — BSS IO SOLN
INTRAOCULAR | Status: DC | PRN
  Administered 2024-01-03: 500 mL via OPHTHALMIC

## 2024-01-03 MED ORDER — SODIUM HYALURONATE 23MG/ML IO SOSY
PREFILLED_SYRINGE | INTRAOCULAR | Status: DC | PRN
  Administered 2024-01-03: .6 mL via INTRAOCULAR

## 2024-01-03 MED ORDER — TROPICAMIDE 1 % OP SOLN
1.0000 [drp] | OPHTHALMIC | Status: AC | PRN
  Administered 2024-01-03 (×3): 1 [drp] via OPHTHALMIC

## 2024-01-03 MED ORDER — LACTATED RINGERS IV SOLN: INTRAVENOUS | Status: DC

## 2024-01-03 MED ORDER — ORAL CARE MOUTH RINSE: 15.0000 mL | Freq: Once | OROMUCOSAL | Status: DC

## 2024-01-03 SURGICAL SUPPLY — 12 items
BLADE OPHTHALMIC KNIFE DISP (OPHTHALMIC) IMPLANT
CLOTH BEACON ORANGE TIMEOUT ST (SAFETY) ×1 IMPLANT
EYE SHIELD UNIVERSAL CLEAR (GAUZE/BANDAGES/DRESSINGS) IMPLANT
FEE CATARACT SUITE SIGHTPATH (MISCELLANEOUS) ×1 IMPLANT
GLOVE BIOGEL PI IND STRL 7.0 (GLOVE) ×2 IMPLANT
LENS IOL TECNIS EYHANCE 30.0 (Intraocular Lens) IMPLANT
NDL HYPO 18GX1.5 BLUNT FILL (NEEDLE) ×1 IMPLANT
NEEDLE HYPO 18GX1.5 BLUNT FILL (NEEDLE) ×1 IMPLANT
PAD ARMBOARD POSITIONER FOAM (MISCELLANEOUS) ×1 IMPLANT
SYR TB 1ML LL NO SAFETY (SYRINGE) ×1 IMPLANT
TAPE SURG TRANSPORE 1 IN (GAUZE/BANDAGES/DRESSINGS) IMPLANT
WATER STERILE IRR 250ML POUR (IV SOLUTION) ×1 IMPLANT

## 2024-01-03 NOTE — Interval H&P Note (Signed)
 History and Physical Interval Note:  01/03/2024 9:23 AM  Robert Faulkner  has presented today for surgery, with the diagnosis of nuclear sclerotic cataract, right eye astigmatism, right eye.  The various methods of treatment have been discussed with the patient and family. After consideration of risks, benefits and other options for treatment, the patient has consented to  Procedure(s) with comments: PHACOEMULSIFICATION, CATARACT, WITH IOL INSERTION (Right) - CDE: INSERTION, STENT, DRUG-ELUTING, LACRIMAL CANALICULUS (Right) INCISION, LIMBUS, CORNEA, RELAXING (Right) as a surgical intervention.  The patient's history has been reviewed, patient examined, no change in status, stable for surgery.  I have reviewed the patient's chart and labs.  Questions were answered to the patient's satisfaction.     [name]

## 2024-01-03 NOTE — Op Note (Signed)
 Date of procedure: 01/03/24  Pre-operative diagnosis: Visually significant age-related cataract, Right Eye; Visually Significant Astigmatism, Right Eye (H25.?1)  Post-operative diagnosis:  Visually significant age-related cataract, Right Eye Visually Significant Astigmatism, Right Eye Pain and inflammation after cataract surgery, Right eye  Procedure:  Removal of cataract via phacoemulsification and insertion of intra-ocular lens [employer] DIB00+30.0D into the capsular bag of the Right Eye Creation of Limbal Relaxing Incisions, Right Eye Placement of Dextenza  insert, Right lower eyelid  Attending surgeon: Lynwood LABOR. Vonya Ohalloran, MD, MA  Anesthesia: MAC, Topical Akten   Complications: None  Estimated Blood Loss: <77mL (minimal)  Specimens: None  Implants: As above  Indications:  Visually significant age-related cataract, Right Eye; Visually Significant Astigmatism, Right Eye  Procedure:  The patient was seen and identified in the pre-operative area. The operative eye was identified and dilated.  The operative eye was marked.  Pre-operative toric markers were used to mark the eye at 270 degrees. Topical anesthesia was administered to the operative eye.     The patient was then to the operative suite and placed in the supine position.  A timeout was performed confirming the patient, procedure to be performed, and all other relevant information.   The patient's face was prepped and draped in the usual fashion for intra-ocular surgery.  A lid speculum was placed into the operative eye and the surgical microscope moved into place and focused.  A superotemporal paracentesis was created using a 20 gauge paracentesis blade. Omidria was injected into the anterior chamber. Shugarcaine was injected into the anterior chamber.  Viscoelastic was injected into the anterior chamber.  A temporal clear-corneal main wound incision was created using a 2.68mm microkeratome.  A continuous curvilinear  capsulorrhexis was initiated using an irrigating cystitome and completed using capsulorrhexis forceps.  Hydrodissection and hydrodeliniation were performed.  Viscoelastic was injected into the anterior chamber.  A phacoemulsification handpiece and a chopper as a second instrument were used to remove the nucleus and epinucleus. The irrigation/aspiration handpiece was used to remove any remaining cortical material.   The capsular bag was reinflated with viscoelastic, checked, and found to be intact.  The eye was marked per the pre-op Limbal Relaxing Incision parameters.  The intraocular lens was inserted into the capsular bag and dialed into place. A 500 micron guarded blade was used to create the limbal relaxing incisions at the marked zones from 336-369 degrees.  The irrigation/aspiration handpiece was used to remove any remaining viscoelastic.  The clear corneal wound and paracentesis wounds were then hydrated and checked with Weck-Cels to be watertight. 0.1mL of moxifloxacin was injected into the anterior chamber. The lid-speculum was removed.    The lower punctum was dilated and filled with Provisc. A Dextenza  implant was placed in the lower canaliculus without complication.  The drape was removed. The patient's face was cleaned with a wet and dry 4x4.   A clear shield was taped over the eye. The patient was taken to the post-operative care unit in good condition, having tolerated the procedure well.  Post-Op Instructions: The patient will follow up at [facility] for a same day post-operative evaluation and will receive all other orders and instructions.

## 2024-01-03 NOTE — Transfer of Care (Signed)
 Immediate Anesthesia Transfer of Care Note  Patient: Robert Faulkner  Procedure(s) Performed: PHACOEMULSIFICATION, CATARACT, WITH IOL INSERTION (Right: Eye) INSERTION, STENT, DRUG-ELUTING, LACRIMAL CANALICULUS (Right: Eye) INCISION, LIMBUS, CORNEA, RELAXING (Right: Eye)  Patient Location: PACU  Anesthesia Type:MAC  Level of Consciousness: awake and alert   Airway & Oxygen Therapy: Patient Spontanous Breathing and Patient connected to nasal cannula oxygen  Post-op Assessment: Report given to RN and Post -op Vital signs reviewed and stable  Post vital signs: Reviewed and stable  Last Vitals:  Vitals Value Taken Time  BP 130/68 01/03/24 09:55  Temp 36.5 C 01/03/24 09:55  Pulse 57 01/03/24 09:55  Resp 14 01/03/24 09:55  SpO2 96 % 01/03/24 09:55    Last Pain:  Vitals:   01/03/24 0955  TempSrc: Oral  PainSc: 0-No pain         Complications: No notable events documented.

## 2024-01-03 NOTE — Anesthesia Preprocedure Evaluation (Signed)
Anesthesia Evaluation  Patient identified by MRN, date of birth, ID band Patient awake    Reviewed: Allergy & Precautions, H&P , NPO status , Patient's Chart, lab work & pertinent test results, reviewed documented beta blocker date and time   Airway Mallampati: II  TM Distance: >3 FB Neck ROM: full    Dental no notable dental hx.    Pulmonary neg pulmonary ROS, former smoker   Pulmonary exam normal breath sounds clear to auscultation       Cardiovascular Exercise Tolerance: Good hypertension, negative cardio ROS  Rhythm:regular Rate:Normal     Neuro/Psych negative neurological ROS  negative psych ROS   GI/Hepatic negative GI ROS, Neg liver ROS,,,  Endo/Other  negative endocrine ROSdiabetes    Renal/GU negative Renal ROS  negative genitourinary   Musculoskeletal   Abdominal   Peds  Hematology negative hematology ROS (+)   Anesthesia Other Findings   Reproductive/Obstetrics negative OB ROS                             Anesthesia Physical Anesthesia Plan  ASA: 2  Anesthesia Plan: MAC   Post-op Pain Management:    Induction:   PONV Risk Score and Plan:   Airway Management Planned:   Additional Equipment:   Intra-op Plan:   Post-operative Plan:   Informed Consent: I have reviewed the patients History and Physical, chart, labs and discussed the procedure including the risks, benefits and alternatives for the proposed anesthesia with the patient or authorized representative who has indicated his/her understanding and acceptance.     Dental Advisory Given  Plan Discussed with: CRNA  Anesthesia Plan Comments:        Anesthesia Quick Evaluation

## 2024-01-03 NOTE — Discharge Instructions (Addendum)
 Please discharge patient when stable, will follow up today with Dr. June Leap at the [facility] office immediately following discharge.  Leave shield in place until visit.  All paperwork with discharge instructions will be given at the office.  [facility] Address:  [address]  [place], [place] [address]

## 2024-01-04 ENCOUNTER — Encounter (HOSPITAL_COMMUNITY): Payer: Self-pay | Admitting: Ophthalmology

## 2024-01-04 NOTE — Anesthesia Postprocedure Evaluation (Signed)
 Anesthesia Post Note  Patient: Carlin Crete  Procedure(s) Performed: PHACOEMULSIFICATION, CATARACT, WITH IOL INSERTION (Right: Eye) INSERTION, STENT, DRUG-ELUTING, LACRIMAL CANALICULUS (Right: Eye) INCISION, LIMBUS, CORNEA, RELAXING (Right: Eye)  Patient location during evaluation: Phase II Anesthesia Type: MAC Level of consciousness: awake Pain management: pain level controlled Vital Signs Assessment: post-procedure vital signs reviewed and stable Respiratory status: spontaneous breathing and respiratory function stable Cardiovascular status: blood pressure returned to baseline and stable Postop Assessment: no headache and no apparent nausea or vomiting Anesthetic complications: no Comments: Late entry   No notable events documented.   Last Vitals:  Vitals:   01/03/24 0843 01/03/24 0955  BP: 138/76 130/68  Pulse: (!) 57 (!) 57  Resp: 14 14  Temp: 36.7 C 36.5 C  SpO2: 98% 96%    Last Pain:  Vitals:   01/03/24 0955  TempSrc: Oral  PainSc: 0-No pain                 Yvonna JINNY Bosworth

## 2024-01-10 DIAGNOSIS — H2512 Age-related nuclear cataract, left eye: Secondary | ICD-10-CM | POA: Diagnosis not present

## 2024-01-12 ENCOUNTER — Encounter (HOSPITAL_COMMUNITY)
Admission: RE | Admit: 2024-01-12 | Discharge: 2024-01-12 | Disposition: A | Discharge status: Home / Self Care | Source: Ambulatory Visit | Attending: Ophthalmology | Admitting: Ophthalmology

## 2024-01-12 NOTE — H&P (Signed)
 Surgical History & Physical  Patient Name: Robert Faulkner  DOB: Aug 04, 1945  Surgery: Cataract extraction with intraocular lens implant phacoemulsification; Left Eye Surgeon: Lynwood Hermann MD Surgery Date: 01/17/2024 Pre-Op Date: 01/06/2024  HPI: A 17 Yr. old male patient returning for 3 day post op OD/Pre OP OS. OD: Pt states: it's doing great! I don't even need my glasses on. I got up this morning and it hurt my eyes so I took them off. My eye sight is improving I can tell everyday, it gets better. OS: Pt c/o decreased vision in OS despite using glasses. He still c/o glare from bright lights, and excessive sunlight. The pt still c/o poor night vision and difficulty seeing captions on television. This is negatively affecting the patient's quality of life and the patient is unable to function adequately in life with the current level of vision. HPI Completed by Dr. Lynwood Hermann  Medical History: Cataracts  Arthritis High Blood Pressure  Review of Systems Cardiovascular High Blood Pressure, high cholesterol Musculoskeletal arthritis All recorded systems are negative except as noted above.  Social Never smoked   Medication Prednisolone-moxiflox-bromfen, Propranolol , Meclizine , Rosuvastatin   Sx/Procedures Pterygiectomy, Phaco c IOL OD  Drug Allergies  NKDA  History & Physical: Heent: cataract NECK: supple without bruits LUNGS: lungs clear to auscultation CV: regular rate and rhythm Abdomen: soft and non-tender  Impression & Plan: Assessment: 1. CATARACT EXTRACTION STATUS; Right Eye (Z98.41) 2. NUCLEAR SCLEROSIS AGE RELATED; , Left Eye (H25.12)  Plan: 1. 3 days after cataract surgery. Doing well with improved vision and normal eye pressure. Call with any problems or concerns. Continue Pred-Moxi-Brom 3x/day for 4 days, then 2x/day for 1 more week.,  2. Cataract accounts for the patient's decreased vision. This visual impairment is not correctable with a tolerable  change in glasses or contact lenses. Cataract surgery with an implantation of a new lens should significantly improve the visual and functional status of the patient. Recommend phacoemulsification with intraocular lens. Discussed all risks, benefits, alternatives, and potential complications. Discussed the procedures and recovery. The patient desires to have surgery. A-scan/Biometry ordered and will be performed for intraocular lens calculations. The surgery will be performed in order to improve vision for driving, reading, and for eye examinations. Recommend Dextenza  for post-operative pain and inflammation. Educational materials provided: Cataract. History of corneal refractive Surgery: None History of Previous Ocular Surgery (PPV, other): Pterygiectomy OS History of ocular trauma: None No current contact lens use. Pupil Status: Dilates poorly - shugarcaine or Lidocaine +Omidira by protocol, Malyugin Ring Left Eye. Recommend Limbal Relaxing Incision to treat astigmatism. Pseudoexfoliation. Veteran no charge LRI.

## 2024-01-17 ENCOUNTER — Ambulatory Visit (HOSPITAL_COMMUNITY): Admitting: Anesthesiology

## 2024-01-17 ENCOUNTER — Ambulatory Visit (HOSPITAL_COMMUNITY)
Admission: RE | Admit: 2024-01-17 | Discharge: 2024-01-17 | Disposition: A | Discharge status: Home / Self Care | Attending: Ophthalmology | Admitting: Ophthalmology

## 2024-01-17 ENCOUNTER — Encounter (HOSPITAL_COMMUNITY): Payer: Self-pay | Admitting: Ophthalmology

## 2024-01-17 ENCOUNTER — Other Ambulatory Visit: Payer: Self-pay

## 2024-01-17 ENCOUNTER — Encounter (HOSPITAL_COMMUNITY)
Admission: RE | Disposition: A | Discharge status: Home / Self Care | Payer: Self-pay | Source: Home / Self Care | Attending: Ophthalmology

## 2024-01-17 DIAGNOSIS — H52222 Regular astigmatism, left eye: Secondary | ICD-10-CM

## 2024-01-17 DIAGNOSIS — Z9841 Cataract extraction status, right eye: Secondary | ICD-10-CM | POA: Insufficient documentation

## 2024-01-17 DIAGNOSIS — Z87891 Personal history of nicotine dependence: Secondary | ICD-10-CM | POA: Insufficient documentation

## 2024-01-17 DIAGNOSIS — H5712 Ocular pain, left eye: Secondary | ICD-10-CM | POA: Insufficient documentation

## 2024-01-17 DIAGNOSIS — H2512 Age-related nuclear cataract, left eye: Secondary | ICD-10-CM | POA: Insufficient documentation

## 2024-01-17 DIAGNOSIS — H52202 Unspecified astigmatism, left eye: Secondary | ICD-10-CM | POA: Insufficient documentation

## 2024-01-17 DIAGNOSIS — E119 Type 2 diabetes mellitus without complications: Secondary | ICD-10-CM

## 2024-01-17 DIAGNOSIS — I10 Essential (primary) hypertension: Secondary | ICD-10-CM | POA: Insufficient documentation

## 2024-01-17 DIAGNOSIS — E1136 Type 2 diabetes mellitus with diabetic cataract: Secondary | ICD-10-CM | POA: Diagnosis not present

## 2024-01-17 HISTORY — PX: CATARACT EXTRACTION W/PHACO: SHX586

## 2024-01-17 HISTORY — PX: INSERTION, STENT, DRUG-ELUTING, LACRIMAL CANALICULUS: SHX7453

## 2024-01-17 SURGERY — PHACOEMULSIFICATION, CATARACT, WITH IOL INSERTION
Anesthesia: Monitor Anesthesia Care | Site: Eye | Laterality: Left

## 2024-01-17 MED ORDER — SODIUM HYALURONATE 23MG/ML IO SOSY
PREFILLED_SYRINGE | INTRAOCULAR | Status: DC | PRN
Start: 2024-01-17 — End: 2024-01-17
  Administered 2024-01-17: .6 mL via INTRAOCULAR

## 2024-01-17 MED ORDER — PHENYLEPHRINE HCL 2.5 % OP SOLN
1.0000 [drp] | OPHTHALMIC | Status: AC | PRN
  Administered 2024-01-17 (×3): 1 [drp] via OPHTHALMIC

## 2024-01-17 MED ORDER — SODIUM HYALURONATE 10 MG/ML IO SOLUTION
PREFILLED_SYRINGE | INTRAOCULAR | Status: DC | PRN
  Administered 2024-01-17: .85 mL via INTRAOCULAR

## 2024-01-17 MED ORDER — POVIDONE-IODINE 5 % OP SOLN
OPHTHALMIC | Status: DC | PRN
  Administered 2024-01-17: 1 via OPHTHALMIC

## 2024-01-17 MED ORDER — DEXAMETHASONE 0.4 MG OP INST
VAGINAL_INSERT | OPHTHALMIC | Status: DC | PRN
Start: 2024-01-17 — End: 2024-01-17
  Administered 2024-01-17: .4 mg via OPHTHALMIC

## 2024-01-17 MED ORDER — DEXAMETHASONE 0.4 MG OP INST
VAGINAL_INSERT | OPHTHALMIC | Status: AC
  Filled 2024-01-17: qty 1

## 2024-01-17 MED ORDER — MOXIFLOXACIN HCL 5 MG/ML IO SOLN
INTRAOCULAR | Status: DC | PRN
  Administered 2024-01-17: .2 mL via INTRACAMERAL

## 2024-01-17 MED ORDER — TROPICAMIDE 1 % OP SOLN
1.0000 [drp] | OPHTHALMIC | Status: AC | PRN
  Administered 2024-01-17 (×3): 1 [drp] via OPHTHALMIC

## 2024-01-17 MED ORDER — LIDOCAINE HCL (PF) 1 % IJ SOLN
INTRAMUSCULAR | Status: DC | PRN
Start: 2024-01-17 — End: 2024-01-17
  Administered 2024-01-17: 1 mL

## 2024-01-17 MED ORDER — STERILE WATER FOR IRRIGATION IR SOLN
Status: DC | PRN
Start: 2024-01-17 — End: 2024-01-17
  Administered 2024-01-17: 1

## 2024-01-17 MED ORDER — PHENYLEPHRINE-KETOROLAC 1-0.3 % IO SOLN
INTRAOCULAR | Status: DC | PRN
  Administered 2024-01-17: 500 mL via OPHTHALMIC

## 2024-01-17 MED ORDER — TETRACAINE HCL 0.5 % OP SOLN
1.0000 [drp] | OPHTHALMIC | Status: AC | PRN
  Administered 2024-01-17 (×3): 1 [drp] via OPHTHALMIC

## 2024-01-17 MED ORDER — LACTATED RINGERS IV SOLN: INTRAVENOUS | Status: DC

## 2024-01-17 MED ORDER — BSS IO SOLN
INTRAOCULAR | Status: DC | PRN
Start: 2024-01-17 — End: 2024-01-17
  Administered 2024-01-17: 15 mL via INTRAOCULAR

## 2024-01-17 MED ORDER — LIDOCAINE HCL 3.5 % OP GEL: 1.0000 | Freq: Once | OPHTHALMIC | Status: DC

## 2024-01-17 SURGICAL SUPPLY — 11 items
BLADE OPHTHALMIC KNIFE DISP (OPHTHALMIC) IMPLANT
CLOTH BEACON ORANGE TIMEOUT ST (SAFETY) ×1 IMPLANT
EYE SHIELD UNIVERSAL CLEAR (GAUZE/BANDAGES/DRESSINGS) IMPLANT
FEE CATARACT SUITE SIGHTPATH (MISCELLANEOUS) ×1 IMPLANT
GLOVE BIOGEL PI IND STRL 7.0 (GLOVE) ×2 IMPLANT
LENS IOL TECNIS EYHANCE 29.0 (Intraocular Lens) IMPLANT
NDL HYPO 18GX1.5 BLUNT FILL (NEEDLE) ×1 IMPLANT
PAD ARMBOARD POSITIONER FOAM (MISCELLANEOUS) ×1 IMPLANT
SYR TB 1ML LL NO SAFETY (SYRINGE) ×1 IMPLANT
TAPE SURG TRANSPORE 1 IN (GAUZE/BANDAGES/DRESSINGS) IMPLANT
WATER STERILE IRR 250ML POUR (IV SOLUTION) ×1 IMPLANT

## 2024-01-17 NOTE — Anesthesia Postprocedure Evaluation (Signed)
 Anesthesia Post Note  Patient: Carlin Crete  Procedure(s) Performed: PHACOEMULSIFICATION, CATARACT, WITH IOL INSERTION (Left: Eye) INSERTION, STENT, DRUG-ELUTING, LACRIMAL CANALICULUS (Left: Eye) INCISION, LIMBUS, CORNEA, RELAXING (Left: Eye)  Patient location during evaluation: Phase II Anesthesia Type: MAC Level of consciousness: awake Pain management: pain level controlled Vital Signs Assessment: post-procedure vital signs reviewed and stable Respiratory status: spontaneous breathing and respiratory function stable Cardiovascular status: blood pressure returned to baseline and stable Postop Assessment: no headache and no apparent nausea or vomiting Anesthetic complications: no Comments: Late entry   No notable events documented.   Last Vitals:  Vitals:   01/17/24 1139 01/17/24 1355  BP: (!) 166/81 (!) 191/86  Pulse: 100 (!) 58  Resp: 16 18  Temp: 36.9 C 36.5 C  SpO2: 98% 98%    Last Pain:  Vitals:   01/17/24 1355  TempSrc: Oral  PainSc: 0-No pain                 Yvonna JINNY Bosworth

## 2024-01-17 NOTE — Op Note (Signed)
 Date of procedure: 01/17/24  Pre-operative diagnosis: Visually significant age-related nuclear cataract, Left Eye; Visually Significant Astigmatism, Left Eye (H25.12)  Post-operative diagnosis:  Visually significant age-related cataract, Left Eye Visually Significant Astigmatism, Left Eye Pain and inflammation after cataract surgery, Left eye  Procedure:  Removal of cataract via phacoemulsification and insertion of intra-ocular lens Johnson and Johnson DIB00+29.0D into the capsular bag of the Left Eye Creation of Limbal Relaxing Incisions, Left Eye Placement of Dextenza  insert, Left lower eyelid  Attending surgeon: Lynwood LABOR. Talley Casco, MD, MA  Anesthesia: MAC, Topical Akten   Complications: None  Estimated Blood Loss: <19mL (minimal)  Specimens: None  Implants: As above  Indications:  Visually significant age-related cataract, Left Eye; Visually Significant Astigmatism, Left Eye  Procedure:  The patient was seen and identified in the pre-operative area. The operative eye was identified and dilated.  The operative eye was marked.  Pre-operative toric markers were used to mark the eye at 270 degrees. Topical anesthesia was administered to the operative eye.     The patient was then to the operative suite and placed in the supine position.  A timeout was performed confirming the patient, procedure to be performed, and all other relevant information.   The patient's face was prepped and draped in the usual fashion for intra-ocular surgery.  A lid speculum was placed into the operative eye and the surgical microscope moved into place and focused.  A superotemporal paracentesis was created using a 20 gauge paracentesis blade. Omidria  was injected into the anterior chamber. Shugarcaine was injected into the anterior chamber.  Viscoelastic was injected into the anterior chamber.  A temporal clear-corneal main wound incision was created using a 2.55mm microkeratome.  A continuous curvilinear  capsulorrhexis was initiated using an irrigating cystitome and completed using capsulorrhexis forceps.  Hydrodissection and hydrodeliniation were performed.  Viscoelastic was injected into the anterior chamber.  A phacoemulsification handpiece and a chopper as a second instrument were used to remove the nucleus and epinucleus. The irrigation/aspiration handpiece was used to remove any remaining cortical material.   The capsular bag was reinflated with viscoelastic, checked, and found to be intact.  The eye was marked per the pre-op Limbal Relaxing Incision parameters (61-104, and 241-284 degrees).  The intraocular lens was inserted into the capsular bag and dialed into place. A 500 micron guarded blade was used to create the limbal relaxing incisions at the marked zones.  The irrigation/aspiration handpiece was used to remove any remaining viscoelastic.  The clear corneal wound and paracentesis wounds were then hydrated and checked with Weck-Cels to be watertight. 0.1mL of moxifloxacin  was injected into the anterior chamber. The lid-speculum was removed.    The lower punctum was dilated and filled with Provisc. A Dextenza  implant was placed in the lower canaliculus without complication.  The drape was removed. The patient's face was cleaned with a wet and dry 4x4.   A clear shield was taped over the eye. The patient was taken to the post-operative care unit in good condition, having tolerated the procedure well.  Post-Op Instructions: The patient will follow up at [facility] for a same day post-operative evaluation and will receive all other orders and instructions.

## 2024-01-17 NOTE — Interval H&P Note (Signed)
 History and Physical Interval Note:  01/17/2024 1:26 PM  Robert Faulkner  has presented today for surgery, with the diagnosis of nuclear sclerotic cataract, left eye regular astigmitism, left eye.  The various methods of treatment have been discussed with the patient and family. After consideration of risks, benefits and other options for treatment, the patient has consented to  Procedure(s) with comments: PHACOEMULSIFICATION, CATARACT, WITH IOL INSERTION (Left) - CDE: INSERTION, STENT, DRUG-ELUTING, LACRIMAL CANALICULUS (Left) INCISION, LIMBUS, CORNEA, RELAXING (Left) as a surgical intervention.  The patient's history has been reviewed, patient examined, no change in status, stable for surgery.  I have reviewed the patient's chart and labs.  Questions were answered to the patient's satisfaction.     [name]

## 2024-01-17 NOTE — Transfer of Care (Signed)
 Immediate Anesthesia Transfer of Care Note  Patient: Robert Faulkner  Procedure(s) Performed: PHACOEMULSIFICATION, CATARACT, WITH IOL INSERTION (Left: Eye) INSERTION, STENT, DRUG-ELUTING, LACRIMAL CANALICULUS (Left: Eye) INCISION, LIMBUS, CORNEA, RELAXING (Left: Eye)  Patient Location: PACU  Anesthesia Type:MAC  Level of Consciousness: awake and alert   Airway & Oxygen Therapy: Patient Spontanous Breathing and Patient connected to nasal cannula oxygen  Post-op Assessment: Report given to RN and Post -op Vital signs reviewed and stable  Post vital signs: Reviewed and stable  Last Vitals:  Vitals Value Taken Time  BP 191/86 01/17/24 13:55  Temp 36.5 C 01/17/24 13:55  Pulse 58 01/17/24 13:55  Resp 18 01/17/24 13:55  SpO2 98 % 01/17/24 13:55    Last Pain:  Vitals:   01/17/24 1355  TempSrc: Oral  PainSc: 0-No pain      Patients Stated Pain Goal: 4 (01/17/24 1139)  Complications: No notable events documented.

## 2024-01-17 NOTE — Discharge Instructions (Signed)
 Please discharge patient when stable, will follow up today with Dr. June Leap at the [facility] office immediately following discharge.  Leave shield in place until visit.  All paperwork with discharge instructions will be given at the office.  [facility] Address:  [address]  [place], [place] [address]

## 2024-01-17 NOTE — Anesthesia Preprocedure Evaluation (Signed)
Anesthesia Evaluation  Patient identified by MRN, date of birth, ID band Patient awake    Reviewed: Allergy & Precautions, H&P , NPO status , Patient's Chart, lab work & pertinent test results, reviewed documented beta blocker date and time   Airway Mallampati: II  TM Distance: >3 FB Neck ROM: full    Dental no notable dental hx.    Pulmonary neg pulmonary ROS, former smoker   Pulmonary exam normal breath sounds clear to auscultation       Cardiovascular Exercise Tolerance: Good hypertension, negative cardio ROS  Rhythm:regular Rate:Normal     Neuro/Psych negative neurological ROS  negative psych ROS   GI/Hepatic negative GI ROS, Neg liver ROS,,,  Endo/Other  negative endocrine ROSdiabetes, Type 2    Renal/GU negative Renal ROS  negative genitourinary   Musculoskeletal   Abdominal   Peds  Hematology negative hematology ROS (+)   Anesthesia Other Findings   Reproductive/Obstetrics negative OB ROS                             Anesthesia Physical Anesthesia Plan  ASA: 2  Anesthesia Plan: MAC   Post-op Pain Management:    Induction:   PONV Risk Score and Plan:   Airway Management Planned:   Additional Equipment:   Intra-op Plan:   Post-operative Plan:   Informed Consent: I have reviewed the patients History and Physical, chart, labs and discussed the procedure including the risks, benefits and alternatives for the proposed anesthesia with the patient or authorized representative who has indicated his/her understanding and acceptance.     Dental Advisory Given  Plan Discussed with: CRNA  Anesthesia Plan Comments:        Anesthesia Quick Evaluation

## 2024-01-18 ENCOUNTER — Encounter (HOSPITAL_COMMUNITY): Payer: Self-pay | Admitting: Ophthalmology

## 2024-01-18 LAB — GLUCOSE, CAPILLARY: Glucose-Capillary: 105 mg/dL — ABNORMAL HIGH (ref 70–99)

## 2024-05-01 ENCOUNTER — Ambulatory Visit: Payer: Self-pay | Admitting: Family Medicine

## 2024-05-09 ENCOUNTER — Ambulatory Visit: Payer: Self-pay

## 2024-08-21 ENCOUNTER — Encounter (INDEPENDENT_AMBULATORY_CARE_PROVIDER_SITE_OTHER): Admitting: Ophthalmology

## 2024-09-18 ENCOUNTER — Ambulatory Visit: Admitting: Nurse Practitioner
